# Patient Record
Sex: Male | Born: 1940
Health system: Southern US, Community
[De-identification: ages and names within clinical notes are randomized; demographics above are authoritative.]

## PROBLEM LIST (undated history)

## (undated) ENCOUNTER — Ambulatory Visit: Admission: EM | Payer: Medicare Other

## (undated) DIAGNOSIS — E785 Hyperlipidemia, unspecified: Secondary | ICD-10-CM

## (undated) DIAGNOSIS — E1159 Type 2 diabetes mellitus with other circulatory complications: Secondary | ICD-10-CM

## (undated) DIAGNOSIS — M47819 Spondylosis without myelopathy or radiculopathy, site unspecified: Secondary | ICD-10-CM

## (undated) DIAGNOSIS — I739 Peripheral vascular disease, unspecified: Secondary | ICD-10-CM

## (undated) DIAGNOSIS — N529 Male erectile dysfunction, unspecified: Secondary | ICD-10-CM

## (undated) DIAGNOSIS — I10 Essential (primary) hypertension: Secondary | ICD-10-CM

## (undated) DIAGNOSIS — L570 Actinic keratosis: Secondary | ICD-10-CM

## (undated) DIAGNOSIS — M1990 Unspecified osteoarthritis, unspecified site: Secondary | ICD-10-CM

## (undated) DIAGNOSIS — I2510 Atherosclerotic heart disease of native coronary artery without angina pectoris: Principal | ICD-10-CM

## (undated) HISTORY — DX: Male erectile dysfunction, unspecified: N52.9

## (undated) HISTORY — DX: Hyperlipidemia, unspecified: E78.5

## (undated) HISTORY — DX: Atherosclerotic heart disease of native coronary artery without angina pectoris: I25.10

## (undated) HISTORY — DX: Spondylosis without myelopathy or radiculopathy, site unspecified: M47.819

## (undated) HISTORY — DX: Peripheral vascular disease, unspecified: I73.9

## (undated) HISTORY — DX: Actinic keratosis: L57.0

## (undated) HISTORY — DX: Essential (primary) hypertension: I10

## (undated) HISTORY — DX: Type 2 diabetes mellitus with other circulatory complications: E11.59

---

## 2010-02-16 DIAGNOSIS — E1159 Type 2 diabetes mellitus with other circulatory complications: Secondary | ICD-10-CM

## 2010-02-16 HISTORY — DX: Type 2 diabetes mellitus with other circulatory complications: E11.59

## 2010-08-17 DIAGNOSIS — I2510 Atherosclerotic heart disease of native coronary artery without angina pectoris: Secondary | ICD-10-CM

## 2010-08-17 HISTORY — DX: Atherosclerotic heart disease of native coronary artery without angina pectoris: I25.10

## 2010-08-17 HISTORY — PX: US ECHOCARDIOGRAPHY: HXRAD669

## 2010-08-17 HISTORY — PX: CORONARY ARTERY BYPASS GRAFT: SHX141

## 2010-08-17 HISTORY — PX: ESOPHAGOGASTRODUODENOSCOPY: SHX1529

## 2010-08-22 ENCOUNTER — Ambulatory Visit: Payer: Medicare Other | Admitting: Internal Medicine

## 2010-09-02 LAB — BASIC METABOLIC PANEL
BUN: 7 mg/dL (ref 4–21)
CO2: 26 mmol/L
Calcium: 7.9 mg/dL
Creat: 0.8
Glucose: 121
Sodium: 134 mmol/L — AB (ref 137–147)

## 2010-09-19 ENCOUNTER — Ambulatory Visit (INDEPENDENT_AMBULATORY_CARE_PROVIDER_SITE_OTHER): Payer: Medicare Other | Admitting: Family Medicine

## 2010-09-19 ENCOUNTER — Encounter: Payer: Self-pay | Admitting: Family Medicine

## 2010-09-19 VITALS — BP 110/68 | HR 100 | Temp 98.0°F | Ht 68.0 in | Wt 179.0 lb

## 2010-09-19 DIAGNOSIS — I2510 Atherosclerotic heart disease of native coronary artery without angina pectoris: Secondary | ICD-10-CM | POA: Insufficient documentation

## 2010-09-19 DIAGNOSIS — E119 Type 2 diabetes mellitus without complications: Secondary | ICD-10-CM

## 2010-09-19 MED ORDER — METOPROLOL TARTRATE 12.5 MG HALF TABLET: 12.5000 mg | ORAL_TABLET | Freq: Two times a day (BID) | ORAL | Status: DC

## 2010-09-19 NOTE — Assessment & Plan Note (Addendum)
Recent CABG, healing wonderfully. Denies h/o HTN, actually endorses dizziness with 25mg  metoprolol bid so has been taking qd.  Will change to 12.5mg  metoprolol tart bid, advised to monitor BPs and call us if dizziness continued or staying too low. on lipitor 40mg  qhs.  Anticipate some component of dyslipidemia, will request and review records.  Doesn't sound like has ischemic CM/CHF component. No further changes today.

## 2010-09-19 NOTE — Progress Notes (Addendum)
Subjective:    Patient ID: William Walton, male    DOB: 1940/10/15, 70 y.o.   MRN: [id number]  HPI CC: new pt establish  [name] presents today to establish care.  Hasn't had regular doctor previously since [employer].  July 4th 2012 had MI, seen at [facility] ER and transferred to [facility] for 5v CABG.  3d later went home.  Found to be diabetic as well.  Doing great from pain standpoint, no chest pain, states had checkup again - EKG, blood work, CXR all good.  Has recovered very well.  Echo done while in hospital, told very little damage to heart.  Seen by Dr. Alan Ripper, to see again Aug 28.  On metoprolol 25mg , was told to take tid but caused bp to run too low so switched to bid, still dizzy, taking qd now and tolerating well.  DM - found to have during hospitalization.  Checks sugars twice daily, brings log and reviewed - am fasting 170s, pm prior to dinner 120-170.  followed by endocrinologist at [facility].  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  States had blood in stool at hospital, had EGD and told normal.  Preventative: No recent physical. No colonoscopy.  No prostate check.  Father had either prostate or bladder cancer.  States had prostate checked in [employer] and always fine. Tetanus done in hospital. No pneumonia or shingles shot. States flu shots give him the flu.  Medications and allergies reviewed and updated in chart. There is no problem list on file for this patient.  Past Medical History  Diagnosis Date  . MI (myocardial infarction) 2012  . Diabetes mellitus type II 2012   Past Surgical History  Procedure Date  . Coronary artery bypass graft 2012    5v   History  Substance Use Topics  . Smoking status: Former Smoker    Quit date: 02/16/1966  . Smokeless tobacco: Never Used  . Alcohol Use: No   Family History  Problem Relation Age of Onset  . Cancer Father 6    died of prostate or bladder CA, pt unsure  . Coronary artery disease Brother 59   bypass  . Hyperlipidemia Brother   . Hypertension Brother   . Coronary artery disease Maternal Uncle 59    MI  . Diabetes Neg Hx   . Stroke Neg Hx    No Known Allergies No current outpatient prescriptions on file prior to visit.   Review of Systems  Constitutional: Negative for fever, chills, activity change, appetite change, fatigue and unexpected weight change.  HENT: Negative for hearing loss and neck pain.   Eyes: Negative for visual disturbance.  Respiratory: Negative for cough, chest tightness, shortness of breath and wheezing.   Cardiovascular: Negative for chest pain, palpitations and leg swelling.  Gastrointestinal: Negative for nausea, vomiting, abdominal pain, diarrhea, constipation, blood in stool and abdominal distention.  Genitourinary: Negative for hematuria and difficulty urinating.  Musculoskeletal: Negative for myalgias and arthralgias.  Skin: Negative for rash.  Neurological: Negative for dizziness, seizures, syncope and headaches.  Hematological: Does not bruise/bleed easily.  Psychiatric/Behavioral: Negative for dysphoric mood. The patient is not nervous/anxious.        Objective:   Physical Exam  Nursing note and vitals reviewed. Constitutional: He is oriented to person, place, and time. He appears well-developed and well-nourished. No distress.  HENT:  Head: Normocephalic and atraumatic.  Right Ear: External ear normal.  Left Ear: External ear normal.  Nose: Nose normal.  Mouth/Throat: Oropharynx  is clear and moist.  Eyes: Conjunctivae and EOM are normal. Pupils are equal, round, and reactive to light.  Neck: Normal range of motion. Neck supple. No thyromegaly present.  Cardiovascular: Normal rate, regular rhythm, normal heart sounds and intact distal pulses.   No murmur heard. Pulses:      Radial pulses are 2+ on the right side, and 2+ on the left side.  Pulmonary/Chest: Effort normal and breath sounds normal. No respiratory distress. He has no  wheezes. He has no rales.       Midline healing scar from CABG, no erythema, induration  Abdominal: Soft. Bowel sounds are normal. He exhibits no distension and no mass. There is no tenderness. There is no rebound and no guarding.  Musculoskeletal: Normal range of motion. He exhibits no edema.  Lymphadenopathy:    He has no cervical adenopathy.  Neurological: He is alert and oriented to person, place, and time.       CN grossly intact, station and gait intact  Skin: Skin is warm and dry. No rash noted.  Psychiatric: He has a normal mood and affect. His behavior is normal. Judgment and thought content normal.          Assessment & Plan:

## 2010-09-19 NOTE — Assessment & Plan Note (Addendum)
Blood sugars above goal, recently started novolin 70/30, followed by endo. Will request records. Had tetanus shot at hospital. Due for pna/shingles at CPE. Provided with sheet on diabetes care.

## 2010-09-19 NOTE — Patient Instructions (Addendum)
Cut metoprolol 25mg  in half and take 1/2 pill twice daily.  Keep eye on blood pressure and let me know if running too low or dizziness coming back. You're due for physical so return at your convenience early sept or late august. I will request records from other providers and recent hospital stay and review. Check with insurance company about shingles shot and coverage and where they prefer you have it administered. Good to meet you today, call us with questions.

## 2010-09-29 ENCOUNTER — Encounter: Payer: Self-pay | Admitting: Family Medicine

## 2010-09-30 ENCOUNTER — Ambulatory Visit: Payer: Medicare Other

## 2010-10-05 ENCOUNTER — Encounter: Payer: Self-pay | Admitting: Family Medicine

## 2010-10-06 ENCOUNTER — Encounter: Payer: Self-pay | Admitting: Family Medicine

## 2010-10-08 ENCOUNTER — Other Ambulatory Visit: Payer: Self-pay | Admitting: Family Medicine

## 2010-10-08 DIAGNOSIS — Z125 Encounter for screening for malignant neoplasm of prostate: Secondary | ICD-10-CM

## 2010-10-08 DIAGNOSIS — E119 Type 2 diabetes mellitus without complications: Secondary | ICD-10-CM

## 2010-10-08 DIAGNOSIS — I2510 Atherosclerotic heart disease of native coronary artery without angina pectoris: Secondary | ICD-10-CM

## 2010-10-09 ENCOUNTER — Encounter: Payer: Self-pay | Admitting: Family Medicine

## 2010-10-17 ENCOUNTER — Other Ambulatory Visit (INDEPENDENT_AMBULATORY_CARE_PROVIDER_SITE_OTHER): Payer: Medicare Other

## 2010-10-17 DIAGNOSIS — Z125 Encounter for screening for malignant neoplasm of prostate: Secondary | ICD-10-CM

## 2010-10-17 DIAGNOSIS — I2510 Atherosclerotic heart disease of native coronary artery without angina pectoris: Secondary | ICD-10-CM

## 2010-10-17 DIAGNOSIS — E119 Type 2 diabetes mellitus without complications: Secondary | ICD-10-CM

## 2010-10-17 LAB — COMPREHENSIVE METABOLIC PANEL
ALT: 14 U/L (ref 0–53)
BUN: 13 mg/dL (ref 6–23)
CO2: 26 mEq/L (ref 19–32)
Calcium: 8.9 mg/dL (ref 8.4–10.5)
Chloride: 107 mEq/L (ref 96–112)
Creatinine, Ser: 0.9 mg/dL (ref 0.4–1.5)
GFR: 92.46 mL/min (ref >60.00)
Glucose, Bld: 140 mg/dL — ABNORMAL HIGH (ref 70–99)
Total Bilirubin: 0.7 mg/dL (ref 0.3–1.2)

## 2010-10-17 LAB — TSH: TSH: 1.57 u[IU]/mL (ref 0.35–5.50)

## 2010-10-17 LAB — LIPID PANEL
Cholesterol: 127 mg/dL (ref 0–200)
HDL: 28.2 mg/dL — ABNORMAL LOW (ref >39.00)
Triglycerides: 109 mg/dL (ref 0.0–149.0)

## 2010-10-18 ENCOUNTER — Ambulatory Visit: Payer: Medicare Other

## 2010-10-21 ENCOUNTER — Encounter: Payer: Self-pay | Admitting: Family Medicine

## 2010-10-21 ENCOUNTER — Ambulatory Visit (INDEPENDENT_AMBULATORY_CARE_PROVIDER_SITE_OTHER): Payer: Medicare Other | Admitting: Family Medicine

## 2010-10-21 VITALS — BP 130/80 | HR 96 | Temp 98.1°F | Ht 68.0 in | Wt 184.2 lb

## 2010-10-21 DIAGNOSIS — I10 Essential (primary) hypertension: Secondary | ICD-10-CM

## 2010-10-21 DIAGNOSIS — I2510 Atherosclerotic heart disease of native coronary artery without angina pectoris: Secondary | ICD-10-CM

## 2010-10-21 DIAGNOSIS — Z1211 Encounter for screening for malignant neoplasm of colon: Secondary | ICD-10-CM

## 2010-10-21 DIAGNOSIS — E119 Type 2 diabetes mellitus without complications: Secondary | ICD-10-CM

## 2010-10-21 DIAGNOSIS — Z0000 Encounter for general adult medical examination without abnormal findings: Secondary | ICD-10-CM | POA: Insufficient documentation

## 2010-10-21 DIAGNOSIS — N529 Male erectile dysfunction, unspecified: Secondary | ICD-10-CM

## 2010-10-21 DIAGNOSIS — E1169 Type 2 diabetes mellitus with other specified complication: Secondary | ICD-10-CM | POA: Insufficient documentation

## 2010-10-21 DIAGNOSIS — Z0110 Encounter for examination of ears and hearing without abnormal findings: Secondary | ICD-10-CM

## 2010-10-21 DIAGNOSIS — Z125 Encounter for screening for malignant neoplasm of prostate: Secondary | ICD-10-CM

## 2010-10-21 DIAGNOSIS — E785 Hyperlipidemia, unspecified: Secondary | ICD-10-CM

## 2010-10-21 DIAGNOSIS — Z0100 Encounter for examination of eyes and vision without abnormal findings: Secondary | ICD-10-CM

## 2010-10-21 MED ORDER — METOPROLOL TARTRATE 25 MG PO TABS: 12.5000 mg | ORAL_TABLET | Freq: Two times a day (BID) | ORAL | Status: DC

## 2010-10-21 MED ORDER — SILDENAFIL CITRATE 50 MG PO TABS: 50.0000 mg | ORAL_TABLET | Freq: Every day | ORAL | Status: DC | PRN

## 2010-10-21 MED ORDER — ZOSTER VACCINE LIVE 19400 UNT/0.65ML ~~LOC~~ SOLR: 0.6500 mL | Freq: Once | SUBCUTANEOUS | Status: DC

## 2010-10-21 NOTE — Patient Instructions (Addendum)
Pass by Marion's office to schedule colonoscopy for end of this year or early next year. I've sent shingles shot to your pharmacy - check with insurance and if administered there, let us know to update your chart. I'll send a copy of your blood work to endocrinologist. I would like [place] to hold off on viagra until 02/2011 (6 months from heart surgery). Good to see you today, call us with questions.

## 2010-10-21 NOTE — Assessment & Plan Note (Addendum)
I have personally reviewed the Medicare Annual Wellness questionnaire and have noted 1. The patient's medical and social history 2. Their use of alcohol, tobacco or illicit drugs 3. Their current medications and supplements 4. The patient's functional ability including ADL's, fall risks, home safety risks and hearing or visual             impairment. 5. Diet and physical activities 6. Evidence for depression or mood disorders  The patients weight, height, BMI and visual acuity have been recorded in the chart I have made referrals, counseling and provided education to the patient based review of the above and I have provided the pt with a written personalized care plan for preventive services.   Declines flu.  Shingles sent to pharmacy.  Currently declines pneumovax.  UTD tetanus.  Set up with colonoscopy.  Today PSA/DRE reassuring. Passed hearing, failed vision.  To [facility] doc and [facility] as pt endorses issue.Marland Kitchen No problems with memory or ADL/IADLs.  No recent falls.

## 2010-10-21 NOTE — Assessment & Plan Note (Signed)
Followed by endocrinology 

## 2010-10-21 NOTE — Assessment & Plan Note (Addendum)
BP Readings from Last 3 Encounters:  10/21/10 130/80  09/19/10 110/68  stable on current regimen. Continue. Orthostatics negative today.

## 2010-10-21 NOTE — Assessment & Plan Note (Signed)
Lab Results  Component Value Date   LDLCALC 77 10/17/2010  good control on current regimen.  Will fax results to endo.

## 2010-10-21 NOTE — Assessment & Plan Note (Signed)
Likely medication induced- however hesitant to d/c any of his current meds 2/2 CAD and recent 5v CABG. Exam normal today. Discussed viagra, would like him to be out farther from surgery and recent MI's. Likely will start treatment for ED 02/2011.  Advised check with cards, and if ok by them may start sooner.

## 2010-10-21 NOTE — Progress Notes (Signed)
Subjective:    Patient ID: William Walton, male    DOB: 11/06/1940, 70 y.o.   MRN: [id number]  HPI CC: physical  Has eye appt scheduled.  Having blurry vision.    Some dizziness with rapid standing.  Did cut metoprolol 1/2 tab bid for this. Having trouble with hearing.  [facility].  Having trouble with obtaining erection.  No changes in desire, orgasm.  Problems maintaining erection in last 6 mo, problems obtaining since heart attack and starting.  Brings log of BPs, ranging 110s to 130s systolic.  No improvement in dizziness after decreasing metoprolol.  Preventative:  No colonoscopy.  Did have EGD at [facility].  Would like colonoscopy. No recent prostate check.  Father had either prostate or bladder cancer.  States had prostate checked in [employer] and always fine. Tetanus done in hospital 08/2010. No shingles shot.  Would like. No pneumonia shot.  Declines this as well as flu currently (makes me sick). Has living will at home, is organ donor.  HCPOA is wife.  Medications and allergies reviewed and updated in chart.  Past histories reviewed and updated if relevant as below. Patient Active Problem List  Diagnoses  . CAD (coronary artery disease)  . Diabetes mellitus type II   Past Medical History  Diagnosis Date  . CAD (coronary artery disease) 2012    STEMI 08/2010 s/p 5v CABG at [facility]  . Diabetes mellitus type II 2012  . HTN (hypertension)   . HLD (hyperlipidemia)    Past Surgical History  Procedure Date  . Coronary artery bypass graft 08/2010    5v, [facility] Dr. Cheri Kearns  . US echocardiography 08/2010    mild LV dysfxn (EF 45%) with severe LVH, nl R vent sys fxn  . Esophagogastroduodenoscopy 08/2010    normal esophagus, localized gastric erythema   History  Substance Use Topics  . Smoking status: Former Smoker    Quit date: 02/16/1966  . Smokeless tobacco: Never Used  . Alcohol Use: No   Family History  Problem Relation Age of Onset  . Cancer Father 3      died of prostate or bladder CA, pt unsure  . Coronary artery disease Brother 59    bypass  . Hyperlipidemia Brother   . Hypertension Brother   . Coronary artery disease Maternal Uncle 59    MI  . Diabetes Neg Hx   . Stroke Neg Hx    No Known Allergies Current Outpatient Prescriptions on File Prior to Visit  Medication Sig Dispense Refill  . aspirin 81 MG tablet Take 81 mg by mouth daily.        Marland Kitchen atorvastatin (LIPITOR) 40 MG tablet Take 40 mg by mouth at bedtime.       . insulin NPH-insulin regular (NOVOLIN 70/30) (70-30) 100 UNIT/ML injection Inject into the skin as directed. 18 units in AM and 8 units in PM       Review of Systems  Constitutional: Negative for fever, chills, activity change, appetite change, fatigue and unexpected weight change.  HENT: Negative for hearing loss and neck pain.   Eyes: Positive for visual disturbance.  Respiratory: Negative for choking, chest tightness, shortness of breath and wheezing.   Cardiovascular: Negative for chest pain, palpitations and leg swelling.  Gastrointestinal: Negative for nausea, vomiting, abdominal pain, diarrhea, constipation, blood in stool and abdominal distention.  Genitourinary: Negative for hematuria and difficulty urinating.  Musculoskeletal: Negative for myalgias and arthralgias.  Skin: Negative for rash.  Neurological: Negative for dizziness, seizures, syncope and headaches.  Hematological: Does not bruise/bleed easily.  Psychiatric/Behavioral: Negative for dysphoric mood. The patient is not nervous/anxious.        Objective:   Physical Exam  Nursing note and vitals reviewed. Constitutional: He is oriented to person, place, and time. He appears well-developed and well-nourished. No distress.  HENT:  Head: Normocephalic and atraumatic.  Right Ear: External ear normal.  Left Ear: External ear normal.  Nose: Nose normal.  Mouth/Throat: Oropharynx is clear and moist. No oropharyngeal exudate.  Eyes: Conjunctivae  and EOM are normal. Pupils are equal, round, and reactive to light. No scleral icterus.  Neck: Normal range of motion. Neck supple. Carotid bruit is not present. No thyromegaly present.  Cardiovascular: Normal rate, regular rhythm, normal heart sounds and intact distal pulses.   No murmur heard. Pulses:      Radial pulses are 2+ on the right side, and 2+ on the left side.  Pulmonary/Chest: Effort normal and breath sounds normal. No respiratory distress. He has no wheezes. He has no rales.  Abdominal: Soft. Bowel sounds are normal. He exhibits no distension and no mass. There is no tenderness. There is no rebound and no guarding. Hernia confirmed negative in the right inguinal area and confirmed negative in the left inguinal area.  Genitourinary: Rectum normal, prostate normal, testes normal and penis normal. Rectal exam shows no external hemorrhoid and no fissure. Guaiac negative stool. Prostate is not enlarged and not tender. Right testis shows no mass and no tenderness. Left testis shows no mass and no tenderness. Circumcised. No penile tenderness.  Musculoskeletal: Normal range of motion. He exhibits no edema.  Lymphadenopathy:    He has no cervical adenopathy.       Right: No inguinal adenopathy present.       Left: No inguinal adenopathy present.  Neurological: He is alert and oriented to person, place, and time.       CN grossly intact, station and gait intact  Skin: Skin is warm and dry. No rash noted.  Psychiatric: He has a normal mood and affect. His behavior is normal. Judgment and thought content normal.          Assessment & Plan:

## 2010-10-21 NOTE — Assessment & Plan Note (Signed)
Stable.  1+ mo out from surgery.

## 2010-10-29 ENCOUNTER — Ambulatory Visit: Payer: Medicare Other | Admitting: Internal Medicine

## 2010-10-30 ENCOUNTER — Encounter: Payer: Medicare Other | Admitting: Family Medicine

## 2010-11-17 ENCOUNTER — Ambulatory Visit: Payer: Medicare Other

## 2010-11-17 ENCOUNTER — Encounter: Payer: Medicare Other | Admitting: Family Medicine

## 2010-11-17 HISTORY — PX: CATARACT EXTRACTION: SUR2

## 2010-12-18 ENCOUNTER — Encounter: Payer: Medicare Other | Admitting: Family Medicine

## 2010-12-26 ENCOUNTER — Encounter: Payer: Self-pay | Admitting: Family Medicine

## 2011-01-01 ENCOUNTER — Encounter: Payer: Self-pay | Admitting: Family Medicine

## 2011-01-01 ENCOUNTER — Ambulatory Visit (INDEPENDENT_AMBULATORY_CARE_PROVIDER_SITE_OTHER): Payer: Medicare Other | Admitting: Family Medicine

## 2011-01-01 ENCOUNTER — Ambulatory Visit (INDEPENDENT_AMBULATORY_CARE_PROVIDER_SITE_OTHER)
Admission: RE | Admit: 2011-01-01 | Discharge: 2011-01-01 | Disposition: A | Discharge status: Home / Self Care | Payer: Medicare Other | Source: Ambulatory Visit | Attending: Family Medicine | Admitting: Family Medicine

## 2011-01-01 VITALS — BP 118/70 | HR 92 | Temp 98.4°F | Wt 184.2 lb

## 2011-01-01 DIAGNOSIS — R05 Cough: Secondary | ICD-10-CM

## 2011-01-01 DIAGNOSIS — J209 Acute bronchitis, unspecified: Secondary | ICD-10-CM | POA: Insufficient documentation

## 2011-01-01 MED ORDER — LEVOFLOXACIN 500 MG PO TABS: 500.0000 mg | ORAL_TABLET | Freq: Every day | ORAL | Status: AC

## 2011-01-01 NOTE — Assessment & Plan Note (Addendum)
Concern for PNA given lung findings and story. CXR - overall clear Given exam findings, treat with levaquin daily for 10 days. Update [place] if not improving as expected.

## 2011-01-01 NOTE — Patient Instructions (Signed)
I am worried about pneumonia - treat with levaquin daily for 10 days Small sips throughout the day as well as plenty of rest. Please return or let us know if fever >101.5, worsening cough despite antibiotics, or any trouble breathing.  Pneumonia, Adult Pneumonia is an infection of the lungs.  CAUSES Pneumonia may be caused by bacteria or a virus. Usually, these infections are caused by breathing infectious particles into the lungs (respiratory tract). SYMPTOMS   Cough.   Fever.   Chest pain.   Increased rate of breathing.   Wheezing.   Mucus production.  DIAGNOSIS  If you have the common symptoms of pneumonia, your caregiver will typically confirm the diagnosis with a chest X-ray. The X-ray will show an abnormality in the lung (pulmonary infiltrate) if you have pneumonia. Other tests of your blood, urine, or sputum may be done to find the specific cause of your pneumonia. Your caregiver may also do tests (blood gases or pulse oximetry) to see how well your lungs are working. TREATMENT  Some forms of pneumonia may be spread to other people when you cough or sneeze. You may be asked to wear a mask before and during your exam. Pneumonia that is caused by bacteria is treated with antibiotic medicine. Pneumonia that is caused by the influenza virus may be treated with an antiviral medicine. Most other viral infections must run their course. These infections will not respond to antibiotics.  PREVENTION A pneumococcal shot (vaccine) is available to prevent a common bacterial cause of pneumonia. This is usually suggested for:  People over 73 years old.   Patients on chemotherapy.   People with chronic lung problems, such as bronchitis or emphysema.   People with immune system problems.  If you are over 65 or have a high risk condition, you may receive the pneumococcal vaccine if you have not received it before. In some countries, a routine influenza vaccine is also recommended. This  vaccine can help prevent some cases of pneumonia.You may be offered the influenza vaccine as part of your care. If you smoke, it is time to quit. You may receive instructions on how to stop smoking. Your caregiver can provide medicines and counseling to help you quit. HOME CARE INSTRUCTIONS   Cough suppressants may be used if you are losing too much rest. However, coughing protects you by clearing your lungs. You should avoid using cough suppressants if you can.   Your caregiver may have prescribed medicine if he or she thinks your pneumonia is caused by a bacteria or influenza. Finish your medicine even if you start to feel better.   Your caregiver may also prescribe an expectorant. This loosens the mucus to be coughed up.   Only take over-the-counter or prescription medicines for pain, discomfort, or fever as directed by your caregiver.   Do not smoke. Smoking is a common cause of bronchitis and can contribute to pneumonia. If you are a smoker and continue to smoke, your cough may last several weeks after your pneumonia has cleared.   A cold steam vaporizer or humidifier in your room or home may help loosen mucus.   Coughing is often worse at night. Sleeping in a semi-upright position in a recliner or using a couple pillows under your head will help with this.   Get rest as you feel it is needed. Your body will usually let you know when you need to rest.  SEEK IMMEDIATE MEDICAL CARE IF:   Your illness becomes worse. This is  especially true if you are elderly or weakened from any other disease.   You cannot control your cough with suppressants and are losing sleep.   You begin coughing up blood.   You develop pain which is getting worse or is uncontrolled with medicines.   You have a fever.   Any of the symptoms which initially brought you in for treatment are getting worse rather than better.   You develop shortness of breath or chest pain.  MAKE SURE YOU:   Understand these  instructions.   Will watch your condition.   Will get help right away if you are not doing well or get worse.  Document Released: 02/02/2005 Document Revised: 10/15/2010 Document Reviewed: 04/24/2010 [facility] Patient Information 2012 [place], [place].

## 2011-01-01 NOTE — Progress Notes (Signed)
  Subjective:    Patient ID: William Walton, male    DOB: 1941-02-07, 70 y.o.   MRN: [id number]  HPI CC: cough, fever  7d h/o fever, headaches, chills, started taking tussin DM.  Initially improved.  Then 3d ago worsening feverish esp at night, continued dry cough.  Cough worse at night - coughing fits.  + ST and chills at night.  Decreased appeitet and joint pains (knees).  Currently mildly lightheaded.  No abd pain, n/v, myalgias, ear pain or tooth pain.  No SOB, chest pain.  No smokers at home.  + daughter sick recently. No h/o asthma/COPD.  Review of Systems Per HPI    Objective:   Physical Exam  Constitutional: He appears well-developed and well-nourished. No distress.  HENT:  Head: Normocephalic and atraumatic.  Right Ear: Hearing, tympanic membrane, external ear and ear canal normal.  Left Ear: Hearing, tympanic membrane, external ear and ear canal normal.  Nose: Nose normal. No mucosal edema or rhinorrhea. Right sinus exhibits no maxillary sinus tenderness and no frontal sinus tenderness. Left sinus exhibits no maxillary sinus tenderness and no frontal sinus tenderness.  Mouth/Throat: Uvula is midline, oropharynx is clear and moist and mucous membranes are normal. No oropharyngeal exudate, posterior oropharyngeal edema, posterior oropharyngeal erythema or tonsillar abscesses.  Eyes: Conjunctivae and EOM are normal. Pupils are equal, round, and reactive to light. No scleral icterus.  Neck: Normal range of motion. Neck supple. No thyromegaly present.  Cardiovascular: Normal rate, regular rhythm, normal heart sounds and intact distal pulses.   No murmur heard. Pulmonary/Chest: Effort normal. No respiratory distress. He has no wheezes. He has rales in the right middle field and the right lower field.  Musculoskeletal: He exhibits no edema.  Lymphadenopathy:    He has no cervical adenopathy.  Skin: Skin is warm and dry. No rash noted.  Psychiatric: He has a normal mood and affect.        Assessment & Plan:

## 2011-02-20 DIAGNOSIS — E119 Type 2 diabetes mellitus without complications: Secondary | ICD-10-CM | POA: Diagnosis not present

## 2011-02-27 ENCOUNTER — Encounter: Payer: Self-pay | Admitting: Family Medicine

## 2011-02-27 DIAGNOSIS — E119 Type 2 diabetes mellitus without complications: Secondary | ICD-10-CM | POA: Diagnosis not present

## 2011-04-16 DIAGNOSIS — R0789 Other chest pain: Secondary | ICD-10-CM | POA: Diagnosis not present

## 2011-04-16 DIAGNOSIS — I2510 Atherosclerotic heart disease of native coronary artery without angina pectoris: Secondary | ICD-10-CM | POA: Diagnosis not present

## 2011-05-22 DIAGNOSIS — E119 Type 2 diabetes mellitus without complications: Secondary | ICD-10-CM | POA: Diagnosis not present

## 2011-05-29 DIAGNOSIS — E119 Type 2 diabetes mellitus without complications: Secondary | ICD-10-CM | POA: Diagnosis not present

## 2011-05-31 ENCOUNTER — Encounter: Payer: Self-pay | Admitting: Family Medicine

## 2011-11-03 DIAGNOSIS — I080 Rheumatic disorders of both mitral and aortic valves: Secondary | ICD-10-CM | POA: Diagnosis not present

## 2011-11-19 DIAGNOSIS — E119 Type 2 diabetes mellitus without complications: Secondary | ICD-10-CM | POA: Diagnosis not present

## 2011-11-19 DIAGNOSIS — I10 Essential (primary) hypertension: Secondary | ICD-10-CM | POA: Diagnosis not present

## 2011-11-26 DIAGNOSIS — E1149 Type 2 diabetes mellitus with other diabetic neurological complication: Secondary | ICD-10-CM | POA: Diagnosis not present

## 2011-11-26 DIAGNOSIS — R5381 Other malaise: Secondary | ICD-10-CM | POA: Diagnosis not present

## 2011-11-26 DIAGNOSIS — R5383 Other fatigue: Secondary | ICD-10-CM | POA: Diagnosis not present

## 2011-11-26 DIAGNOSIS — G909 Disorder of the autonomic nervous system, unspecified: Secondary | ICD-10-CM | POA: Diagnosis not present

## 2012-02-24 DIAGNOSIS — G909 Disorder of the autonomic nervous system, unspecified: Secondary | ICD-10-CM | POA: Diagnosis not present

## 2012-02-24 DIAGNOSIS — E1149 Type 2 diabetes mellitus with other diabetic neurological complication: Secondary | ICD-10-CM | POA: Diagnosis not present

## 2012-03-03 DIAGNOSIS — E7800 Pure hypercholesterolemia, unspecified: Secondary | ICD-10-CM | POA: Diagnosis not present

## 2012-03-03 DIAGNOSIS — I10 Essential (primary) hypertension: Secondary | ICD-10-CM | POA: Diagnosis not present

## 2012-04-25 ENCOUNTER — Encounter: Payer: Self-pay | Admitting: Family Medicine

## 2012-04-25 ENCOUNTER — Ambulatory Visit (INDEPENDENT_AMBULATORY_CARE_PROVIDER_SITE_OTHER): Payer: Medicare Other | Admitting: Family Medicine

## 2012-04-25 VITALS — BP 128/82 | HR 76 | Temp 98.3°F | Wt 195.5 lb

## 2012-04-25 DIAGNOSIS — J209 Acute bronchitis, unspecified: Secondary | ICD-10-CM | POA: Diagnosis not present

## 2012-04-25 MED ORDER — AMOXICILLIN-POT CLAVULANATE 875-125 MG PO TABS: 1.0000 | ORAL_TABLET | Freq: Two times a day (BID) | ORAL | Status: AC

## 2012-04-25 MED ORDER — GUAIFENESIN-CODEINE 100-10 MG/5ML PO SYRP: 5.0000 mL | ORAL_SOLUTION | Freq: Two times a day (BID) | ORAL | Status: DC | PRN

## 2012-04-25 NOTE — Progress Notes (Signed)
  Subjective:    Patient ID: William Walton, male    DOB: 06/02/1940, 72 y.o.   MRN: [id number]  HPI CC: cough  1 wk h/o cough worse with drainage.  Initially with fever but that has resolved.  + PNdrainage.  + rhinorrhea, head congestion.  Dry cough. Night time coughing fits.  White mucous.  No abd pain, nausea/vomiting, ear or tooth pain, HA.  So far has tried decongestant and OTC diabetic cough syrup.    No smokers at home. No sick contacts at home. No h/o asthma/COPD. Did receive flu and PNA shot this past year (at [place]).  H/o HTN, HLD, DM - followd by Dr. Tedd Sias.  Cardiologist Dr. Elijah Birk.  Past Medical History  Diagnosis Date  . CAD (coronary artery disease) 08/2010    STEMI 08/2010 s/p 5v CABG at [facility], cardiac rehab - only did 7/26 sessions  . Diabetes mellitus type II 08/2010    followed by Dr. Tedd Sias [facility]), no retinopathy Clydene Pugh)  . HTN (hypertension)   . HLD (hyperlipidemia)   . ED (erectile dysfunction)     likely medication induced     Review of Systems Per HPI    Objective:   Physical Exam  Nursing note and vitals reviewed. Constitutional: He appears well-developed and well-nourished. No distress.  HENT:  Head: Normocephalic and atraumatic.  Right Ear: Hearing, tympanic membrane, external ear and ear canal normal.  Left Ear: Hearing, tympanic membrane, external ear and ear canal normal.  Nose: Nose normal. No mucosal edema or rhinorrhea. Right sinus exhibits no maxillary sinus tenderness and no frontal sinus tenderness. Left sinus exhibits no maxillary sinus tenderness and no frontal sinus tenderness.  Mouth/Throat: Uvula is midline and oropharynx is clear and moist. No oropharyngeal exudate, posterior oropharyngeal edema, posterior oropharyngeal erythema or tonsillar abscesses.  Nasal congestion  Eyes: Conjunctivae and EOM are normal. Pupils are equal, round, and reactive to light. No scleral icterus.  Neck: Normal range of motion. Neck supple.  Cardiovascular:  Normal rate, regular rhythm, normal heart sounds and intact distal pulses.   No murmur heard. Pulmonary/Chest: Effort normal and breath sounds normal. No respiratory distress. He has no wheezes. He has no rales.  Faint rhonchi RLL  Lymphadenopathy:    He has no cervical adenopathy.  Skin: Skin is warm and dry. No rash noted.       Assessment & Plan:

## 2012-04-25 NOTE — Assessment & Plan Note (Signed)
with sinusitis. Treat aggressively with augmentin course. Update if sxs persist or fail to improve cheratussin for cough at night. See pt instructions.

## 2012-04-25 NOTE — Patient Instructions (Signed)
Take medicine as prescribed: augmentin twice daily.  May use cheratussin (codeine cough syrup) for cough at night. You likely have bronchitis as well as bit of sinusitis Push fluids and plenty of rest. Nasal saline irrigation or neti pot to help drain sinuses. Let us know if fever >101.5, trouble opening/closing mouth, difficulty swallowing, or worsening - you may need to be seen again.

## 2012-06-08 DIAGNOSIS — E7800 Pure hypercholesterolemia, unspecified: Secondary | ICD-10-CM | POA: Diagnosis not present

## 2012-06-08 DIAGNOSIS — G909 Disorder of the autonomic nervous system, unspecified: Secondary | ICD-10-CM | POA: Diagnosis not present

## 2012-06-08 DIAGNOSIS — I10 Essential (primary) hypertension: Secondary | ICD-10-CM | POA: Diagnosis not present

## 2012-06-08 DIAGNOSIS — E1149 Type 2 diabetes mellitus with other diabetic neurological complication: Secondary | ICD-10-CM | POA: Diagnosis not present

## 2012-07-06 DIAGNOSIS — M1710 Unilateral primary osteoarthritis, unspecified knee: Secondary | ICD-10-CM | POA: Diagnosis not present

## 2012-07-12 ENCOUNTER — Ambulatory Visit: Payer: Self-pay | Admitting: Orthopedic Surgery

## 2012-07-12 DIAGNOSIS — IMO0002 Reserved for concepts with insufficient information to code with codable children: Secondary | ICD-10-CM | POA: Diagnosis not present

## 2012-07-12 DIAGNOSIS — M1710 Unilateral primary osteoarthritis, unspecified knee: Secondary | ICD-10-CM | POA: Diagnosis not present

## 2012-07-14 DIAGNOSIS — H40019 Open angle with borderline findings, low risk, unspecified eye: Secondary | ICD-10-CM | POA: Diagnosis not present

## 2012-07-18 DIAGNOSIS — I10 Essential (primary) hypertension: Secondary | ICD-10-CM | POA: Diagnosis not present

## 2012-07-18 DIAGNOSIS — E119 Type 2 diabetes mellitus without complications: Secondary | ICD-10-CM | POA: Diagnosis not present

## 2012-07-18 DIAGNOSIS — I209 Angina pectoris, unspecified: Secondary | ICD-10-CM | POA: Diagnosis not present

## 2012-07-18 DIAGNOSIS — I2510 Atherosclerotic heart disease of native coronary artery without angina pectoris: Secondary | ICD-10-CM | POA: Diagnosis not present

## 2012-08-01 ENCOUNTER — Ambulatory Visit: Payer: Self-pay | Admitting: Orthopedic Surgery

## 2012-08-01 DIAGNOSIS — I252 Old myocardial infarction: Secondary | ICD-10-CM | POA: Diagnosis not present

## 2012-08-01 DIAGNOSIS — E7800 Pure hypercholesterolemia, unspecified: Secondary | ICD-10-CM | POA: Diagnosis not present

## 2012-08-01 DIAGNOSIS — IMO0002 Reserved for concepts with insufficient information to code with codable children: Secondary | ICD-10-CM | POA: Diagnosis not present

## 2012-08-01 DIAGNOSIS — I2510 Atherosclerotic heart disease of native coronary artery without angina pectoris: Secondary | ICD-10-CM | POA: Diagnosis not present

## 2012-08-01 DIAGNOSIS — D62 Acute posthemorrhagic anemia: Secondary | ICD-10-CM | POA: Diagnosis not present

## 2012-08-01 DIAGNOSIS — E119 Type 2 diabetes mellitus without complications: Secondary | ICD-10-CM | POA: Diagnosis not present

## 2012-08-01 DIAGNOSIS — I10 Essential (primary) hypertension: Secondary | ICD-10-CM | POA: Diagnosis not present

## 2012-08-01 DIAGNOSIS — Z01812 Encounter for preprocedural laboratory examination: Secondary | ICD-10-CM | POA: Diagnosis not present

## 2012-08-01 LAB — PROTIME-INR: Prothrombin Time: 12.8 secs (ref 11.5–14.7)

## 2012-08-01 LAB — URINALYSIS, COMPLETE
Bacteria: NONE SEEN
Bilirubin,UR: NEGATIVE
Blood: NEGATIVE
Glucose,UR: NEGATIVE mg/dL (ref 0–75)
Ketone: NEGATIVE
Ph: 5 (ref 4.5–8.0)
Protein: NEGATIVE
Specific Gravity: 1.018 (ref 1.003–1.030)
WBC UR: 1 /HPF (ref 0–5)

## 2012-08-01 LAB — BASIC METABOLIC PANEL
Anion Gap: 6 — ABNORMAL LOW (ref 7–16)
Calcium, Total: 9 mg/dL (ref 8.5–10.1)
Chloride: 105 mmol/L (ref 98–107)
Co2: 25 mmol/L (ref 21–32)
EGFR (Non-African Amer.): >60
Osmolality: 277 (ref 275–301)
Potassium: 4.6 mmol/L (ref 3.5–5.1)
Sodium: 136 mmol/L (ref 136–145)

## 2012-08-01 LAB — CBC
HCT: 41.9 % (ref 40.0–52.0)
HGB: 14.4 g/dL (ref 13.0–18.0)
Platelet: 239 10*3/uL (ref 150–440)
RBC: 4.98 10*6/uL (ref 4.40–5.90)
RDW: 13.6 % (ref 11.5–14.5)
WBC: 8 10*3/uL (ref 3.8–10.6)

## 2012-08-01 LAB — APTT: Activated PTT: 33.7 secs (ref 23.6–35.9)

## 2012-08-01 LAB — SEDIMENTATION RATE: Erythrocyte Sed Rate: 12 mm/hr (ref 0–20)

## 2012-08-12 DIAGNOSIS — I2510 Atherosclerotic heart disease of native coronary artery without angina pectoris: Secondary | ICD-10-CM | POA: Diagnosis not present

## 2012-08-12 DIAGNOSIS — R5383 Other fatigue: Secondary | ICD-10-CM | POA: Diagnosis not present

## 2012-08-12 DIAGNOSIS — R5381 Other malaise: Secondary | ICD-10-CM | POA: Diagnosis not present

## 2012-08-12 DIAGNOSIS — R0602 Shortness of breath: Secondary | ICD-10-CM | POA: Diagnosis not present

## 2012-08-16 ENCOUNTER — Inpatient Hospital Stay: Payer: Self-pay | Admitting: Orthopedic Surgery

## 2012-08-16 DIAGNOSIS — E7800 Pure hypercholesterolemia, unspecified: Secondary | ICD-10-CM | POA: Diagnosis present

## 2012-08-16 DIAGNOSIS — Z96659 Presence of unspecified artificial knee joint: Secondary | ICD-10-CM | POA: Diagnosis not present

## 2012-08-16 DIAGNOSIS — G8918 Other acute postprocedural pain: Secondary | ICD-10-CM | POA: Diagnosis not present

## 2012-08-16 DIAGNOSIS — I2510 Atherosclerotic heart disease of native coronary artery without angina pectoris: Secondary | ICD-10-CM | POA: Diagnosis present

## 2012-08-16 DIAGNOSIS — M1710 Unilateral primary osteoarthritis, unspecified knee: Secondary | ICD-10-CM | POA: Diagnosis present

## 2012-08-16 DIAGNOSIS — M25569 Pain in unspecified knee: Secondary | ICD-10-CM | POA: Diagnosis not present

## 2012-08-16 DIAGNOSIS — D62 Acute posthemorrhagic anemia: Secondary | ICD-10-CM | POA: Diagnosis present

## 2012-08-16 DIAGNOSIS — E119 Type 2 diabetes mellitus without complications: Secondary | ICD-10-CM | POA: Diagnosis present

## 2012-08-16 DIAGNOSIS — Z4789 Encounter for other orthopedic aftercare: Secondary | ICD-10-CM | POA: Diagnosis not present

## 2012-08-16 DIAGNOSIS — I252 Old myocardial infarction: Secondary | ICD-10-CM | POA: Diagnosis not present

## 2012-08-16 DIAGNOSIS — I10 Essential (primary) hypertension: Secondary | ICD-10-CM | POA: Diagnosis present

## 2012-08-16 HISTORY — PX: TOTAL KNEE ARTHROPLASTY: SHX125

## 2012-08-17 LAB — BASIC METABOLIC PANEL
Anion Gap: 5 — ABNORMAL LOW (ref 7–16)
Co2: 27 mmol/L (ref 21–32)
Creatinine: 1.09 mg/dL (ref 0.60–1.30)
EGFR (Non-African Amer.): >60
Glucose: 210 mg/dL — ABNORMAL HIGH (ref 65–99)
Osmolality: 284 (ref 275–301)
Sodium: 139 mmol/L (ref 136–145)

## 2012-08-20 DIAGNOSIS — Z471 Aftercare following joint replacement surgery: Secondary | ICD-10-CM | POA: Diagnosis not present

## 2012-08-20 DIAGNOSIS — Z7901 Long term (current) use of anticoagulants: Secondary | ICD-10-CM | POA: Diagnosis not present

## 2012-08-20 DIAGNOSIS — E119 Type 2 diabetes mellitus without complications: Secondary | ICD-10-CM | POA: Diagnosis not present

## 2012-08-20 DIAGNOSIS — Z96659 Presence of unspecified artificial knee joint: Secondary | ICD-10-CM | POA: Diagnosis not present

## 2012-08-20 DIAGNOSIS — IMO0001 Reserved for inherently not codable concepts without codable children: Secondary | ICD-10-CM | POA: Diagnosis not present

## 2012-08-22 DIAGNOSIS — Z96659 Presence of unspecified artificial knee joint: Secondary | ICD-10-CM | POA: Diagnosis not present

## 2012-08-22 DIAGNOSIS — Z471 Aftercare following joint replacement surgery: Secondary | ICD-10-CM | POA: Diagnosis not present

## 2012-08-22 DIAGNOSIS — E119 Type 2 diabetes mellitus without complications: Secondary | ICD-10-CM | POA: Diagnosis not present

## 2012-08-22 DIAGNOSIS — Z7901 Long term (current) use of anticoagulants: Secondary | ICD-10-CM | POA: Diagnosis not present

## 2012-08-22 DIAGNOSIS — IMO0001 Reserved for inherently not codable concepts without codable children: Secondary | ICD-10-CM | POA: Diagnosis not present

## 2012-08-23 DIAGNOSIS — E119 Type 2 diabetes mellitus without complications: Secondary | ICD-10-CM | POA: Diagnosis not present

## 2012-08-23 DIAGNOSIS — IMO0001 Reserved for inherently not codable concepts without codable children: Secondary | ICD-10-CM | POA: Diagnosis not present

## 2012-08-23 DIAGNOSIS — Z96659 Presence of unspecified artificial knee joint: Secondary | ICD-10-CM | POA: Diagnosis not present

## 2012-08-23 DIAGNOSIS — Z7901 Long term (current) use of anticoagulants: Secondary | ICD-10-CM | POA: Diagnosis not present

## 2012-08-23 DIAGNOSIS — Z471 Aftercare following joint replacement surgery: Secondary | ICD-10-CM | POA: Diagnosis not present

## 2012-08-24 DIAGNOSIS — Z96659 Presence of unspecified artificial knee joint: Secondary | ICD-10-CM | POA: Diagnosis not present

## 2012-08-24 DIAGNOSIS — Z471 Aftercare following joint replacement surgery: Secondary | ICD-10-CM | POA: Diagnosis not present

## 2012-08-24 DIAGNOSIS — E119 Type 2 diabetes mellitus without complications: Secondary | ICD-10-CM | POA: Diagnosis not present

## 2012-08-24 DIAGNOSIS — IMO0001 Reserved for inherently not codable concepts without codable children: Secondary | ICD-10-CM | POA: Diagnosis not present

## 2012-08-24 DIAGNOSIS — Z7901 Long term (current) use of anticoagulants: Secondary | ICD-10-CM | POA: Diagnosis not present

## 2012-08-25 ENCOUNTER — Other Ambulatory Visit: Payer: Self-pay

## 2012-08-25 DIAGNOSIS — Z7901 Long term (current) use of anticoagulants: Secondary | ICD-10-CM | POA: Diagnosis not present

## 2012-08-25 DIAGNOSIS — Z471 Aftercare following joint replacement surgery: Secondary | ICD-10-CM | POA: Diagnosis not present

## 2012-08-25 DIAGNOSIS — E119 Type 2 diabetes mellitus without complications: Secondary | ICD-10-CM | POA: Diagnosis not present

## 2012-08-25 DIAGNOSIS — IMO0001 Reserved for inherently not codable concepts without codable children: Secondary | ICD-10-CM | POA: Diagnosis not present

## 2012-08-25 DIAGNOSIS — Z96659 Presence of unspecified artificial knee joint: Secondary | ICD-10-CM | POA: Diagnosis not present

## 2012-08-26 DIAGNOSIS — E119 Type 2 diabetes mellitus without complications: Secondary | ICD-10-CM | POA: Diagnosis not present

## 2012-08-26 DIAGNOSIS — Z471 Aftercare following joint replacement surgery: Secondary | ICD-10-CM | POA: Diagnosis not present

## 2012-08-26 DIAGNOSIS — Z7901 Long term (current) use of anticoagulants: Secondary | ICD-10-CM | POA: Diagnosis not present

## 2012-08-26 DIAGNOSIS — Z96659 Presence of unspecified artificial knee joint: Secondary | ICD-10-CM | POA: Diagnosis not present

## 2012-08-26 DIAGNOSIS — IMO0001 Reserved for inherently not codable concepts without codable children: Secondary | ICD-10-CM | POA: Diagnosis not present

## 2012-08-29 DIAGNOSIS — IMO0001 Reserved for inherently not codable concepts without codable children: Secondary | ICD-10-CM | POA: Diagnosis not present

## 2012-08-29 DIAGNOSIS — E119 Type 2 diabetes mellitus without complications: Secondary | ICD-10-CM | POA: Diagnosis not present

## 2012-08-29 DIAGNOSIS — Z96659 Presence of unspecified artificial knee joint: Secondary | ICD-10-CM | POA: Diagnosis not present

## 2012-08-29 DIAGNOSIS — Z7901 Long term (current) use of anticoagulants: Secondary | ICD-10-CM | POA: Diagnosis not present

## 2012-08-29 DIAGNOSIS — Z471 Aftercare following joint replacement surgery: Secondary | ICD-10-CM | POA: Diagnosis not present

## 2012-08-30 DIAGNOSIS — Z7901 Long term (current) use of anticoagulants: Secondary | ICD-10-CM | POA: Diagnosis not present

## 2012-08-30 DIAGNOSIS — Z96659 Presence of unspecified artificial knee joint: Secondary | ICD-10-CM | POA: Diagnosis not present

## 2012-08-30 DIAGNOSIS — IMO0001 Reserved for inherently not codable concepts without codable children: Secondary | ICD-10-CM | POA: Diagnosis not present

## 2012-08-30 DIAGNOSIS — Z471 Aftercare following joint replacement surgery: Secondary | ICD-10-CM | POA: Diagnosis not present

## 2012-08-30 DIAGNOSIS — E119 Type 2 diabetes mellitus without complications: Secondary | ICD-10-CM | POA: Diagnosis not present

## 2012-08-31 DIAGNOSIS — M6281 Muscle weakness (generalized): Secondary | ICD-10-CM | POA: Diagnosis not present

## 2012-08-31 DIAGNOSIS — M25669 Stiffness of unspecified knee, not elsewhere classified: Secondary | ICD-10-CM | POA: Diagnosis not present

## 2012-08-31 DIAGNOSIS — Z96659 Presence of unspecified artificial knee joint: Secondary | ICD-10-CM | POA: Diagnosis not present

## 2012-08-31 DIAGNOSIS — M25569 Pain in unspecified knee: Secondary | ICD-10-CM | POA: Diagnosis not present

## 2012-09-02 DIAGNOSIS — M6281 Muscle weakness (generalized): Secondary | ICD-10-CM | POA: Diagnosis not present

## 2012-09-02 DIAGNOSIS — Z96659 Presence of unspecified artificial knee joint: Secondary | ICD-10-CM | POA: Diagnosis not present

## 2012-09-02 DIAGNOSIS — M25569 Pain in unspecified knee: Secondary | ICD-10-CM | POA: Diagnosis not present

## 2012-09-02 DIAGNOSIS — M25669 Stiffness of unspecified knee, not elsewhere classified: Secondary | ICD-10-CM | POA: Diagnosis not present

## 2012-09-05 DIAGNOSIS — M6281 Muscle weakness (generalized): Secondary | ICD-10-CM | POA: Diagnosis not present

## 2012-09-05 DIAGNOSIS — Z96659 Presence of unspecified artificial knee joint: Secondary | ICD-10-CM | POA: Diagnosis not present

## 2012-09-05 DIAGNOSIS — M25569 Pain in unspecified knee: Secondary | ICD-10-CM | POA: Diagnosis not present

## 2012-09-05 DIAGNOSIS — M25669 Stiffness of unspecified knee, not elsewhere classified: Secondary | ICD-10-CM | POA: Diagnosis not present

## 2012-09-07 DIAGNOSIS — M25669 Stiffness of unspecified knee, not elsewhere classified: Secondary | ICD-10-CM | POA: Diagnosis not present

## 2012-09-07 DIAGNOSIS — M25569 Pain in unspecified knee: Secondary | ICD-10-CM | POA: Diagnosis not present

## 2012-09-07 DIAGNOSIS — M6281 Muscle weakness (generalized): Secondary | ICD-10-CM | POA: Diagnosis not present

## 2012-09-07 DIAGNOSIS — Z96659 Presence of unspecified artificial knee joint: Secondary | ICD-10-CM | POA: Diagnosis not present

## 2012-09-09 DIAGNOSIS — Z96659 Presence of unspecified artificial knee joint: Secondary | ICD-10-CM | POA: Diagnosis not present

## 2012-09-09 DIAGNOSIS — M25569 Pain in unspecified knee: Secondary | ICD-10-CM | POA: Diagnosis not present

## 2012-09-09 DIAGNOSIS — M6281 Muscle weakness (generalized): Secondary | ICD-10-CM | POA: Diagnosis not present

## 2012-09-09 DIAGNOSIS — M25669 Stiffness of unspecified knee, not elsewhere classified: Secondary | ICD-10-CM | POA: Diagnosis not present

## 2012-09-12 DIAGNOSIS — Z96659 Presence of unspecified artificial knee joint: Secondary | ICD-10-CM | POA: Diagnosis not present

## 2012-09-12 DIAGNOSIS — M25669 Stiffness of unspecified knee, not elsewhere classified: Secondary | ICD-10-CM | POA: Diagnosis not present

## 2012-09-12 DIAGNOSIS — M6281 Muscle weakness (generalized): Secondary | ICD-10-CM | POA: Diagnosis not present

## 2012-09-12 DIAGNOSIS — M25569 Pain in unspecified knee: Secondary | ICD-10-CM | POA: Diagnosis not present

## 2012-09-14 DIAGNOSIS — M25669 Stiffness of unspecified knee, not elsewhere classified: Secondary | ICD-10-CM | POA: Diagnosis not present

## 2012-09-14 DIAGNOSIS — M6281 Muscle weakness (generalized): Secondary | ICD-10-CM | POA: Diagnosis not present

## 2012-09-14 DIAGNOSIS — M25569 Pain in unspecified knee: Secondary | ICD-10-CM | POA: Diagnosis not present

## 2012-09-14 DIAGNOSIS — Z96659 Presence of unspecified artificial knee joint: Secondary | ICD-10-CM | POA: Diagnosis not present

## 2012-09-16 DIAGNOSIS — M25569 Pain in unspecified knee: Secondary | ICD-10-CM | POA: Diagnosis not present

## 2012-09-16 DIAGNOSIS — Z96659 Presence of unspecified artificial knee joint: Secondary | ICD-10-CM | POA: Diagnosis not present

## 2012-09-16 DIAGNOSIS — M25669 Stiffness of unspecified knee, not elsewhere classified: Secondary | ICD-10-CM | POA: Diagnosis not present

## 2012-09-16 DIAGNOSIS — M6281 Muscle weakness (generalized): Secondary | ICD-10-CM | POA: Diagnosis not present

## 2012-09-19 DIAGNOSIS — M25569 Pain in unspecified knee: Secondary | ICD-10-CM | POA: Diagnosis not present

## 2012-09-19 DIAGNOSIS — M6281 Muscle weakness (generalized): Secondary | ICD-10-CM | POA: Diagnosis not present

## 2012-09-19 DIAGNOSIS — M25669 Stiffness of unspecified knee, not elsewhere classified: Secondary | ICD-10-CM | POA: Diagnosis not present

## 2012-09-19 DIAGNOSIS — Z96659 Presence of unspecified artificial knee joint: Secondary | ICD-10-CM | POA: Diagnosis not present

## 2012-09-21 DIAGNOSIS — M6281 Muscle weakness (generalized): Secondary | ICD-10-CM | POA: Diagnosis not present

## 2012-09-21 DIAGNOSIS — M25569 Pain in unspecified knee: Secondary | ICD-10-CM | POA: Diagnosis not present

## 2012-09-21 DIAGNOSIS — M25669 Stiffness of unspecified knee, not elsewhere classified: Secondary | ICD-10-CM | POA: Diagnosis not present

## 2012-09-21 DIAGNOSIS — Z96659 Presence of unspecified artificial knee joint: Secondary | ICD-10-CM | POA: Diagnosis not present

## 2012-09-23 DIAGNOSIS — M25569 Pain in unspecified knee: Secondary | ICD-10-CM | POA: Diagnosis not present

## 2012-09-23 DIAGNOSIS — Z96659 Presence of unspecified artificial knee joint: Secondary | ICD-10-CM | POA: Diagnosis not present

## 2012-09-23 DIAGNOSIS — M6281 Muscle weakness (generalized): Secondary | ICD-10-CM | POA: Diagnosis not present

## 2012-09-23 DIAGNOSIS — M25669 Stiffness of unspecified knee, not elsewhere classified: Secondary | ICD-10-CM | POA: Diagnosis not present

## 2012-09-26 DIAGNOSIS — Z96659 Presence of unspecified artificial knee joint: Secondary | ICD-10-CM | POA: Diagnosis not present

## 2012-09-26 DIAGNOSIS — M6281 Muscle weakness (generalized): Secondary | ICD-10-CM | POA: Diagnosis not present

## 2012-09-26 DIAGNOSIS — M25669 Stiffness of unspecified knee, not elsewhere classified: Secondary | ICD-10-CM | POA: Diagnosis not present

## 2012-09-26 DIAGNOSIS — M25569 Pain in unspecified knee: Secondary | ICD-10-CM | POA: Diagnosis not present

## 2012-09-28 DIAGNOSIS — M25569 Pain in unspecified knee: Secondary | ICD-10-CM | POA: Diagnosis not present

## 2012-09-28 DIAGNOSIS — M25669 Stiffness of unspecified knee, not elsewhere classified: Secondary | ICD-10-CM | POA: Diagnosis not present

## 2012-09-28 DIAGNOSIS — M6281 Muscle weakness (generalized): Secondary | ICD-10-CM | POA: Diagnosis not present

## 2012-09-28 DIAGNOSIS — Z96659 Presence of unspecified artificial knee joint: Secondary | ICD-10-CM | POA: Diagnosis not present

## 2012-09-30 DIAGNOSIS — M25569 Pain in unspecified knee: Secondary | ICD-10-CM | POA: Diagnosis not present

## 2012-09-30 DIAGNOSIS — M6281 Muscle weakness (generalized): Secondary | ICD-10-CM | POA: Diagnosis not present

## 2012-09-30 DIAGNOSIS — M25669 Stiffness of unspecified knee, not elsewhere classified: Secondary | ICD-10-CM | POA: Diagnosis not present

## 2012-09-30 DIAGNOSIS — Z96659 Presence of unspecified artificial knee joint: Secondary | ICD-10-CM | POA: Diagnosis not present

## 2012-10-03 DIAGNOSIS — M25669 Stiffness of unspecified knee, not elsewhere classified: Secondary | ICD-10-CM | POA: Diagnosis not present

## 2012-10-03 DIAGNOSIS — M25569 Pain in unspecified knee: Secondary | ICD-10-CM | POA: Diagnosis not present

## 2012-10-03 DIAGNOSIS — M6281 Muscle weakness (generalized): Secondary | ICD-10-CM | POA: Diagnosis not present

## 2012-10-03 DIAGNOSIS — Z96659 Presence of unspecified artificial knee joint: Secondary | ICD-10-CM | POA: Diagnosis not present

## 2012-10-05 DIAGNOSIS — IMO0001 Reserved for inherently not codable concepts without codable children: Secondary | ICD-10-CM | POA: Diagnosis not present

## 2012-10-05 DIAGNOSIS — Z96659 Presence of unspecified artificial knee joint: Secondary | ICD-10-CM | POA: Diagnosis not present

## 2012-10-05 DIAGNOSIS — Z471 Aftercare following joint replacement surgery: Secondary | ICD-10-CM | POA: Diagnosis not present

## 2012-10-05 DIAGNOSIS — Z7901 Long term (current) use of anticoagulants: Secondary | ICD-10-CM | POA: Diagnosis not present

## 2012-10-05 DIAGNOSIS — M25669 Stiffness of unspecified knee, not elsewhere classified: Secondary | ICD-10-CM | POA: Diagnosis not present

## 2012-10-05 DIAGNOSIS — M6281 Muscle weakness (generalized): Secondary | ICD-10-CM | POA: Diagnosis not present

## 2012-10-05 DIAGNOSIS — M25569 Pain in unspecified knee: Secondary | ICD-10-CM | POA: Diagnosis not present

## 2012-10-07 DIAGNOSIS — M6281 Muscle weakness (generalized): Secondary | ICD-10-CM | POA: Diagnosis not present

## 2012-10-07 DIAGNOSIS — M25669 Stiffness of unspecified knee, not elsewhere classified: Secondary | ICD-10-CM | POA: Diagnosis not present

## 2012-10-07 DIAGNOSIS — Z96659 Presence of unspecified artificial knee joint: Secondary | ICD-10-CM | POA: Diagnosis not present

## 2012-10-07 DIAGNOSIS — M25569 Pain in unspecified knee: Secondary | ICD-10-CM | POA: Diagnosis not present

## 2012-10-10 DIAGNOSIS — M6281 Muscle weakness (generalized): Secondary | ICD-10-CM | POA: Diagnosis not present

## 2012-10-10 DIAGNOSIS — M25669 Stiffness of unspecified knee, not elsewhere classified: Secondary | ICD-10-CM | POA: Diagnosis not present

## 2012-10-10 DIAGNOSIS — Z96659 Presence of unspecified artificial knee joint: Secondary | ICD-10-CM | POA: Diagnosis not present

## 2012-10-10 DIAGNOSIS — M25569 Pain in unspecified knee: Secondary | ICD-10-CM | POA: Diagnosis not present

## 2012-10-12 DIAGNOSIS — M25669 Stiffness of unspecified knee, not elsewhere classified: Secondary | ICD-10-CM | POA: Diagnosis not present

## 2012-10-12 DIAGNOSIS — M6281 Muscle weakness (generalized): Secondary | ICD-10-CM | POA: Diagnosis not present

## 2012-10-12 DIAGNOSIS — Z96659 Presence of unspecified artificial knee joint: Secondary | ICD-10-CM | POA: Diagnosis not present

## 2012-10-12 DIAGNOSIS — M25569 Pain in unspecified knee: Secondary | ICD-10-CM | POA: Diagnosis not present

## 2012-10-14 DIAGNOSIS — M6281 Muscle weakness (generalized): Secondary | ICD-10-CM | POA: Diagnosis not present

## 2012-10-14 DIAGNOSIS — M25569 Pain in unspecified knee: Secondary | ICD-10-CM | POA: Diagnosis not present

## 2012-10-14 DIAGNOSIS — M25669 Stiffness of unspecified knee, not elsewhere classified: Secondary | ICD-10-CM | POA: Diagnosis not present

## 2012-10-14 DIAGNOSIS — Z96659 Presence of unspecified artificial knee joint: Secondary | ICD-10-CM | POA: Diagnosis not present

## 2012-10-18 ENCOUNTER — Encounter: Payer: Self-pay | Admitting: Internal Medicine

## 2012-10-18 ENCOUNTER — Ambulatory Visit (INDEPENDENT_AMBULATORY_CARE_PROVIDER_SITE_OTHER): Payer: Medicare Other | Admitting: Internal Medicine

## 2012-10-18 VITALS — BP 108/62 | HR 86 | Temp 98.3°F | Resp 10 | Ht 68.0 in | Wt 193.0 lb

## 2012-10-18 DIAGNOSIS — E119 Type 2 diabetes mellitus without complications: Secondary | ICD-10-CM | POA: Diagnosis not present

## 2012-10-18 MED ORDER — GLIPIZIDE ER 5 MG PO TB24: 5.0000 mg | ORAL_TABLET | Freq: Every day | ORAL | Status: DC

## 2012-10-18 MED ORDER — L-METHYLFOLATE-B6-B12 3-35-2 MG PO TABS: 1.0000 | ORAL_TABLET | Freq: Two times a day (BID) | ORAL | Status: DC

## 2012-10-18 MED ORDER — CANAGLIFLOZIN 100 MG PO TABS: 1.0000 | ORAL_TABLET | Freq: Every day | ORAL | Status: DC

## 2012-10-18 NOTE — Progress Notes (Signed)
Patient ID: William Walton, male   DOB: 19-May-1940, 72 y.o.   MRN: [id number]  HPI: William Walton is a 72 y.o.-year-old male, referred by his PCP, William Walton, for management of DM2, non-insulin-dependent, uncontrolled, with complications (CAD - s/p CABG, peripheral neuropathy, ED), likely 2/2 Agent Orange. Also sees William Walton at the [facility] - [place]). Previously followed by William Walton.  Patient has been diagnosed with diabetes in 2010 (Hb 11.6%); he had an AMI in 2012 >> CABG at [facility] >> only then he started to be treated for DM (had PTSD and depression before and did not see a William). He was then started on insulin and diet. [facility] William. William Walton.   I reviewed her notes:  02/20/2011: A1c 7.1% 05/22/2011: A1c 6.8%, after which he was taken off the insulin >> A1C started to increase.  11/19/2011: A1c 8.5%, TSH 2.18, BUN/creatinine 16/1.0, ACR 3.7 02/24/2012: A1c 8.4%, vitamin B12 321 Last hemoglobin A1c was: 06/01/2012: 7.6%   Pt is on a regimen of: - Janumet XR 1000-50 mg po bid - Januvia part started 02/2012 - Glipizide ER 5 mg  Pt checks his sugars 2x a day and they are: - am: 140-180 - usually 1h after dinner 120-220 No lows. Lowest sugar was 70s seldom - 2x a month; he has hypoglycemia awareness at 70. Highest sugar was 300 - after b'day cake.  Pt's meals are: - Breakfast: fruit - Lunch: grilled chicken + salad/vegetables - Dinner: grilled/baked meat + vegetables - Snacks: PB + crackers He drinks diet sodas.  He exercises by going to the gym 3x a week.  - no CKD, last BUN/creatinine: 16/1.0 in 05/2012 per William Walton notes. He is on Lisinopril 2.5.  - last set of lipids: 200/214/26.8/130.4. He is on Crestor 20 mg daily, started after the previously mentioned results. He was previously on Lipitor 40.  He is on ASA 81.  - last eye exam was 03/2012, has them yearly - William Walton. No William.  - + numbness and tingling in his feet. He is on  Neurontin 300 mg daily, w/o resolution of sxs.  I reviewed his chart and he also has a history of PTSD ([employer] veteran), HTN, HL, h/o L TKR.  No FH of DM.  ROS: Constitutional: no weight gain/loss, + fatigue, no subjective hyperthermia/hypothermia; + poor sleep Eyes: no blurry vision, no xerophthalmia ENT: no sore throat, no nodules palpated in throat, no dysphagia/odynophagia, no hoarseness; + decreased hearing, + tinnitus Cardiovascular: no CP/+SOB/no palpitations/no leg swelling Respiratory: + cough/+ SOB Gastrointestinal: no N/V/D/C, + Heartburn Musculoskeletal: + muscle/+ joint aches Skin: no rashes; + hair loss Neurological: no tremors/numbness/tingling/dizziness Psychiatric: +depression/no anxiety Difficulty with erections  Past Medical History  Diagnosis Date  . CAD (coronary artery disease) 08/2010    STEMI 08/2010 s/p 5v CABG at [facility], cardiac rehab - only did 7/26 sessions  . Diabetes mellitus type II 08/2010    followed by William. William Walton [facility]), no retinopathy William Walton)  . HTN (hypertension)   . HLD (hyperlipidemia)   . ED (erectile dysfunction)     likely medication induced   Past Surgical History  Procedure Laterality Date  . Coronary artery bypass graft  08/2010    5v, [facility]. William Walton  . US echocardiography  08/2010    mild LV dysfxn (EF 45%) with severe LVH, nl R vent sys fxn  . Esophagogastroduodenoscopy  08/2010    normal esophagus, localized gastric erythema  .  Cataract extraction  11/2010  . Total knee arthroplasty      Left knee   History   Social History  . Marital Status: Married    Spouse Name: N/A    Number of Children: 2  . Years of Education: 4 yr colle   Occupational History  . retired Hotel manager    Social History Main Topics  . Smoking status: Former Smoker    Quit date: 02/16/1966  . Smokeless tobacco: Never Used  . Alcohol Use: No  . Drug Use: No  . Sexual Activity: Not on file   Other Topics Concern  . 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 aspirin 81 MG tablet Take 81 mg by mouth daily.        Marland Kitchen lisinopril (PRINIVIL,ZESTRIL) 2.5 MG tablet Take 5 mg by mouth daily.       . metoprolol tartrate (LOPRESSOR) 25 MG tablet Take 0.5 tablets (12.5 mg total) by mouth 2 (two) times daily.      Marland Kitchen acetaminophen (TYLENOL) 325 MG tablet Take 650 mg by mouth every 6 (six) hours as needed.        Marland Kitchen guaiFENesin-codeine (ROBITUSSIN AC) 100-10 MG/5ML syrup Take 5 mLs by mouth 2 (two) times daily as needed for cough (sedation precautions).  180 mL  0  . metformin (FORTAMET) 500 MG (OSM) 24 hr tablet Take 1,000 mg by mouth daily with supper.         No current facility-administered medications on file prior to visit.   No Known Allergies  Family History  Problem Relation Age of Onset  . Cancer Father 75    died of prostate or bladder CA, pt unsure  . Coronary artery disease Brother 59    bypass  . Hyperlipidemia Brother   . Hypertension Brother   . Coronary artery disease Maternal Uncle 59    MI  . Diabetes Neg Hx   . Stroke Neg Hx    PE: BP 108/62  Pulse 86  Temp(Src) 98.3 F (36.8 C) (Oral)  Resp 10  Ht 5\' 8"  (1.727 m)  Wt 193 lb (87.544 kg)  BMI 29.35 kg/m2  SpO2 96% Wt Readings from Last 3 Encounters:  10/18/12 193 lb (87.544 kg)  04/25/12 195 lb 8 oz (88.678 kg)  01/01/11 184 lb 4 oz (83.575 kg)   Constitutional: overweight, in NAD Eyes: PERRLA, EOMI, no exophthalmos ENT: moist mucous membranes, no thyromegaly, no cervical lymphadenopathy Cardiovascular: RRR, No MRG Respiratory: CTA B Gastrointestinal: abdomen soft,  NT, ND, BS+ Musculoskeletal: no deformities, strength intact in all 4 Skin: moist, warm, no rashes Neurological: no tremor with outstretched hands, DTR normal in all 4 Foot exam performed today.  ASSESSMENT: 1. DM2, non-insulin-dependent, uncontrolled, with complications - CAD, s/p AMI and 5v CABG at [facility] 08/29/2010 - Peripheral neuropathy - ED  PLAN:  1. Patient with long-standing, recently more uncontrolled diabetes, on oral antidiabetic regimen, which became insufficient - We discussed about options for treatment, and I suggested to:   Continue JanuMet 1000-50 mg bid  Continue Glipizide XL 5 mg  daily  Start Invokana 100 mg daily in am - we discussed about SEs of Invokana, which are: dizziness (advised to be careful when stands from sitting position), decreased BP - usually not < normal (BP today is normal), and fungal UTIs (advised to let me know if develops one). Advised that this might cause him to lose some weight, which he would very much like, he mentions - Strongly advised him to check sugars at different times of the day - check 2 times a day, rotating checks - we also discussed about diet, advised to avoid high glycemic index foods (e.g. Concentrated sweets) and increase use of fruit and vegetables. We will spend more time on dietary advice at next visits - given sugar log and advised how to fill it and to bring it at next appt  - given foot care handout and explained the principles  - given instructions for hypoglycemia management "15-15 rule"  - advised for yearly eye exams - he is up to date - will check a Hba1C today - Return to clinic in one month with sugar log   2. Peripheral neuropathy - foot exam performed today: decreased sensation bilaterally Can try the following combination for neuropathy: - alpha-lipoic acid 600 mg twice a day - Metanx (can get it cheaper from [employer] online) 1 tablet twice a day If Metanx is too expensive, can get B complex 1 tablet  once a day - for now, can increase Neurontin to 900 mg at night or even further if needed  He wouls want me to send my note to William. Renard Walton (he will let me know the fax nr).   Office Visit on 10/18/2012  Component Date Value Range Status  . Hemoglobin A1C 10/18/2012 7.7* 4.6 - 6.5 % Final   Glycemic Control Guidelines for People with Diabetes:Non Diabetic:  <6%Goal of Therapy: <7%Additional Action Suggested:  >8%    CC: William. Dellia Nims, FAX number [phone]

## 2012-10-18 NOTE — Patient Instructions (Addendum)
Please return in 1 month with your sugar log.  Continue Janumet and Glipizide and start Invokana 100 mg daily in am. Can try the following combination for neuropathy: - alpha-lipoic acid 600 mg twice a day (over the counter) - Metanx (can get it cheaper from [employer] online) 1 tablet twice a day If Metanx is too expensive, you can get B complex 1 tablet once a day   PATIENT INSTRUCTIONS FOR TYPE 2 DIABETES:  **Please join MyChart!** - see attached instructions about how to join   DIET AND EXERCISE Diet and exercise is an important part of diabetic treatment.  We recommended aerobic exercise in the form of brisk walking (working between 40-60% of maximal aerobic capacity, similar to brisk walking) for 150 minutes per week (such as 30 minutes five days per week) along with 3 times per week performing 'resistance' training (using various gauge rubber tubes with handles) 5-10 exercises involving the major muscle groups (upper body, lower body and core) performing 10-15 repetitions (or near fatigue) each exercise. Start at half the above goal but build slowly to reach the above goals. If limited by weight, joint pain, or disability, we recommend daily walking in a swimming pool with water up to waist to reduce pressure from joints while allow for adequate exercise.    BLOOD GLUCOSES Monitoring your blood glucoses is important for continued management of your diabetes. Please check your blood glucoses 2-4 times a day: fasting, before meals and at bedtime (you can rotate these measurements - e.g. one day check before the 3 meals, the next day check before 2 of the meals and before bedtime, etc.   HYPOGLYCEMIA (low blood sugar) Hypoglycemia is usually a reaction to not eating, exercising, or taking too much insulin/ other diabetes drugs.  Symptoms include tremors, sweating, hunger, confusion, headache, etc. Treat IMMEDIATELY with 15 grams of Carbs:   4 glucose tablets    cup regular juice/soda   2 tablespoons raisins   4 teaspoons sugar   1 tablespoon honey Recheck blood glucose in 15 mins and repeat above if still symptomatic/blood glucose <100. Please contact our office at [phone] if you have questions about how to next handle your insulin.  RECOMMENDATIONS TO REDUCE YOUR RISK OF DIABETIC COMPLICATIONS: * Take your prescribed MEDICATION(S). * Follow a DIABETIC diet: Complex carbs, fiber rich foods, heart healthy fish twice weekly, (monounsaturated and polyunsaturated) fats * AVOID saturated/trans fats, high fat foods, >2,300 mg salt per day. * EXERCISE at least 5 times a week for 30 minutes or preferably daily.  * DO NOT SMOKE OR DRINK more than 1 drink a day. * Check your FEET every day. Do not wear tightfitting shoes. Contact us if you develop an ulcer * See your EYE doctor once a year or more if needed * Get a FLU shot once a year * Get a PNEUMONIA vaccine once before and once after age 68 years  GOALS:  * Your Hemoglobin A1c of <7%  * fasting sugars need to be <130 * after meals sugars need to be <180 (2h after you start eating) * Your Systolic BP should be 140 or lower  * Your Diastolic BP should be 80 or lower  * Your HDL (Good Cholesterol) should be 40 or higher  * Your LDL (Bad Cholesterol) should be 100 or lower  * Your Triglycerides should be 150 or lower  * Your Urine microalbumin (kidney function) should be <30 * Your Body Mass Index should be 25 or lower  We will be glad to help you achieve these goals. Our telephone number is: [phone].

## 2012-10-19 ENCOUNTER — Encounter: Payer: Self-pay | Admitting: Family Medicine

## 2012-10-19 DIAGNOSIS — M25569 Pain in unspecified knee: Secondary | ICD-10-CM | POA: Diagnosis not present

## 2012-10-19 DIAGNOSIS — M6281 Muscle weakness (generalized): Secondary | ICD-10-CM | POA: Diagnosis not present

## 2012-10-19 DIAGNOSIS — Z96659 Presence of unspecified artificial knee joint: Secondary | ICD-10-CM | POA: Diagnosis not present

## 2012-10-19 DIAGNOSIS — M25669 Stiffness of unspecified knee, not elsewhere classified: Secondary | ICD-10-CM | POA: Diagnosis not present

## 2012-10-19 LAB — HEMOGLOBIN A1C: Hgb A1c MFr Bld: 7.7 % — ABNORMAL HIGH (ref 4.6–6.5)

## 2012-10-19 NOTE — Telephone Encounter (Signed)
See Mychart note 

## 2012-10-24 DIAGNOSIS — M25569 Pain in unspecified knee: Secondary | ICD-10-CM | POA: Diagnosis not present

## 2012-10-24 DIAGNOSIS — Z96659 Presence of unspecified artificial knee joint: Secondary | ICD-10-CM | POA: Diagnosis not present

## 2012-10-24 DIAGNOSIS — M25669 Stiffness of unspecified knee, not elsewhere classified: Secondary | ICD-10-CM | POA: Diagnosis not present

## 2012-10-24 DIAGNOSIS — M6281 Muscle weakness (generalized): Secondary | ICD-10-CM | POA: Diagnosis not present

## 2012-10-26 DIAGNOSIS — M6281 Muscle weakness (generalized): Secondary | ICD-10-CM | POA: Diagnosis not present

## 2012-10-26 DIAGNOSIS — M25569 Pain in unspecified knee: Secondary | ICD-10-CM | POA: Diagnosis not present

## 2012-10-26 DIAGNOSIS — Z96659 Presence of unspecified artificial knee joint: Secondary | ICD-10-CM | POA: Diagnosis not present

## 2012-10-26 DIAGNOSIS — M25669 Stiffness of unspecified knee, not elsewhere classified: Secondary | ICD-10-CM | POA: Diagnosis not present

## 2012-10-28 DIAGNOSIS — M25569 Pain in unspecified knee: Secondary | ICD-10-CM | POA: Diagnosis not present

## 2012-10-28 DIAGNOSIS — Z96659 Presence of unspecified artificial knee joint: Secondary | ICD-10-CM | POA: Diagnosis not present

## 2012-10-28 DIAGNOSIS — M6281 Muscle weakness (generalized): Secondary | ICD-10-CM | POA: Diagnosis not present

## 2012-10-28 DIAGNOSIS — M25669 Stiffness of unspecified knee, not elsewhere classified: Secondary | ICD-10-CM | POA: Diagnosis not present

## 2012-10-31 DIAGNOSIS — M25569 Pain in unspecified knee: Secondary | ICD-10-CM | POA: Diagnosis not present

## 2012-10-31 DIAGNOSIS — Z96659 Presence of unspecified artificial knee joint: Secondary | ICD-10-CM | POA: Diagnosis not present

## 2012-10-31 DIAGNOSIS — M25669 Stiffness of unspecified knee, not elsewhere classified: Secondary | ICD-10-CM | POA: Diagnosis not present

## 2012-10-31 DIAGNOSIS — M6281 Muscle weakness (generalized): Secondary | ICD-10-CM | POA: Diagnosis not present

## 2012-11-02 DIAGNOSIS — M6281 Muscle weakness (generalized): Secondary | ICD-10-CM | POA: Diagnosis not present

## 2012-11-02 DIAGNOSIS — M25569 Pain in unspecified knee: Secondary | ICD-10-CM | POA: Diagnosis not present

## 2012-11-02 DIAGNOSIS — Z96659 Presence of unspecified artificial knee joint: Secondary | ICD-10-CM | POA: Diagnosis not present

## 2012-11-02 DIAGNOSIS — M25669 Stiffness of unspecified knee, not elsewhere classified: Secondary | ICD-10-CM | POA: Diagnosis not present

## 2012-11-07 DIAGNOSIS — M25569 Pain in unspecified knee: Secondary | ICD-10-CM | POA: Diagnosis not present

## 2012-11-07 DIAGNOSIS — Z96659 Presence of unspecified artificial knee joint: Secondary | ICD-10-CM | POA: Diagnosis not present

## 2012-11-07 DIAGNOSIS — M6281 Muscle weakness (generalized): Secondary | ICD-10-CM | POA: Diagnosis not present

## 2012-11-07 DIAGNOSIS — M25669 Stiffness of unspecified knee, not elsewhere classified: Secondary | ICD-10-CM | POA: Diagnosis not present

## 2012-11-14 DIAGNOSIS — Z96659 Presence of unspecified artificial knee joint: Secondary | ICD-10-CM | POA: Diagnosis not present

## 2012-11-17 ENCOUNTER — Ambulatory Visit (INDEPENDENT_AMBULATORY_CARE_PROVIDER_SITE_OTHER): Payer: Medicare Other | Admitting: Internal Medicine

## 2012-11-17 ENCOUNTER — Encounter: Payer: Self-pay | Admitting: Internal Medicine

## 2012-11-17 VITALS — BP 118/68 | HR 85 | Temp 98.3°F | Resp 12 | Wt 190.5 lb

## 2012-11-17 DIAGNOSIS — E119 Type 2 diabetes mellitus without complications: Secondary | ICD-10-CM

## 2012-11-17 DIAGNOSIS — Z23 Encounter for immunization: Secondary | ICD-10-CM | POA: Diagnosis not present

## 2012-11-17 MED ORDER — GABAPENTIN 300 MG PO CAPS: 300.0000 mg | ORAL_CAPSULE | Freq: Two times a day (BID) | ORAL | Status: DC

## 2012-11-17 MED ORDER — CANAGLIFLOZIN 300 MG PO TABS: 1.0000 | ORAL_TABLET | Freq: Every day | ORAL | Status: DC

## 2012-11-17 NOTE — Progress Notes (Signed)
Patient ID: William Walton, male   DOB: 29-Apr-1940, 72 y.o.   MRN: [id number]  HPI: William Walton is a 72 y.o.-year-old male, referred by his PCP, Dr.Gutierrez, for management of DM2, dx 2010, non-insulin-dependent, uncontrolled, with complications (CAD - s/p CABG, peripheral neuropathy, ED), likely 2/2 Agent Orange. Also sees Dr Hardie Lora at the [facility] clinic - William Walton). Previously followed by Dr Tedd Sias. Last visit 1 mo ago.  HbA1C levels: Lab Results  Component Value Date   HGBA1C 7.7* 10/18/2012  02/20/2011: A1c 7.1% 05/22/2011: A1c 6.8%, after which he was taken off the insulin >> A1C started to increase.  11/19/2011: A1c 8.5%, TSH 2.18, BUN/creatinine 16/1.0, ACR 3.7 02/24/2012: A1c 8.4%, vitamin B12 321 06/01/2012: 7.6%   Pt is on a regimen of: - Janumet XR 1000-50 mg po bid - Januvia part started 02/2012 - Glipizide ER 5 mg - Invokana 100 mg  Pt checks his sugars 2x a day and they are: - am: 140-180 >> 98-153 -before dinner: 70, 110, 160 - usually 1h after dinner 120-220 >> not checking - 2h after dinner: 106-189 (one at 276 - cake, one at 226 - pizza) - bedtime: 125-192 - at 11 pm: 90 No lows. Lowest sugar was 70; he has hypoglycemia awareness at 70. Highest sugar was 276 - after cake.  He exercises by going to the gym 3x a week.  - no CKD, last BUN/creatinine: 16/1.0 in 05/2012 per Dr Pricilla Handler notes. He is on Lisinopril 2.5.  - last set of lipids: 200/214/26.8/130.4. He is on Crestor 20 mg daily, started after the previously mentioned results. He was previously on Lipitor 40.  He is on ASA 81.  - last eye exam was 03/2012, has them yearly - Dr Clydene Pugh. No DR.  - + numbness and tingling in his feet. He is on Neurontin 300 mg increased to 2x a day.  I reviewed his chart and he also has a history of PTSD ([employer] veteran), HTN, HL, h/o L TKR.  I reviewed pt's medications, allergies, PMH, social hx, family hx and no changes required, except as mentioned above:  Neurontin 300 mg increased to 2x a day.  ROS: Constitutional: no weight gain/loss, no fatigue, no subjective hyperthermia/hypothermia Eyes: no blurry vision, no xerophthalmia ENT: no sore throat, no nodules palpated in throat, no dysphagia/odynophagia, no hoarseness Cardiovascular: no CP/SOB/palpitations/leg swelling Respiratory: no cough/SOB Gastrointestinal: no N/V/D/C Musculoskeletal: no muscle/joint aches Skin: no rashes Neurological: no tremors/numbness/tingling/dizziness Psychiatric: no depression/anxiety  PE: BP 118/68  Pulse 85  Temp(Src) 98.3 F (36.8 C) (Oral)  Resp 12  Wt 190 lb 8 oz (86.41 kg)  BMI 28.97 kg/m2  SpO2 95% Wt Readings from Last 3 Encounters:  11/17/12 190 lb 8 oz (86.41 kg)  10/18/12 193 lb (87.544 kg)  04/25/12 195 lb 8 oz (88.678 kg)   Constitutional: overweight, in NAD Eyes: PERRLA, EOMI, no exophthalmos ENT: moist mucous membranes, no thyromegaly, no cervical lymphadenopathy Cardiovascular: RRR, No MRG Respiratory: CTA B Gastrointestinal: abdomen soft, NT, ND, BS+ Musculoskeletal: no deformities, strength intact in all 4 Skin: moist, warm, no rashes Neurological: no tremor with outstretched hands, DTR normal in all 4  ASSESSMENT: 1. DM2, non-insulin-dependent, uncontrolled, with complications - CAD, s/p AMI and 5v CABG at [facility] 08/29/2010 - Peripheral neuropathy - ED  PLAN:  1. Patient with long-standing, recently more uncontrolled diabetes, but improved in last month, on oral antidiabetic regimen - We discussed about options for treatment, and I suggested to:   Continue JanuMet 1000-50  mg bid  Continue Glipizide XL 5 mg daily  Increase Invokana to 300 mg daily in am  - advised him to check sugars at lunchtime, too - continue check 2 times a day, rotating checks - will give flu vaccine - Return to clinic in one month with sugar log   2. Peripheral neuropathy - he increased the Neurontin to 300 mg bid - sent a new Rx to his  pharmacy to reflect this dose He would want me to send my note to [place].   CC: Dr. Dellia Nims, FAX number [phone]

## 2012-12-08 ENCOUNTER — Other Ambulatory Visit: Payer: Self-pay | Admitting: *Deleted

## 2012-12-08 MED ORDER — CANAGLIFLOZIN 300 MG PO TABS: 1.0000 | ORAL_TABLET | Freq: Every morning | ORAL | Status: DC

## 2012-12-12 DIAGNOSIS — Z96659 Presence of unspecified artificial knee joint: Secondary | ICD-10-CM | POA: Diagnosis not present

## 2012-12-12 DIAGNOSIS — M25569 Pain in unspecified knee: Secondary | ICD-10-CM | POA: Diagnosis not present

## 2012-12-12 DIAGNOSIS — T8489XA Other specified complication of internal orthopedic prosthetic devices, implants and grafts, initial encounter: Secondary | ICD-10-CM | POA: Diagnosis not present

## 2012-12-26 DIAGNOSIS — Z96659 Presence of unspecified artificial knee joint: Secondary | ICD-10-CM | POA: Diagnosis not present

## 2013-01-16 DIAGNOSIS — I2510 Atherosclerotic heart disease of native coronary artery without angina pectoris: Secondary | ICD-10-CM | POA: Diagnosis not present

## 2013-01-16 DIAGNOSIS — R0602 Shortness of breath: Secondary | ICD-10-CM | POA: Diagnosis not present

## 2013-01-16 DIAGNOSIS — I10 Essential (primary) hypertension: Secondary | ICD-10-CM | POA: Diagnosis not present

## 2013-01-16 DIAGNOSIS — I209 Angina pectoris, unspecified: Secondary | ICD-10-CM | POA: Diagnosis not present

## 2013-01-30 ENCOUNTER — Other Ambulatory Visit: Payer: Self-pay | Admitting: *Deleted

## 2013-01-30 MED ORDER — GLIPIZIDE ER 5 MG PO TB24: 5.0000 mg | ORAL_TABLET | Freq: Every day | ORAL | Status: DC

## 2013-02-03 ENCOUNTER — Other Ambulatory Visit: Payer: Self-pay | Admitting: *Deleted

## 2013-02-03 MED ORDER — GLIPIZIDE ER 5 MG PO TB24: 5.0000 mg | ORAL_TABLET | Freq: Every day | ORAL | Status: DC

## 2013-02-03 NOTE — Telephone Encounter (Signed)
Escript was marked no print. Rx refill did not go through. Sent new refill to pharmacy.

## 2013-02-07 ENCOUNTER — Other Ambulatory Visit: Payer: Self-pay | Admitting: *Deleted

## 2013-02-07 MED ORDER — GLIPIZIDE ER 5 MG PO TB24: ORAL_TABLET | ORAL | Status: DC

## 2013-02-07 NOTE — Telephone Encounter (Signed)
New rx to replace the last one, dosage was wrong.

## 2013-02-10 ENCOUNTER — Other Ambulatory Visit: Payer: Self-pay | Admitting: *Deleted

## 2013-02-17 ENCOUNTER — Ambulatory Visit: Payer: Medicare Other | Admitting: Internal Medicine

## 2013-02-20 ENCOUNTER — Ambulatory Visit: Payer: Medicare Other | Admitting: Internal Medicine

## 2013-02-27 ENCOUNTER — Encounter: Payer: Self-pay | Admitting: Internal Medicine

## 2013-02-27 ENCOUNTER — Ambulatory Visit (INDEPENDENT_AMBULATORY_CARE_PROVIDER_SITE_OTHER): Payer: Medicare Other | Admitting: Internal Medicine

## 2013-02-27 VITALS — BP 116/66 | HR 85 | Temp 97.7°F | Resp 12 | Wt 193.9 lb

## 2013-02-27 DIAGNOSIS — E119 Type 2 diabetes mellitus without complications: Secondary | ICD-10-CM | POA: Diagnosis not present

## 2013-02-27 MED ORDER — GABAPENTIN 300 MG PO CAPS: 900.0000 mg | ORAL_CAPSULE | Freq: Two times a day (BID) | ORAL | Status: DC

## 2013-02-27 MED ORDER — SITAGLIP PHOS-METFORMIN HCL ER 50-1000 MG PO TB24: 1.0000 | ORAL_TABLET | Freq: Two times a day (BID) | ORAL | Status: DC

## 2013-02-27 NOTE — Progress Notes (Addendum)
Patient ID: William Walton, male   DOB: Dec 20, 1940, 73 y.o.   MRN: [id number]  HPI: KAIN MILOSEVIC is a 73 y.o.-year-old male, returning for f/u for DM2, dx 2010, non-insulin-dependent, uncontrolled, with complications (CAD - s/p CABG, peripheral neuropathy, ED), likely 2/2 Agent Orange. Also sees [name] at the [facility] ([place]). Last visit 3 mo ago.  HbA1C levels: Lab Results  Component Value Date   HGBA1C 7.7* 10/18/2012  02/20/2011: A1c 7.1% 05/22/2011: A1c 6.8%, after which he was taken off the insulin >> A1C started to increase.  11/19/2011: A1c 8.5%, TSH 2.18, BUN/creatinine 16/1.0, ACR 3.7 02/24/2012: A1c 8.4%, vitamin B12 321 06/01/2012: 7.6% 01/26/2013: 7.3% - received records from [place]   Pt is on a regimen of: - Janumet XR 1000-50 mg po bid - Januvia part started 02/2012 - Glipizide ER 5 mg - Invokana 300 mg  Pt checks his sugars 2x a day and they are: - am: 140-180 >> 98-153 >> 93-134 - 2h after b'fast: 113 - before lunch: 95-134 - 2h after lunch: 80 - before dinner: 70, 110, 160 >> 86-147 - usually 1h after dinner 120-220 >> not checking  - 2h after dinner: 106-189 (one at 276 - cake, one at 226 - pizza) >> 111-163 - bedtime: 125-192 >> 126-165 - at 11 pm: 90 No lows. Lowest sugar was 86; he has hypoglycemia awareness at 70. Highest sugar was 170s. He exercises by going to the gym 3x a week.  - has mild CKD. Last BUN/creatinine: 19/1.2 (GFR 63.4) in 01/26/2013 << 16/1.0 in 05/2012. Last ACR 12 in 01/26/2013. He is on Lisinopril 2.5.   - has HL. Last set of lipids: 212/249/29/106 (01/26/2013) << prev. 200/214/26.8/130.4. He switched from Lipitor 40 to Crestor 20 mg daily after the previous results. AST and ALT were 11 and 21, resp. at last check in 01/2013.  He is on ASA 81.  - last eye exam was 03/2012, has them yearly - [name]. No DR.  - + numbness and tingling in his feet. He is on Neurontin 300 mg 2x a day.  He also has a history of PTSD  ([employer] veteran), HTN, HL, h/o L TKR.  I reviewed pt's medications, allergies, PMH, social hx, family hx and no changes required, except as mentioned above.  ROS: Constitutional: no weight gain/loss, no fatigue, no subjective hyperthermia/hypothermia, + nocturia Eyes: no blurry vision, no xerophthalmia ENT: no sore throat, no nodules palpated in throat, no dysphagia/odynophagia, no hoarseness Cardiovascular: no CP/SOB/palpitations/leg swelling Respiratory: + cough (has a cold)/SOB Gastrointestinal: no N/V/D/C Musculoskeletal: no muscle/+ joint aches Skin: + rash - left knee Neurological: no tremors/numbness/tingling/dizziness  PE: BP 116/66  Pulse 85  Temp(Src) 97.7 F (36.5 C) (Oral)  Resp 12  Wt 193 lb 14.4 oz (87.952 kg)  SpO2 95% Body mass index is 29.49 kg/(m^2).  Wt Readings from Last 3 Encounters:  02/27/13 193 lb 14.4 oz (87.952 kg)  11/17/12 190 lb 8 oz (86.41 kg)  10/18/12 193 lb (87.544 kg)   Constitutional: overweight, in NAD Eyes: PERRLA, EOMI, no exophthalmos ENT: moist mucous membranes, no thyromegaly, no cervical lymphadenopathy Cardiovascular: RRR, No MRG Respiratory: CTA B Gastrointestinal: abdomen soft, NT, ND, BS+ Musculoskeletal: no deformities, strength intact in all 4 Skin: moist, warm, + rash - left knee, looks like a psoriatic rash; also scalp itching   ASSESSMENT: 1. DM2, non-insulin-dependent, uncontrolled, with complications - CAD, s/p AMI and 5v CABG at [facility] 08/29/2010 - Peripheral neuropathy - ED  PLAN:  1. Patient with long-standing, recently more controlled diabetes. - we reviewed his sugar log together (he did a gereat job with this) and I suggested to:   Continue JanuMet 1000-50 mg but take it all at once in am  Continue Glipizide XL 5 mg daily  Continue Invokana to 300 mg daily in am  - continue check 2 times a day, rotating checks - had flu vaccine this season - refilled his meds  - Return to clinic in 3 months with sugar  log   2. Peripheral neuropathy - he increased the Neurontin to 300 mg bid - sent a new Rx to [facility]  CC: Dr. Ronnie Doss, Kevin number [phone]

## 2013-02-27 NOTE — Patient Instructions (Signed)
Please continue current regimen. Please return in 3 months with your sugar log.

## 2013-02-28 ENCOUNTER — Telehealth: Payer: Self-pay | Admitting: Family Medicine

## 2013-02-28 NOTE — Telephone Encounter (Signed)
If in [place] I think all the docs at [facility] skin [facility] are good. If in [place] suggest Dr. Juel Burrow office or Dr. Allyson Sabal.

## 2013-02-28 NOTE — Telephone Encounter (Signed)
Pt is wanting a recommendation to a dermatologist.  His wife says he doesn't need a referral, just wants to know who you would recommend. Thank you.

## 2013-02-28 NOTE — Telephone Encounter (Signed)
Patient notified

## 2013-03-30 DIAGNOSIS — L570 Actinic keratosis: Secondary | ICD-10-CM | POA: Diagnosis not present

## 2013-03-30 DIAGNOSIS — L578 Other skin changes due to chronic exposure to nonionizing radiation: Secondary | ICD-10-CM | POA: Diagnosis not present

## 2013-03-30 DIAGNOSIS — L408 Other psoriasis: Secondary | ICD-10-CM | POA: Diagnosis not present

## 2013-03-30 DIAGNOSIS — D239 Other benign neoplasm of skin, unspecified: Secondary | ICD-10-CM | POA: Diagnosis not present

## 2013-03-30 DIAGNOSIS — L820 Inflamed seborrheic keratosis: Secondary | ICD-10-CM | POA: Diagnosis not present

## 2013-03-30 DIAGNOSIS — L821 Other seborrheic keratosis: Secondary | ICD-10-CM | POA: Diagnosis not present

## 2013-04-10 ENCOUNTER — Other Ambulatory Visit: Payer: Self-pay | Admitting: *Deleted

## 2013-04-10 MED ORDER — CANAGLIFLOZIN 300 MG PO TABS: 1.0000 | ORAL_TABLET | Freq: Every morning | ORAL | Status: DC

## 2013-04-10 NOTE — Telephone Encounter (Signed)
Pt's wife called and requested a refill of Invokana. Done.

## 2013-05-05 ENCOUNTER — Encounter: Payer: Self-pay | Admitting: Internal Medicine

## 2013-05-05 ENCOUNTER — Other Ambulatory Visit: Payer: Self-pay | Admitting: *Deleted

## 2013-05-05 MED ORDER — L-METHYLFOLATE-B6-B12 3-35-2 MG PO TABS
1.0000 | ORAL_TABLET | Freq: Two times a day (BID) | ORAL | Status: DC
Start: 2013-05-05 — End: 2014-03-27

## 2013-05-05 MED ORDER — SITAGLIP PHOS-METFORMIN HCL ER 50-1000 MG PO TB24: 1.0000 | ORAL_TABLET | Freq: Two times a day (BID) | ORAL | Status: DC

## 2013-05-05 MED ORDER — CANAGLIFLOZIN 300 MG PO TABS: 1.0000 | ORAL_TABLET | Freq: Every morning | ORAL | Status: DC

## 2013-05-05 MED ORDER — GLIPIZIDE ER 5 MG PO TB24: ORAL_TABLET | ORAL | Status: DC

## 2013-05-05 NOTE — Telephone Encounter (Signed)
Pt sent a MyChart message requesting a refill and to have medications sent to [facility]. Called pt to confirm.

## 2013-05-18 ENCOUNTER — Telehealth: Payer: Self-pay | Admitting: Internal Medicine

## 2013-05-18 NOTE — Telephone Encounter (Signed)
Called pt and advised him per Dr Cruzita Lederer that this is the B12-folic acid combination for neuropathy. Pt asked if it was the methylfolate. I advised him yes. Pt understood.

## 2013-05-18 NOTE — Telephone Encounter (Signed)
William Walton, please see below.

## 2013-05-18 NOTE — Telephone Encounter (Signed)
Pt has question about medication  The Metanx is not familiar with him   Call back:[phone]  Thank You :)

## 2013-05-18 NOTE — Telephone Encounter (Signed)
William Walton, can you please call pt: Metanx is a Y24 -folic acid combination for neuropathy, that we received a pharmacy request for.

## 2013-05-25 ENCOUNTER — Other Ambulatory Visit: Payer: Self-pay

## 2013-05-30 ENCOUNTER — Encounter: Payer: Self-pay | Admitting: Internal Medicine

## 2013-05-30 ENCOUNTER — Ambulatory Visit (INDEPENDENT_AMBULATORY_CARE_PROVIDER_SITE_OTHER): Payer: Medicare Other | Admitting: Internal Medicine

## 2013-05-30 VITALS — BP 112/62 | HR 85 | Temp 98.1°F | Resp 12 | Wt 194.0 lb

## 2013-05-30 DIAGNOSIS — E119 Type 2 diabetes mellitus without complications: Secondary | ICD-10-CM

## 2013-05-30 LAB — BASIC METABOLIC PANEL
BUN: 19 mg/dL (ref 6–23)
CALCIUM: 9.2 mg/dL (ref 8.4–10.5)
CO2: 23 mEq/L (ref 19–32)
CREATININE: 1.1 mg/dL (ref 0.4–1.5)
Chloride: 104 mEq/L (ref 96–112)
GFR: 73.06 mL/min (ref >60.00)
GLUCOSE: 118 mg/dL — AB (ref 70–99)
Potassium: 4.2 mEq/L (ref 3.5–5.1)
Sodium: 137 mEq/L (ref 135–145)

## 2013-05-30 LAB — HEMOGLOBIN A1C: Hgb A1c MFr Bld: 7.3 % — ABNORMAL HIGH (ref 4.6–6.5)

## 2013-05-30 NOTE — Patient Instructions (Signed)
   Continue JanuMet 1000-50 mg - all at once in am  Continue Glipizide XL 5 mg daily  Continue Invokana to 300 mg daily in am   Please come back for a follow-up appointment in 3 months with your sugar log.  Please stop at the lab.

## 2013-05-30 NOTE — Progress Notes (Addendum)
Patient ID: William Walton, male   DOB: 06-Aug-1940, 73 y.o.   MRN: [id number]  HPI: JOSHUAH Walton is a 73 y.o.-year-old male, returning for f/u for DM2, dx 2010, non-insulin-dependent, uncontrolled, with complications (CAD - s/p CABG, peripheral neuropathy, ED), likely 2/2 Agent Orange. Also sees [name] at the [facility] ([place]). Last visit 3 mo ago.  HbA1C levels: Lab Results  Component Value Date   HGBA1C 7.7* 10/18/2012  02/20/2011: A1c 7.1% 05/22/2011: A1c 6.8%, after which he was taken off the insulin >> A1C started to increase.  11/19/2011: A1c 8.5%, TSH 2.18, BUN/creatinine 16/1.0, ACR 3.7 02/24/2012: A1c 8.4%, vitamin B12 321 06/01/2012: 7.6% 01/26/2013: 7.3% - received records from [place]   Pt is on a regimen of: - Janumet XR 1000-50 mg po >> taken all in am now - Januvia part started 02/2012 - Glipizide ER 5 mg - Invokana 300 mg >> tolerates it well  Pt checks his sugars 2x a day and they are: - am: 140-180 >> 98-153 >> 93-134 >> (73) 120-150 - 2h after b'fast: 113 >> n/c - before lunch: 95-134 >> 140 - 2h after lunch: 80 >> n/c - before dinner: 70, 110, 160 >> 86-147 >> n/c - usually 1h after dinner 120-220 >> n/c - 2h after dinner: 106-189 (one at 276 - cake, one at 226 - pizza) >> 111-163 >> 97-180 - bedtime: 125-192 >> 126-165 >> n/c - at 11 pm: 90 >> 90, 118, 181 No lows. Lowest sugar was 72; he has hypoglycemia awareness at 70. Highest sugar was 249 x1. He exercises by going to the gym 3x a week.  - has mild CKD. Last BUN/creatinine:  19/1.2 (GFR 63.4) in 01/26/2013  16/1.0 in 05/2012.  Last ACR 12 in 01/26/2013. He is on Lisinopril 2.5.   - has HL. Last set of lipids:  212/249/29/106 (01/26/2013)  200/214/26.8/130.4 previously He switched from Lipitor 40 to Crestor 20 mg daily after the previous results.   He is on ASA 81.  - last eye exam was 03/2012, has them yearly - [name]. No DR. >> will have a new one on 07/13/2013. - + numbness  and tingling in his feet. He is on Neurontin 300 mg 2x a day.  He also has a history of PTSD ([employer] veteran), HTN, HL, h/o L TKR.  I reviewed pt's medications, allergies, PMH, social hx, family hx and no changes required, except as mentioned above.  ROS: Constitutional: no weight gain/loss, no fatigue, no subjective hyperthermia/hypothermia, + nocturia Eyes: no blurry vision, no xerophthalmia ENT: no sore throat, no nodules palpated in throat, no dysphagia/odynophagia, no hoarseness Cardiovascular: no CP/SOB/palpitations/leg swelling Respiratory: no cough/SOB Gastrointestinal: no N/V/D/C Musculoskeletal: no muscle/+ joint aches (shoulders and neck) >> improved Skin: no rash Neurological: no tremors/numbness/tingling/dizziness  PE: BP 112/62  Pulse 85  Temp(Src) 98.1 F (36.7 C) (Oral)  Resp 12  Wt 194 lb (87.998 kg)  SpO2 94% Body mass index is 29.5 kg/(m^2).  Wt Readings from Last 3 Encounters:  05/30/13 194 lb (87.998 kg)  02/27/13 193 lb 14.4 oz (87.952 kg)  11/17/12 190 lb 8 oz (86.41 kg)   Constitutional: overweight, in NAD Eyes: PERRLA, EOMI, no exophthalmos ENT: moist mucous membranes, no thyromegaly, no cervical lymphadenopathy Cardiovascular: RRR, No MRG Respiratory: CTA B Gastrointestinal: abdomen soft, NT, ND, BS+ Musculoskeletal: no deformities, strength intact in all 4 Skin: moist, warm, no rashes  ASSESSMENT: 1. DM2, non-insulin-dependent, uncontrolled, with complications - CAD, s/p AMI and  5v CABG at [facility] 08/29/2010 - Peripheral neuropathy - ED  PLAN:  1. Patient with long-standing, recently more controlled diabetes. - we reviewed his sugar log together and I suggested to:   Continue JanuMet 1000-50 mg - all at once in am  Continue Glipizide XL 5 mg daily  Continue Invokana to 300 mg daily in am  - continue check 2 times a day, rotating checks - refilled his meds at last visit - will check HbA1c and BMP since on Invokana - Return to clinic in  3 months with sugar log   2. Peripheral neuropathy - on Neurontin to 300 mg bid - On Metanx generic now  CC: Dr. Ronnie Doss, [place] number [phone]  Office Visit on 05/30/2013  Component Date Value Ref Range Status  . Hemoglobin A1C 05/30/2013 7.3* 4.6 - 6.5 % Final   Glycemic Control Guidelines for People with Diabetes:Non Diabetic:  <6%Goal of Therapy: <7%Additional Action Suggested:  >8%   . Sodium 05/30/2013 137  135 - 145 mEq/L Final  . Potassium 05/30/2013 4.2  3.5 - 5.1 mEq/L Final  . Chloride 05/30/2013 104  96 - 112 mEq/L Final  . CO2 05/30/2013 23  19 - 32 mEq/L Final  . Glucose, Bld 05/30/2013 118* 70 - 99 mg/dL Final  . BUN 05/30/2013 19  6 - 23 mg/dL Final  . Creatinine, Ser 05/30/2013 1.1  0.4 - 1.5 mg/dL Final  . Calcium 05/30/2013 9.2  8.4 - 10.5 mg/dL Final  . GFR 05/30/2013 73.06  >60.00 mL/min Final   HbA1c improved further. Cr a little lower than before.

## 2013-06-01 DIAGNOSIS — L570 Actinic keratosis: Secondary | ICD-10-CM | POA: Diagnosis not present

## 2013-06-01 DIAGNOSIS — L821 Other seborrheic keratosis: Secondary | ICD-10-CM | POA: Diagnosis not present

## 2013-06-01 DIAGNOSIS — L578 Other skin changes due to chronic exposure to nonionizing radiation: Secondary | ICD-10-CM | POA: Diagnosis not present

## 2013-06-01 DIAGNOSIS — D239 Other benign neoplasm of skin, unspecified: Secondary | ICD-10-CM | POA: Diagnosis not present

## 2013-06-01 DIAGNOSIS — L820 Inflamed seborrheic keratosis: Secondary | ICD-10-CM | POA: Diagnosis not present

## 2013-07-13 DIAGNOSIS — Z961 Presence of intraocular lens: Secondary | ICD-10-CM | POA: Diagnosis not present

## 2013-07-13 DIAGNOSIS — H40019 Open angle with borderline findings, low risk, unspecified eye: Secondary | ICD-10-CM | POA: Diagnosis not present

## 2013-07-13 DIAGNOSIS — D3130 Benign neoplasm of unspecified choroid: Secondary | ICD-10-CM | POA: Diagnosis not present

## 2013-07-13 LAB — HM DIABETES EYE EXAM

## 2013-07-20 DIAGNOSIS — L570 Actinic keratosis: Secondary | ICD-10-CM | POA: Diagnosis not present

## 2013-07-24 DIAGNOSIS — G589 Mononeuropathy, unspecified: Secondary | ICD-10-CM | POA: Diagnosis not present

## 2013-07-24 DIAGNOSIS — E785 Hyperlipidemia, unspecified: Secondary | ICD-10-CM | POA: Diagnosis not present

## 2013-07-24 DIAGNOSIS — E119 Type 2 diabetes mellitus without complications: Secondary | ICD-10-CM | POA: Diagnosis not present

## 2013-07-24 DIAGNOSIS — I10 Essential (primary) hypertension: Secondary | ICD-10-CM | POA: Diagnosis not present

## 2013-08-21 DIAGNOSIS — H524 Presbyopia: Secondary | ICD-10-CM | POA: Diagnosis not present

## 2013-08-21 DIAGNOSIS — E119 Type 2 diabetes mellitus without complications: Secondary | ICD-10-CM | POA: Diagnosis not present

## 2013-08-21 DIAGNOSIS — Z961 Presence of intraocular lens: Secondary | ICD-10-CM | POA: Diagnosis not present

## 2013-08-21 DIAGNOSIS — H35379 Puckering of macula, unspecified eye: Secondary | ICD-10-CM | POA: Diagnosis not present

## 2013-08-21 DIAGNOSIS — H26499 Other secondary cataract, unspecified eye: Secondary | ICD-10-CM | POA: Diagnosis not present

## 2013-08-31 ENCOUNTER — Other Ambulatory Visit: Payer: Self-pay | Admitting: *Deleted

## 2013-08-31 ENCOUNTER — Ambulatory Visit (INDEPENDENT_AMBULATORY_CARE_PROVIDER_SITE_OTHER): Payer: Medicare Other | Admitting: Internal Medicine

## 2013-08-31 ENCOUNTER — Encounter: Payer: Self-pay | Admitting: Internal Medicine

## 2013-08-31 VITALS — BP 136/70 | HR 71 | Temp 97.9°F | Resp 12 | Wt 197.0 lb

## 2013-08-31 DIAGNOSIS — E119 Type 2 diabetes mellitus without complications: Secondary | ICD-10-CM | POA: Diagnosis not present

## 2013-08-31 LAB — HEMOGLOBIN A1C: HEMOGLOBIN A1C: 7.1 % — AB (ref 4.6–6.5)

## 2013-08-31 MED ORDER — GLUCOSE BLOOD VI STRP: ORAL_STRIP | Status: DC

## 2013-08-31 NOTE — Progress Notes (Signed)
Patient ID: William Walton, male   DOB: 1940/08/22, 73 y.o.   MRN: [id number]  HPI: William Walton is a 73 y.o.-year-old male, returning for f/u for DM2, dx 2010, non-insulin-dependent, uncontrolled, with complications (CAD - s/p CABG, peripheral neuropathy, ED), likely 2/2 Agent Orange. Also sees [name] at the [facility] ([place]). Last visit with me was 3 mo ago.  HbA1C levels: Lab Results  Component Value Date   HGBA1C 7.3* 05/30/2013   HGBA1C 7.7* 10/18/2012   02/20/2011: A1c 7.1% 05/22/2011: A1c 6.8%, after which he was taken off the insulin >> A1C started to increase.  11/19/2011: A1c 8.5%, TSH 2.18, BUN/creatinine 16/1.0, ACR 3.7 02/24/2012: A1c 8.4%, vitamin B12 321 06/01/2012: 7.6% 01/26/2013: 7.3% - received records from [place]   Pt is on a regimen of: - Janumet XR 1000-50 mg po >> taken all in am now - Januvia part started 02/2012 - Glipizide ER 5 mg in am and 10 mg in pm - Invokana 300 mg >> tolerates it well  Pt checks his sugars 2x a day and they are: - am: 140-180 >> 98-153 >> 93-134 >> (73) 120-150 >> 104-140s (before last mo, he also had 180s-190) - 2h after b'fast: 113 >> n/c - before lunch: 95-134 >> 140 >> 80, 139, 149 - 2h after lunch: 80 >> n/c >> 78 - before dinner: 70, 110, 160 >> 86-147 >> n/c >> 69, 91, 222 - usually 1h after dinner 120-220 >> n/c  - 2h after dinner: 106-189 (one at 276 - cake, one at 226 - pizza) >> 111-163 >> 97-180 >> 75-200 (250x1) - bedtime: 125-192 >> 126-165 >> n/c >> 94-198 (263) - at 11 pm: 90 >> 90, 118, 181 No lows. Lowest sugar was 69; he has hypoglycemia awareness at 70. Highest sugar was 263 x 1. He exercises by going to the gym 3x a week.  - has mild CKD. Last BUN/creatinine:  Lab Results  Component Value Date   BUN 19 05/30/2013   BUN 13 10/17/2010   CREATININE 1.1 05/30/2013   CREATININE 0.9 10/17/2010   19/1.2 (GFR 63.4) in 01/26/2013  16/1.0 in 05/2012.  Last ACR 12 in 01/26/2013. He is on Lisinopril  2.5.   - has HL. Last set of lipids:  212/249/29/106 (01/26/2013)  200/214/26.8/130.4 previously He switched from Lipitor 40 to Crestor 20 mg daily after the previous results.   He is on ASA 81.  - last eye exam was 07/13/2013 (has them yearly) - [name]. No DR. - + numbness and tingling in his feet. He is on Neurontin 300 mg 2x a day.  He also has a history of PTSD ([employer] veteran), HTN, HL, h/o L TKR.  I reviewed pt's medications, allergies, PMH, social hx, family hx and no changes required, except as mentioned above.  ROS: Constitutional: no weight gain/loss, no fatigue, no subjective hyperthermia/hypothermia, + nocturia Eyes: no blurry vision, no xerophthalmia ENT: no sore throat, no nodules palpated in throat, no dysphagia/odynophagia, no hoarseness Cardiovascular: no CP/SOB/palpitations/leg swelling Respiratory: no cough/SOB Gastrointestinal: no N/V/D/C Musculoskeletal: no muscle/+ joint aches (shoulders and neck) Skin: no rash Neurological: no tremors/numbness/tingling/dizziness  PE: BP 136/70  Pulse 71  Temp(Src) 97.9 F (36.6 C) (Oral)  Resp 12  Wt 197 lb (89.359 kg)  SpO2 95% Body mass index is 29.96 kg/(m^2).  Wt Readings from Last 3 Encounters:  08/31/13 197 lb (89.359 kg)  05/30/13 194 lb (87.998 kg)  02/27/13 193 lb 14.4 oz (87.952 kg)  Constitutional: overweight, in NAD Eyes: PERRLA, EOMI, no exophthalmos ENT: moist mucous membranes, no thyromegaly, no cervical lymphadenopathy Cardiovascular: RRR, No MRG Respiratory: CTA B Gastrointestinal: abdomen soft, NT, ND, BS+ Musculoskeletal: no deformities, strength intact in all 4 Skin: moist, warm, no rashes  ASSESSMENT: 1. DM2, non-insulin-dependent, uncontrolled, with complications - CAD, s/p AMI and 5v CABG at [facility] 08/29/2010 - Peripheral neuropathy - ED  PLAN:  1. Patient with long-standing, recently more controlled diabetes. - we reviewed his sugar log together >> he has several spikes which  he can usually explain. He also has occasional lower sugars but this happen when he skips a meal. - I suggested to:  Patient Instructions  - Please continue Janumet XR 1000-50 mg - Continue Glipizide ER 5 mg in am and 10 mg in pm, but let me know if you have more low sugars <80 - in this case, we need to reduce the dose - Continue Invokana 300 mg Please return in 3 months with your sugar log.  Please stop at the lab. - continue check 2 times a day, rotating checks - will check HbA1c today - Return to clinic in 3 months with sugar log   2. Peripheral neuropathy - on Neurontin to 300 mg bid - On Metanx generic  CC: Dr. Ronnie Doss, [place] number [phone]  . Hemoglobin A1C 08/31/2013 7.1* 4.6 - 6.5 % Final   Glycemic Control Guidelines for People with Diabetes:Non Diabetic:  <6%Goal of Therapy: <7%Additional Action Suggested:  >8%    Excellent A1c!

## 2013-08-31 NOTE — Patient Instructions (Addendum)
-   Please continue Janumet XR 1000-50 mg - Continue Glipizide ER 5 mg in am and 10 mg in pm, but let me know if you have more low sugars <80 - in this case, we need to reduce the dose - Continue Invokana 300 mg  Please return in 3 months with your sugar log.   Please stop at the lab.

## 2013-09-04 DIAGNOSIS — H26499 Other secondary cataract, unspecified eye: Secondary | ICD-10-CM | POA: Diagnosis not present

## 2013-09-25 ENCOUNTER — Other Ambulatory Visit: Payer: Self-pay | Admitting: Internal Medicine

## 2013-10-09 ENCOUNTER — Other Ambulatory Visit: Payer: Self-pay | Admitting: Internal Medicine

## 2013-10-09 DIAGNOSIS — E1059 Type 1 diabetes mellitus with other circulatory complications: Secondary | ICD-10-CM

## 2013-11-01 ENCOUNTER — Other Ambulatory Visit: Payer: Self-pay | Admitting: Internal Medicine

## 2013-11-20 ENCOUNTER — Encounter: Payer: Self-pay | Admitting: Family Medicine

## 2013-11-21 MED ORDER — GABAPENTIN 300 MG PO CAPS
900.0000 mg | ORAL_CAPSULE | Freq: Two times a day (BID) | ORAL | Status: DC
Start: 2013-11-21 — End: 2014-10-10

## 2013-11-23 DIAGNOSIS — L570 Actinic keratosis: Secondary | ICD-10-CM | POA: Diagnosis not present

## 2013-11-23 DIAGNOSIS — L578 Other skin changes due to chronic exposure to nonionizing radiation: Secondary | ICD-10-CM | POA: Diagnosis not present

## 2013-12-01 ENCOUNTER — Ambulatory Visit (INDEPENDENT_AMBULATORY_CARE_PROVIDER_SITE_OTHER): Payer: Medicare Other | Admitting: Internal Medicine

## 2013-12-01 ENCOUNTER — Encounter: Payer: Self-pay | Admitting: Internal Medicine

## 2013-12-01 VITALS — BP 114/62 | HR 70 | Temp 97.6°F | Resp 12 | Wt 197.0 lb

## 2013-12-01 DIAGNOSIS — E1169 Type 2 diabetes mellitus with other specified complication: Secondary | ICD-10-CM | POA: Insufficient documentation

## 2013-12-01 DIAGNOSIS — E1159 Type 2 diabetes mellitus with other circulatory complications: Secondary | ICD-10-CM | POA: Diagnosis not present

## 2013-12-01 LAB — HEMOGLOBIN A1C: HEMOGLOBIN A1C: 7.6 % — AB (ref 4.6–6.5)

## 2013-12-01 NOTE — Progress Notes (Signed)
Patient ID: William Walton, male   DOB: 1940/04/24, 73 y.o.   MRN: [id number]  HPI: William Walton is a 73 y.o.-year-old male, returning for f/u for DM2, dx 2010, non-insulin-dependent, uncontrolled, with complications (CAD - s/p CABG, peripheral neuropathy, ED), likely 2/2 Agent Orange. Also sees [name] at the [facility] ([place]). Last visit with me was 3 mo ago.  HbA1C levels: Lab Results  Component Value Date   HGBA1C 7.1* 08/31/2013   HGBA1C 7.3* 05/30/2013   HGBA1C 7.7* 10/18/2012   02/20/2011: A1c 7.1% 05/22/2011: A1c 6.8%, after which he was taken off the insulin >> A1C started to increase.  11/19/2011: A1c 8.5%, TSH 2.18, BUN/creatinine 16/1.0, ACR 3.7 02/24/2012: A1c 8.4%, vitamin B12 321 06/01/2012: 7.6% 01/26/2013: 7.3% - received records from [place]   Pt is on a regimen of: - Janumet XR 1000-50 mg x 2 po - Glipizide ER 5 mg in am and 10 mg in pm - Invokana 300 mg >> tolerates it well  Pt checks his sugars 2x a day and they are: - am: 140-180 >> 98-153 >> 93-134 >> (73) 120-150 >> 104-140s (before last mo, he also had 180s-190) >> 108-140, 160 - 2h after b'fast: 113 >> n/c - before lunch: 95-134 >> 140 >> 80, 139, 149 >> n/c - 2h after lunch: 80 >> n/c >> 78 >> n/c - before dinner: 70, 110, 160 >> 86-147 >> n/c >> 69, 91, 222 >> 120 - usually 1h after dinner 120-220 >> n/c  - 2h after dinner: 106-189 (one at 276 - cake, one at 226 - pizza) >> 111-163 >> 97-180 >> 75-200 (250x1) >> 113-152, 174 - bedtime: 125-192 >> 126-165 >> n/c >> 94-198 (263) >> 118-174 - at 11 pm: 90 >> 90, 118, 181 >> 167 No lows. Lowest sugar was 108; he has hypoglycemia awareness at 70. Highest sugar was 263 >> 174. He exercises by going to the gym 3x a week.  - has mild CKD. Last BUN/creatinine:  Lab Results  Component Value Date   BUN 19 05/30/2013   BUN 13 10/17/2010   CREATININE 1.1 05/30/2013   CREATININE 0.9 10/17/2010  19/1.2 (GFR 63.4) in 01/26/2013  16/1.0 in 05/2012.   Last ACR 12 in 01/26/2013. He is on Lisinopril 2.5.   - has HL. Last set of lipids:  212/249/29/106 (01/26/2013)  200/214/26.8/130.4 previously He switched from Lipitor 40 to Crestor 20 mg daily after the previous results. He continues Crestor.  He is on ASA 81.  - last eye exam was 07/13/2013 (has them yearly) - [name]. No DR. - + numbness and tingling in his feet. He is on Neurontin 300 mg 2x a day.  He also has a history of PTSD ([employer] veteran), HTN, HL, h/o L TKR.  I reviewed pt's medications, allergies, PMH, social hx, family hx and no changes required, except as mentioned above.  ROS: Constitutional: no weight gain/loss, no fatigue, no subjective hyperthermia/hypothermia, + poor sleep Eyes: no blurry vision, no xerophthalmia ENT: no sore throat, no nodules palpated in throat, no dysphagia/odynophagia, no hoarseness Cardiovascular: no CP/SOB/palpitations/leg swelling Respiratory: no cough/SOB Gastrointestinal: no N/V/D/C Musculoskeletal: no muscle/+ joint aches (shoulders and neck) Skin: no rash Neurological: no tremors/numbness/tingling/dizziness  PE: BP 114/62  Pulse 70  Temp(Src) 97.6 F (36.4 C) (Oral)  Resp 12  Wt 197 lb (89.359 kg)  SpO2 97% Body mass index is 29.96 kg/(m^2).  Wt Readings from Last 3 Encounters:  12/01/13 197 lb (89.359 kg)  08/31/13 197 lb (89.359 kg)  05/30/13 194 lb (87.998 kg)   Constitutional: overweight, in NAD Eyes: PERRLA, EOMI, no exophthalmos ENT: moist mucous membranes, no thyromegaly, no cervical lymphadenopathy Cardiovascular: RRR, No MRG Respiratory: CTA B Gastrointestinal: abdomen soft, NT, ND, BS+ Musculoskeletal: no deformities, strength intact in all 4 Skin: moist, warm, no rashes  ASSESSMENT: 1. DM2, non-insulin-dependent, uncontrolled, with complications - CAD, s/p AMI and 5v CABG at [facility] 08/29/2010 - Peripheral neuropathy - ED  PLAN:  1. Patient with long-standing, well controlled diabetes. - we  reviewed his sugar log together >> no hypo or hyper-glycemia - I suggested to continue current regimen:  Patient Instructions  - Please continue Janumet XR 1000-50 mg - Continue Glipizide ER 5 mg in am and 10 mg in pm - Continue Invokana 300 mg Please return in 3 months with your sugar log.  Please stop at the lab. - continue check 2 times a day, rotating checks - will check HbA1c today - he had the PNA and flu vaccine this season - he will have a visit with [name] in 01/2014 >> will bring me his BMP and lipid results at next visit - he brings a form (diabetes eval for the [place] system) that was filled for him during the visit - up to date with eye exams - Return to clinic in 3 months with sugar log   2. Peripheral neuropathy - on Neurontin to 300 mg bid - On Metanx generic - continue above, no recent exacerbations  CC: Dr. Ronnie Doss, [place] number [phone]   Office Visit on 12/01/2013  Component Date Value Ref Range Status  . Hemoglobin A1C 12/01/2013 7.6* 4.6 - 6.5 % Final   Glycemic Control Guidelines for People with Diabetes:Non Diabetic:  <6%Goal of Therapy: <7%Additional Action Suggested:  >8%    HbA1c higher, but he had fluctuating HbA1c in the past. Will encourage diet and exercise and we may need med regimen intensification at next visit.

## 2013-12-01 NOTE — Patient Instructions (Signed)
-   Please continue Janumet XR 1000-50 mg 2 tablets in am - Continue Glipizide ER 5 mg in am and 10 mg in pm - Continue Invokana 300 mg in am Please return in 3 months with your sugar log.  Please stop at the lab.

## 2013-12-06 ENCOUNTER — Other Ambulatory Visit: Payer: Self-pay | Admitting: Internal Medicine

## 2014-01-12 ENCOUNTER — Other Ambulatory Visit: Payer: Self-pay | Admitting: Internal Medicine

## 2014-01-29 DIAGNOSIS — G4733 Obstructive sleep apnea (adult) (pediatric): Secondary | ICD-10-CM | POA: Diagnosis not present

## 2014-01-29 DIAGNOSIS — E669 Obesity, unspecified: Secondary | ICD-10-CM | POA: Diagnosis not present

## 2014-01-29 DIAGNOSIS — I2510 Atherosclerotic heart disease of native coronary artery without angina pectoris: Secondary | ICD-10-CM | POA: Diagnosis not present

## 2014-01-29 DIAGNOSIS — E785 Hyperlipidemia, unspecified: Secondary | ICD-10-CM | POA: Diagnosis not present

## 2014-03-05 ENCOUNTER — Ambulatory Visit: Payer: Medicare Other | Admitting: Internal Medicine

## 2014-03-13 ENCOUNTER — Ambulatory Visit: Payer: Medicare Other | Admitting: Internal Medicine

## 2014-03-23 ENCOUNTER — Encounter: Payer: Self-pay | Admitting: Internal Medicine

## 2014-03-23 ENCOUNTER — Ambulatory Visit (INDEPENDENT_AMBULATORY_CARE_PROVIDER_SITE_OTHER): Payer: Medicare Other | Admitting: Internal Medicine

## 2014-03-23 VITALS — BP 118/64 | HR 91 | Temp 97.7°F | Resp 12 | Wt 196.0 lb

## 2014-03-23 DIAGNOSIS — E1159 Type 2 diabetes mellitus with other circulatory complications: Secondary | ICD-10-CM

## 2014-03-23 DIAGNOSIS — E785 Hyperlipidemia, unspecified: Secondary | ICD-10-CM

## 2014-03-23 DIAGNOSIS — IMO0002 Reserved for concepts with insufficient information to code with codable children: Secondary | ICD-10-CM

## 2014-03-23 DIAGNOSIS — G629 Polyneuropathy, unspecified: Secondary | ICD-10-CM

## 2014-03-23 DIAGNOSIS — E1151 Type 2 diabetes mellitus with diabetic peripheral angiopathy without gangrene: Secondary | ICD-10-CM

## 2014-03-23 DIAGNOSIS — E1165 Type 2 diabetes mellitus with hyperglycemia: Secondary | ICD-10-CM

## 2014-03-23 LAB — LIPID PANEL
CHOLESTEROL: 153 mg/dL (ref 0–200)
HDL: 31.4 mg/dL — AB (ref >39.00)
NONHDL: 121.6
TRIGLYCERIDES: 325 mg/dL — AB (ref 0.0–149.0)
Total CHOL/HDL Ratio: 5
VLDL: 65 mg/dL — AB (ref 0.0–40.0)

## 2014-03-23 LAB — HEMOGLOBIN A1C: HEMOGLOBIN A1C: 7.5 % — AB (ref 4.6–6.5)

## 2014-03-23 LAB — LDL CHOLESTEROL, DIRECT: Direct LDL: 86 mg/dL

## 2014-03-23 NOTE — Progress Notes (Signed)
Patient ID: William Walton, male   DOB: Dec 18, 1940, 74 y.o.   MRN: [id number]  HPI: William Walton is a 74 y.o.-year-old male, returning for f/u for DM2, dx 2010, non-insulin-dependent, uncontrolled, with complications (CAD - s/p CABG, peripheral neuropathy, ED), likely 2/2 Agent Orange. Also sees [name] at the [facility] ([place]). Last visit with me was 4 mo ago.  HbA1C levels: Lab Results  Component Value Date   HGBA1C 7.6* 12/01/2013   HGBA1C 7.1* 08/31/2013   HGBA1C 7.3* 05/30/2013   02/20/2011: A1c 7.1% 05/22/2011: A1c 6.8%, after which he was taken off the insulin >> A1C started to increase.  11/19/2011: A1c 8.5%, TSH 2.18, BUN/creatinine 16/1.0, ACR 3.7 02/24/2012: A1c 8.4%, vitamin B12 321 06/01/2012: 7.6% 01/26/2013: 7.3% - received records from [place]   Pt is on a regimen of: - Janumet XR 1000-50 mg x 2 po - Glipizide ER 5 mg in am and 10 mg in pm - Invokana 300 mg >> tolerates it well  Pt checks his sugars 2x a day and they are: - am: 104-140s (before last mo, he also had 180s-190) >> 108-140, 160 >> 102-150s, some 180s - 2h after b'fast: 113 >> n/c - before lunch: 95-134 >> 140 >> 80, 139, 149 >> n/c >> 102 - 2h after lunch: 80 >> n/c >> 78 >> n/c - before dinner: 70, 110, 160 >> 86-147 >> n/c >> 69, 91, 222 >> 120 >> n/c - usually 1h after dinner 120-220 >> n/c  - 2h after dinner:  111-163 >> 97-180 >> 75-200 (250x1) >> 113-152, 174 >> 92-134-185, 213 after large dinner - bedtime: 125-192 >> 126-165 >> n/c >> 94-198 (263) >> 118-174 - at 11 pm: 90 >> 90, 118, 181 >> 167 >> n/c No lows. Lowest sugar was 108; he has hypoglycemia awareness at 70. Highest sugar was 213.  He exercises by going to the gym 3x a week.  - has mild CKD. Last BUN/creatinine:  Lab Results  Component Value Date   BUN 19 05/30/2013   BUN 13 10/17/2010   CREATININE 1.1 05/30/2013   CREATININE 0.9 10/17/2010  19/1.2 (GFR 63.4) in 01/26/2013  16/1.0 in 05/2012.  Last ACR 12 in  01/26/2013. He is on Lisinopril 2.5.   - has HL. Last set of lipids:   212/249/29/106 (01/26/2013)  200/214/26.8/130.4 previously He switched from Lipitor 40 to Crestor 20 mg daily after the previous results. He continues Crestor.  He is on ASA 81.  - last eye exam was 07/13/2013 (has them yearly) - [name]. No DR. - + numbness and tingling in his feet. He is on Neurontin 300 mg, now 3x a day, and Metanx.  He also has a history of PTSD ([employer] veteran), HTN, HL, h/o L TKR.  I reviewed pt's medications, allergies, PMH, social hx, family hx and no changes required, except as mentioned above.  ROS: Constitutional: no weight gain/loss, + fatigue, no subjective hyperthermia/hypothermia, + poor sleep Eyes: no blurry vision, no xerophthalmia ENT: no sore throat, no nodules palpated in throat, no dysphagia/odynophagia, no hoarseness Cardiovascular: no CP/SOB/palpitations/leg swelling Respiratory: no cough/SOB Gastrointestinal: no N/V/D/C Musculoskeletal: no muscle/+ joint aches (shoulders and neck) Skin: no rash Neurological: no tremors/numbness/tingling/dizziness  PE: BP 118/64 mmHg  Pulse 91  Temp(Src) 97.7 F (36.5 C) (Oral)  Resp 12  Wt 196 lb (88.905 kg)  SpO2 95% Body mass index is 29.81 kg/(m^2).  Wt Readings from Last 3 Encounters:  03/23/14 196 lb (88.905 kg)  12/01/13 197 lb (89.359 kg)  08/31/13 197 lb (89.359 kg)   Constitutional: overweight, in NAD Eyes: PERRLA, EOMI, no exophthalmos ENT: moist mucous membranes, no thyromegaly, no cervical lymphadenopathy Cardiovascular: RRR, No MRG Respiratory: CTA B Gastrointestinal: abdomen soft, NT, ND, BS+ Musculoskeletal: no deformities, strength intact in all 4 Skin: moist, warm, no rashes  ASSESSMENT: 1. DM2, non-insulin-dependent, uncontrolled, with complications - CAD, s/p AMI and 5v CABG at [facility] 08/29/2010 - Peripheral neuropathy - ED  2. PN  PLAN:  1. Patient with long-standing, fairly well controlled  diabetes. - we reviewed his sugar log together >> no major fluctuations. Discussed about the importance of diet in controlling sugars.We also discussed about a good HbA1c target for him of ~7.5% due to age. - I suggested to continue current regimen:  Patient Instructions  - Please continue Janumet XR 1000-50 mg 2 tablets in am - Continue Glipizide ER 5 mg in am and 10 mg in pm - Continue Invokana 300 mg in am  Please return in 3 months with your sugar log.   Please stop at the lab.  - continue check 2 times a day, rotating checks - will check HbA1c today - he had the PNA and flu vaccine this season - he will have a visit with [name] in 01/2014 >> advised him to ask him to send me his latest BMP and lipid results - up to date with eye exams - Return to clinic in 3 months with sugar log   2. Peripheral neuropathy - on Neurontin to 300 mg bid >> now tid - On Metanx generic - continue above, no recent exacerbations, but has "good days and bad days"  CC: Dr. Ronnie Doss, [place] number [phone]  Office Visit on 03/23/2014  Component Date Value Ref Range Status  . Cholesterol 03/23/2014 153  0 - 200 mg/dL Final   ATP III Classification       Desirable:  < 200 mg/dL               Borderline High:  200 - 239 mg/dL          High:  > = 240 mg/dL  . Triglycerides 03/23/2014 325.0* 0.0 - 149.0 mg/dL Final   Normal:  <150 mg/dLBorderline High:  150 - 199 mg/dL  . HDL 03/23/2014 31.40* >39.00 mg/dL Final  . VLDL 03/23/2014 65.0* 0.0 - 40.0 mg/dL Final  . Total CHOL/HDL Ratio 03/23/2014 5   Final                  Men          Women1/2 Average Risk     3.4          3.3Average Risk          5.0          4.42X Average Risk          9.6          7.13X Average Risk          15.0          11.0                      . NonHDL 03/23/2014 121.60   Final   NOTE:  Non-HDL goal should be 30 mg/dL higher than patient's LDL goal (i.e. LDL goal of < 70 mg/dL, would have non-HDL goal of < 100 mg/dL)   . Hgb A1c MFr Bld 03/23/2014 7.5* 4.6 -  6.5 % Final   Glycemic Control Guidelines for People with Diabetes:Non Diabetic:  <6%Goal of Therapy: <7%Additional Action Suggested:  >8%   . Direct LDL 03/23/2014 86.0   Final   Optimal:  <100 mg/dLNear or Above Optimal:  100-129 mg/dLBorderline High:  130-159 mg/dLHigh:  160-189 mg/dLVery High:  >190 mg/dL   TG high. LDL at goal. HbA1c stable.

## 2014-03-23 NOTE — Patient Instructions (Signed)
-   Please continue Janumet XR 1000-50 mg x 2 tablets in am - Continue Glipizide ER 5 mg in am and 10 mg in pm - Continue Invokana 300 mg in am  Please return in 3 months with your sugar log.   Please stop at the lab.

## 2014-03-27 ENCOUNTER — Other Ambulatory Visit: Payer: Self-pay | Admitting: Internal Medicine

## 2014-03-28 ENCOUNTER — Other Ambulatory Visit: Payer: Self-pay | Admitting: Internal Medicine

## 2014-04-27 ENCOUNTER — Other Ambulatory Visit: Payer: Self-pay | Admitting: Internal Medicine

## 2014-05-10 ENCOUNTER — Encounter: Payer: Self-pay | Admitting: Family Medicine

## 2014-05-10 ENCOUNTER — Ambulatory Visit (INDEPENDENT_AMBULATORY_CARE_PROVIDER_SITE_OTHER): Payer: Medicare Other | Admitting: Family Medicine

## 2014-05-10 VITALS — BP 110/60 | HR 93 | Temp 97.6°F | Resp 18 | Wt 196.0 lb

## 2014-05-10 DIAGNOSIS — M545 Low back pain, unspecified: Secondary | ICD-10-CM | POA: Insufficient documentation

## 2014-05-10 MED ORDER — TRAMADOL HCL 50 MG PO TABS: 50.0000 mg | ORAL_TABLET | Freq: Two times a day (BID) | ORAL | Status: DC | PRN

## 2014-05-10 NOTE — Assessment & Plan Note (Addendum)
Anticipate lumbar strain, but also has evidence of significant hip joint arthritis and lumbar degenerative arthritis. Treat pain with continued tylenol with tramadol for breakthrough pain. Discussed heating pad/ ice, and gentle stretching. Provided with stretching exercises from SM pt advisor on lower back pain. Update if not improved for baseline lumbar spine xrays (pt declines xrays today) Avoid NSAIDs given DM and CAD hx.

## 2014-05-10 NOTE — Progress Notes (Signed)
BP 110/60 mmHg  Pulse 93  Temp(Src) 97.6 F (36.4 C) (Oral)  Resp 18  Wt 196 lb (88.905 kg)  SpO2 93%   CC: LBP x 4 days  Subjective:    Patient ID: William Walton, male    DOB: 1940-07-09, 74 y.o.   MRN: [id number]  HPI: William Walton is a 74 y.o. male presenting on 05/10/2014 for Back Pain   Seen by [facility] every 6 months. H/o agent orange exposure.   Ongoing trouble with lower back pain at an intensity of 3-4/10. Monday 05/06/2013 while working in yard, repetitive bending, back pain has flared to 6-7/10. Occasional sharp pain to lower back with certain movements. More pain when stands fully upright. Points to L lower back.   Denies inciting trauma/injury. No fevers/chills, shooting pain or numbness/weakness down legs. No bowel/bladder incontinence.   Known diabetic neuropathy - no recent change.  So far has tried tylenol which helped.   H/o arthritis in neck and shoulders, sees [place] for this.   Relevant past medical, surgical, family and social history reviewed and updated as indicated. Interim medical history since our last visit reviewed. Allergies and medications reviewed and updated. Current Outpatient Prescriptions on File Prior to Visit  Medication Sig  . acetaminophen (TYLENOL) 325 MG tablet Take 650 mg by mouth every 6 (six) hours as needed.    Marland Kitchen amLODipine (NORVASC) 5 MG tablet Take 5 mg by mouth daily.  Marland Kitchen aspirin 81 MG tablet Take 81 mg by mouth daily.    Marland Kitchen gabapentin (NEURONTIN) 300 MG capsule Take 3 capsules (900 mg total) by mouth 2 (two) times daily. (Patient taking differently: Take 900 mg by mouth 3 (three) times daily. )  . GLIPIZIDE XL 5 MG 24 hr tablet TAKE 1 TABLET IN THE MORNING AND 2 TABLETS IN THE EVENING  . glucose blood (FREESTYLE LITE) test strip Use to test blood sugar 2 times daily as instructed. Dx code: 250.00  . guaiFENesin-codeine (ROBITUSSIN AC) 100-10 MG/5ML syrup Take 5 mLs by mouth 2 (two) times daily as needed for cough (sedation precautions).    . INVOKANA 300 MG TABS tablet TAKE 1 TABLET EVERY MORNING  . JANUMET XR 50-1000 MG TB24 TAKE 1 TABLET TWICE A DAY  . L-Methylfolate-B6-B12 (FOLTANX) 3-35-2 MG TABS TAKE 1 TABLET TWICE A DAY  . lisinopril (PRINIVIL,ZESTRIL) 2.5 MG tablet Take 5 mg by mouth daily.   . metoprolol tartrate (LOPRESSOR) 25 MG tablet Take 0.5 tablets (12.5 mg total) by mouth 2 (two) times daily.  . prazosin (MINIPRESS) 1 MG capsule Take 1 mg by mouth at bedtime.  . rosuvastatin (CRESTOR) 20 MG tablet Take 20 mg by mouth daily.  . sertraline (ZOLOFT) 25 MG tablet Take 25 mg by mouth daily.  Marland Kitchen amoxicillin (AMOXIL) 500 MG capsule Take 2,000 mg by mouth daily. Take one hour prior to dental treatment   No current facility-administered medications on file prior to visit.    Review of Systems Per HPI unless specifically indicated above     Objective:    BP 110/60 mmHg  Pulse 93  Temp(Src) 97.6 F (36.4 C) (Oral)  Resp 18  Wt 196 lb (88.905 kg)  SpO2 93%  Wt Readings from Last 3 Encounters:  05/10/14 196 lb (88.905 kg)  03/23/14 196 lb (88.905 kg)  12/01/13 197 lb (89.359 kg)    Physical Exam  Constitutional: He appears well-developed and well-nourished. No distress.  Musculoskeletal: He exhibits no edema.  + pain midline lumbar spine +  lumbar paraspinous mm tenderness Neg SLR bilaterally. + pain with int/ext rotation at hip. Neg FABER. No pain at SIJ, GTB or sciatic notch bilaterally.   Skin: Skin is warm and dry. No rash noted.  Psychiatric: He has a normal mood and affect.  Nursing note and vitals reviewed.     Assessment & Plan:   Problem List Items Addressed This Visit    Lower back pain - Primary    Anticipate lumbar strain, but also has evidence of significant hip joint arthritis and lumbar degenerative arthritis. Treat pain with continued tylenol with tramadol for breakthrough pain. Discussed heating pad/ ice, and gentle stretching. Provided with stretching exercises from SM pt advisor  on lower back pain. Update if not improved for baseline lumbar spine xrays (pt declines xrays today) Avoid NSAIDs given DM and CAD hx.      Relevant Medications   traMADol (ULTRAM) tablet 50 mg       Follow up plan: Return if symptoms worsen or fail to improve.

## 2014-05-10 NOTE — Patient Instructions (Signed)
I think you have lumbar strain, but also significant arthritis of the lower back and hips. Continue tylenol. May use tramadol for breakthrough pain Ice or heating pad to back - whichever soothes better Stretching exercise provided for lower back. Let us know if not improving for lumbar spine xray.

## 2014-05-15 ENCOUNTER — Telehealth: Payer: Self-pay

## 2014-05-15 NOTE — Telephone Encounter (Signed)
Left message for pt to call back if he still wants flu vaccine 

## 2014-05-18 ENCOUNTER — Encounter: Payer: Self-pay | Admitting: Family Medicine

## 2014-05-18 ENCOUNTER — Ambulatory Visit (INDEPENDENT_AMBULATORY_CARE_PROVIDER_SITE_OTHER)
Admission: RE | Admit: 2014-05-18 | Discharge: 2014-05-18 | Disposition: A | Discharge status: Home / Self Care | Payer: Medicare Other | Source: Ambulatory Visit | Attending: Family Medicine | Admitting: Family Medicine

## 2014-05-18 ENCOUNTER — Ambulatory Visit (INDEPENDENT_AMBULATORY_CARE_PROVIDER_SITE_OTHER): Payer: Medicare Other | Admitting: Family Medicine

## 2014-05-18 VITALS — BP 118/70 | HR 64 | Temp 98.0°F | Wt 197.5 lb

## 2014-05-18 DIAGNOSIS — M25552 Pain in left hip: Secondary | ICD-10-CM

## 2014-05-18 DIAGNOSIS — M25551 Pain in right hip: Secondary | ICD-10-CM

## 2014-05-18 DIAGNOSIS — M170 Bilateral primary osteoarthritis of knee: Secondary | ICD-10-CM | POA: Diagnosis not present

## 2014-05-18 DIAGNOSIS — E1159 Type 2 diabetes mellitus with other circulatory complications: Secondary | ICD-10-CM | POA: Diagnosis not present

## 2014-05-18 DIAGNOSIS — M160 Bilateral primary osteoarthritis of hip: Secondary | ICD-10-CM | POA: Diagnosis not present

## 2014-05-18 DIAGNOSIS — M545 Low back pain, unspecified: Secondary | ICD-10-CM

## 2014-05-18 DIAGNOSIS — M5136 Other intervertebral disc degeneration, lumbar region: Secondary | ICD-10-CM | POA: Diagnosis not present

## 2014-05-18 DIAGNOSIS — M47819 Spondylosis without myelopathy or radiculopathy, site unspecified: Secondary | ICD-10-CM | POA: Insufficient documentation

## 2014-05-18 DIAGNOSIS — M2578 Osteophyte, vertebrae: Secondary | ICD-10-CM | POA: Diagnosis not present

## 2014-05-18 DIAGNOSIS — M5442 Lumbago with sciatica, left side: Secondary | ICD-10-CM

## 2014-05-18 DIAGNOSIS — M5441 Lumbago with sciatica, right side: Secondary | ICD-10-CM | POA: Diagnosis not present

## 2014-05-18 HISTORY — DX: Spondylosis without myelopathy or radiculopathy, site unspecified: M47.819

## 2014-05-18 MED ORDER — PREDNISONE 20 MG PO TABS: ORAL_TABLET | ORAL | Status: DC

## 2014-05-18 MED ORDER — HYDROCODONE-ACETAMINOPHEN 5-325 MG PO TABS: 1.0000 | ORAL_TABLET | Freq: Three times a day (TID) | ORAL | Status: DC | PRN

## 2014-05-18 NOTE — Assessment & Plan Note (Signed)
Discussed caution with prescribed steroid course - encouraged effort to avoid carbs and added sugars while on prednisone. If cbg's uncontrolled while on steroid, pt will call us for titration of antihyperglycemics.

## 2014-05-18 NOTE — Assessment & Plan Note (Signed)
S/p L knee replacement, but known severe R>L osteoarthritis per prior ortho Rudene Christians). If persistent pain, pt requests referral to new ortho in [place] for re evaluation. Would like to wait for now. Had difficulty with L knee replacement.

## 2014-05-18 NOTE — Progress Notes (Signed)
BP 118/70 mmHg  Pulse 64  Temp(Src) 98 F (36.7 C) (Oral)  Wt 197 lb 8 oz (89.585 kg)   CC: persistent back pain, knee pain  Subjective:    Patient ID: William Walton, male    DOB: 01/13/1941, 74 y.o.   MRN: [id number]  HPI: [name] is a 74 y.o. male presenting on 05/18/2014 for Follow-up   Seen 05/10/2014 with 4d h/o bilateral LBP without sciatica. Treated with tylenol + tramadol (ineffective) and gentle stretching with exercises and heating pad/ice without improvement. Lifting either leg causes sharp pain to shoot down legs.   Denies inciting trauma/injury. No fevers/chills, shooting pain or numbness/weakness down legs. No bowel/bladder incontinence.   Worsening knee pain R>L. S/p L knee replacement without improvement ([facility] 2014). Last saw ortho 1 yr ago.  ?spacer too thick, pt declined rpt intervention last year. If needs ortho requests new orthopedist. He did see [facility] for 2nd opinion early 2015.   Known osteoarthritis in neck shoulders and knees.  Avoids NSAIDs 2/2 DM and CAD hx.  Lab Results  Component Value Date   CREATININE 1.1 05/30/2013   Lab Results  Component Value Date   HGBA1C 7.5* 03/23/2014   Using cane.  Has taken and tolerated oxycodone well in past.  Relevant past medical, surgical, family and social history reviewed and updated as indicated. Interim medical history since our last visit reviewed. Allergies and medications reviewed and updated. Current Outpatient Prescriptions on File Prior to Visit  Medication Sig  . acetaminophen (TYLENOL) 325 MG tablet Take 650 mg by mouth every 6 (six) hours as needed.    Marland Kitchen amLODipine (NORVASC) 5 MG tablet Take 5 mg by mouth daily.  Marland Kitchen amoxicillin (AMOXIL) 500 MG capsule Take 2,000 mg by mouth daily. Take one hour prior to dental treatment  . aspirin 81 MG tablet Take 81 mg by mouth daily.    Marland Kitchen gabapentin (NEURONTIN) 300 MG capsule Take 3 capsules (900 mg total) by mouth 2 (two) times daily. (Patient taking  differently: Take 900 mg by mouth 3 (three) times daily. )  . GLIPIZIDE XL 5 MG 24 hr tablet TAKE 1 TABLET IN THE MORNING AND 2 TABLETS IN THE EVENING  . glucose blood (FREESTYLE LITE) test strip Use to test blood sugar 2 times daily as instructed. Dx code: 250.00  . guaiFENesin-codeine (ROBITUSSIN AC) 100-10 MG/5ML syrup Take 5 mLs by mouth 2 (two) times daily as needed for cough (sedation precautions).  . INVOKANA 300 MG TABS tablet TAKE 1 TABLET EVERY MORNING  . JANUMET XR 50-1000 MG TB24 TAKE 1 TABLET TWICE A DAY  . L-Methylfolate-B6-B12 (FOLTANX) 3-35-2 MG TABS TAKE 1 TABLET TWICE A DAY  . lisinopril (PRINIVIL,ZESTRIL) 2.5 MG tablet Take 5 mg by mouth daily.   . metoprolol tartrate (LOPRESSOR) 25 MG tablet Take 0.5 tablets (12.5 mg total) by mouth 2 (two) times daily.  . prazosin (MINIPRESS) 1 MG capsule Take 1 mg by mouth at bedtime.  . rosuvastatin (CRESTOR) 20 MG tablet Take 20 mg by mouth daily.  . sertraline (ZOLOFT) 25 MG tablet Take 25 mg by mouth daily.  . traMADol (ULTRAM) 50 MG tablet Take 1 tablet (50 mg total) by mouth 2 (two) times daily as needed.   No current facility-administered medications on file prior to visit.   Past Surgical History  Procedure Laterality Date  . Coronary artery bypass graft  08/2010    5v, [facility] Dr. Julaine Hua  . US echocardiography  08/2010  mild LV dysfxn (EF 45%) with severe LVH, nl R vent sys fxn  . Esophagogastroduodenoscopy  08/2010    normal esophagus, localized gastric erythema  . Cataract extraction  11/2010  . Total knee arthroplasty Left 08/2012    [facility] at [place] [facility] Systems Per HPI unless specifically indicated above     Objective:    BP 118/70 mmHg  Pulse 64  Temp(Src) 98 F (36.7 C) (Oral)  Wt 197 lb 8 oz (89.585 kg)  Wt Readings from Last 3 Encounters:  05/18/14 197 lb 8 oz (89.585 kg)  05/10/14 196 lb (88.905 kg)  03/23/14 196 lb (88.905 kg)    Physical Exam  Constitutional: He appears well-developed and  well-nourished. No distress.  Musculoskeletal: He exhibits no edema.  Mild tenderness midline lumbar spine Mild lower lumbar paraspinous mm tenderness + SLR bilaterally. Tender/stiff with int/ext rotation at hip. Neg FABER. L knee replacement R knee without tenderness to palpation, +crepitus with mild tenderness in flexion/extension. Neg mcmurray's, no popliteal fullness No pain with valgus/varus stress. No PFgrind. No abnormal patellar mobility.  Skin: Skin is warm and dry. No rash noted.  Psychiatric: He has a normal mood and affect.  Nursing note and vitals reviewed.  Results for orders placed or performed in visit on 03/23/14  Lipid panel  Result Value Ref Range   Cholesterol 153 0 - 200 mg/dL   Triglycerides 325.0 (H) 0.0 - 149.0 mg/dL   HDL 31.40 (L) >39.00 mg/dL   VLDL 65.0 (H) 0.0 - 40.0 mg/dL   Total CHOL/HDL Ratio 5    NonHDL 121.60   HgB A1c  Result Value Ref Range   Hgb A1c MFr Bld 7.5 (H) 4.6 - 6.5 %  LDL cholesterol, direct  Result Value Ref Range   Direct LDL 86.0 mg/dL      Assessment & Plan:   Problem List Items Addressed This Visit    Type 2 diabetes mellitus with other circulatory complications    Discussed caution with prescribed steroid course - encouraged effort to avoid carbs and added sugars while on prednisone. If cbg's uncontrolled while on steroid, pt will call us for titration of antihyperglycemics.      Primary osteoarthritis of both knees    S/p L knee replacement, but known severe R>L osteoarthritis per prior ortho Rudene Christians). If persistent pain, pt requests referral to new ortho in [place] for re evaluation. Would like to wait for now. Had difficulty with L knee replacement.      Low back pain - Primary    No improvement with tramadol, stretching. Now with some progressive leg pain but no numbness/weakness. ?HNP vs spondylosis causing radicular pain.  Will provide prednisone course x 1 wk and increase pain regimen to hydrocodone. Will avoid  NSAIDs for now given CAD/CABG hx. Check L spine films today. Will also check bilat hip films to eval arthritic burden today.  Pt agrees with plan.      Relevant Medications   predniSONE (DELTASONE) tablet   HYDROcodone-acetaminophen (NORCO/VICODIN) 5-325 MG per tablet   Other Relevant Orders   DG Lumbar Spine Complete    Other Visit Diagnoses    Bilateral hip pain        Relevant Orders    DG HIPS BILAT WITH PELVIS MIN 5 VIEWS        Follow up plan: Return if symptoms worsen or fail to improve.

## 2014-05-18 NOTE — Patient Instructions (Addendum)
xrays of hips and back today. Treat pain with prednisone 1 wk taper and continue tylenol.  May use hydrocodone for breakthrough pain. Update [place] with effect of prednisone.  If knee pain persistent we will refer you to the orthopedist.

## 2014-05-18 NOTE — Assessment & Plan Note (Addendum)
No improvement with tramadol, stretching. Now with some progressive leg pain but no numbness/weakness. ?HNP vs spondylosis causing radicular pain.  Will provide prednisone course x 1 wk and increase pain regimen to hydrocodone. Will avoid NSAIDs for now given CAD/CABG hx. Check L spine films today. Will also check bilat hip films to eval arthritic burden today.  Pt agrees with plan.

## 2014-05-18 NOTE — Progress Notes (Signed)
Pre visit review using our clinic review tool, if applicable. No additional management support is needed unless otherwise documented below in the visit note. 

## 2014-05-19 ENCOUNTER — Encounter: Payer: Self-pay | Admitting: Family Medicine

## 2014-05-23 ENCOUNTER — Telehealth: Payer: Self-pay | Admitting: *Deleted

## 2014-05-23 MED ORDER — EMPAGLIFLOZIN 25 MG PO TABS: 25.0000 mg | ORAL_TABLET | Freq: Every day | ORAL | Status: DC

## 2014-05-23 NOTE — Telephone Encounter (Signed)
Pt's ins requires pt to [place] a preferred medication with their step therapy program. Pt will need to switch to Jardiance 25 mg. Called pt and discussed with pt's wife (pt was not at home at the time). She said she would advise him and advise [place] to send rx for the Jardiance to [facility]. Advised her we would. Be advised.

## 2014-05-24 DIAGNOSIS — L578 Other skin changes due to chronic exposure to nonionizing radiation: Secondary | ICD-10-CM | POA: Diagnosis not present

## 2014-05-24 DIAGNOSIS — L821 Other seborrheic keratosis: Secondary | ICD-10-CM | POA: Diagnosis not present

## 2014-05-24 DIAGNOSIS — L570 Actinic keratosis: Secondary | ICD-10-CM | POA: Diagnosis not present

## 2014-06-08 NOTE — Discharge Summary (Signed)
PATIENT NAME:  William Walton, CHANCY MR#:  [id number] DATE OF BIRTH:  1940-05-16  DATE OF ADMISSION:  08/16/2012 DATE OF DISCHARGE:  08/19/2012  ADMITTING DIAGNOSIS: Left knee osteoarthritis.   DISCHARGE DIAGNOSIS: Left knee osteoarthritis.   PROCEDURE: Left total knee replacement.   SURGEON: Laurene Footman, M.D.   ASSISTANT: Rachelle Hora, PA-C.   ANESTHESIA: Spinal.   ESTIMATED BLOOD LOSS: 100 mL.  TOURNIQUET TIME: 87 minutes at 300 mmHg.   SPECIMEN: Cut ends of bone.   HISTORY OF PRESENT ILLNESS:  The patient is a 74 year old with severe left knee osteoarthritis. Dr. Rudene Christians reviewed his knee CT with him and came up with a surgical plan. He has severe medial and patellofemoral compartment osteoarthritis and extensive spurs. He has been having unrelenting pain with failure of nonoperative treatment including injections, bracing and exercises.  PHYSICAL EXAMINATION: LUNGS: Clear to auscultation.  HEART: Regular rate and rhythm.  HEENT: Normal.  MUSCULOSKELETAL: He has medial joint line tenderness with varus deformity that is not entirely passively correctable. Range of motion is 10 to 110 degrees. He has tenderness in posterior knee as well. He does have palpable pulses that are trace. He does have some diminished sensation secondary to neuropathy and the skin around the knee is intact.   HOSPITAL COURSE: The patient was admitted to the hospital on 08/16/2012. He had surgery that same day and was brought to the orthopedic floor from the PAC-U in stable condition. The patient had excellent pain control on postop day 1 and had good progress with physical therapy. On postop day 2, the patient's pain increased pain and Ultram was added in conjunction with oxycodone. He continued to do well and progressed with physical therapy and by postop day 3, on 08/19/2012, the patient was stable and ready for discharge home with home health physical therapy.  DISCHARGE INSTRUCTIONS: The patient may gradually  increase weight-bearing on the effected extremity. He needs to elevate the effected foot or leg on 1 or 2 pillows with the foot higher than the knee. He needs to wear thigh-high TED hose on both legs and remove at bedtime and replace when arising the next morning. Elevate the heels off the bed and use incentive spirometry every 1 hour while awake and encourage cough and deep breathing. He may resume a regular diet as tolerated and continue using Polar Care unit maintaining a temp between 40 and 50 degrees.  Do not get the dressing or bandage wet or dirty. Call [facility] orthopedics if the dressing gets water under it. Leave the dressing on. Call [facility] orthopedics if any of the following occur:  Bright red bleeding from the incision wound, fever above 101.5 degrees, redness, swelling or drainage at the incision. Call [facility] orthopedics if you experience any increased leg pain, numbness or weakness in your legs or bowel or bladder symptoms. He is referred to home physical therapy. He needs to contact us at [place] if he has not been contacted within 48 hours of his return home. He has a follow-up appointment in 2 weeks with [facility] orthopedics.   DISCHARGE MEDICATIONS: 1.  Gabapentin 300 mg oral capsule 1 cap orally once a day in the evening.  2.  Glipizide XL 5 mg oral tablet extended release 1 tablet orally once a day in the morning and 2 tablets in the evening.  3.  Crestor 20 mg oral tablet 1 tablet orally once a day at bedtime. 4.  Zosyn 1 mg oral capsule  2 caps orally once a day at bedtime.  5.  Janumet XR 1000 mg/50 mg oral tablet extended release 1 tablet orally once a day in the morning and at bedtime. 6.  Metoprolol 25 mg tablet 1 tablet orally 2 times a day.  7.  Sertraline 25 mg tablet 2 tabs orally once a day in the morning.  8.  Lisinopril 10 mg oral tablet 1/2 tab orally once a day in the morning.  9.  Tylenol 500 mg tablet 1 tablet orally  every 4 hours needed for pain or temp greater than 100.4.  10.  Oxycodone 5 mg tablet 1 to 2 tabs every 4 hours as needed for pain.  11.  Magnesium hydroxide 8% oral suspension 30 mL orally 2 times a day as needed for constipation. 12.  Mylanta 30 mL orally every 6 hours as needed for indigestion or heartburn. 13.  Xarelto 10 mg oral tablet 1 tablet orally once a day in the morning x 9 days. 14.  Bisacodyl 10 mg rectal suppository 1 suppository rectally once a day as needed.  15.  Docusate senna 50/8.6 oral tablet 1 tablet orally 2 times a day.   ____________________________ T. Rachelle Hora, PA-C tcg:sb D: 08/18/2012 08:51:00 ET T: 08/18/2012 09:18:14 ET JOB#: [id number]  cc: T. Rachelle Hora, PA-C, <Dictator> Duanne Guess [place] ELECTRONICALLY SIGNED 08/24/2012 7:41

## 2014-06-08 NOTE — Op Note (Signed)
PATIENT NAME:  William Walton, William Walton MR#:  [id number] DATE OF BIRTH:  [birth year]  DATE OF PROCEDURE:  08/16/2012  PREOPERATIVE DIAGNOSIS:  Severe left knee osteoarthritis.   POSTOPERATIVE DIAGNOSIS:  Severe left knee osteoarthritis.   PROCEDURE:  Left total knee replacement.   SURGEON:  Laurene Footman, M.D.   ASSISTATN:  Rachelle Hora, PA-C.   ANESTHESIA:  Spinal.   DESCRIPTION OF PROCEDURE:  The patient was brought to the Operating Room and after adequate anesthesia was obtained, the left leg was prepped and draped in the usual sterile fashion with a tourniquet applied to the upper thigh.  After appropriate patient identification, timeout procedures were completed.  The leg was exsanguinated with an Esmarch and the tourniquet raised to 300 mmHg.  A midline skin incision was made, followed by a medial parapatellar arthrotomy.  Inspection revealed severe tricompartmental degenerative changes, extensive osteophytes, loss of articular cartilage and bony spurs.  The ACL and fat pad were excised and the proximal tibia exposed to allow for application of the Medacta My Knee cutting block.  After application of the cutting guide and checking cuts, a proximal tibia cut was carried out with the bone removed.  Next, the femur was approached in a similar fashion, applying the femoral cutting guide and distal femoral cuts made.  Through the pinholes from the cutting guide a size 4 cutting block was applied for anterior, posterior and chamfer cuts.  Following this, the residual horns of the menisci were excised.  The size 4 tibial baseplate was placed in the appropriate rotation, pinned in place, proximal drill hole made, followed by notch cut.  A trial insert was placed and then the size 4 femoral trial placed.  The knee had instability in flexion and extension at this point and the 12 mm component gave much better stability.  The PCL was released prior to this because of severe tightness in flexion.  With the 12 mm  implant showing excellent stability in flexion, mid flexion and extension, pin was placed proximally and the notch cut for the trochlea and distal femoral drill holes were made.  Patella was cut using the guide and 3 pin drill holes made.  The patella sized to a size 3.  At this point, all trial components were removed and the tourniquet was let down.  The knee was infiltrated with Exparel as well as a combination of Marcaine, Toradol and morphine and the tourniquet was raised again after obtaining hemostasis.  After thorough irrigation of the knee and drying the bony surfaces, the tibial component was cemented into place first with the excess cement removed.  Tibial insert was then placed, followed by the femoral component and the knee held in extension as the cement set.  The patellar button was clamped into place as well.  All components cemented.  After the cement had set, excess cement was removed.  We used a small osteotome and the knee thoroughly irrigated.  Arthrotomy was repaired using a heavy quill suture, 2-0 quill subcutaneously and skin staples.  Xeroform, 4 x 4's, ABD, Webril, Polar Care and Ace wrap were applied along with a knee immobilizer.  The patient was sent to the recovery room in stable condition.   ESTIMATED BLOOD LOSS:  100 mL.   TOTAL TOURNIQUET TIME:  87 minutes at 300 mmHg.   SPECIMENS:  Cut ends of bone.    ____________________________ Laurene Footman, MD mjm:ea D: 08/16/2012 22:20:19 ET T: 08/17/2012 03:47:47 ET JOB#: [id number]  cc: Christian Mate.  Rudene Christians, MD, <Dictator> Christian Mate [facility] MD ELECTRONICALLY SIGNED 08/17/2012 7:38

## 2014-06-21 ENCOUNTER — Encounter: Payer: Self-pay | Admitting: Internal Medicine

## 2014-06-21 ENCOUNTER — Ambulatory Visit (INDEPENDENT_AMBULATORY_CARE_PROVIDER_SITE_OTHER): Payer: Medicare Other | Admitting: Internal Medicine

## 2014-06-21 VITALS — BP 114/68 | HR 85 | Temp 98.3°F | Resp 12 | Wt 189.0 lb

## 2014-06-21 DIAGNOSIS — E1159 Type 2 diabetes mellitus with other circulatory complications: Secondary | ICD-10-CM

## 2014-06-21 LAB — HEMOGLOBIN A1C: Hgb A1c MFr Bld: 7.1 % — ABNORMAL HIGH (ref 4.6–6.5)

## 2014-06-21 NOTE — Patient Instructions (Signed)
-   Please continue Janumet XR 1000-50 mg 2 tablets in am - Continue Glipizide ER 5 mg in am and 10 mg in pm - Continue Jardiance 25 mg in am  Please return in 3 months with your sugar log.   Please stop at the lab.

## 2014-06-21 NOTE — Progress Notes (Signed)
Patient ID: William Walton, male   DOB: [birth year], 74 y.o.   MRN: [id number]  HPI: William Walton is a 74 y.o.-year-old male, returning for f/u for DM2, dx 2010, non-insulin-dependent, uncontrolled, with complications (CAD - s/p CABG, peripheral neuropathy, ED), likely 2/2 Agent Orange. Also sees [name] at the [facility] ([place]). Last visit with me was 3 mo ago.  HbA1C levels: Lab Results  Component Value Date   HGBA1C 7.5* 03/23/2014   HGBA1C 7.6* 12/01/2013   HGBA1C 7.1* 08/31/2013   02/20/2011: A1c 7.1% 05/22/2011: A1c 6.8%, after which he was taken off the insulin >> A1C started to increase.  11/19/2011: A1c 8.5%, TSH 2.18, BUN/creatinine 16/1.0, ACR 3.7 02/24/2012: A1c 8.4%, vitamin B12 321 06/01/2012: 7.6% 01/26/2013: 7.3% - received records from [place]   Pt is on a regimen of: - Janumet XR 1000-50 mg x 2 po - Glipizide ER 5 mg in am and 10 mg in pm - Invokana 300 mg >> tolerates it well >> had to switch to [employer] 25 b/c insurance coverage 2 weeks ago  Pt checks his sugars 2x a day and they are same as before: - am: 104-140s (before last mo, he also had 180s-190) >> 108-140, 160 >> 102-150s, some 180s >> 77, 88, 98-155 - 2h after b'fast: 113 >> n/c - before lunch: 95-134 >> 140 >> 80, 139, 149 >> n/c >> 102 >> n/c - 2h after lunch: 80 >> n/c >> 78 >> n/c - before dinner: 70, 110, 160 >> 86-147 >> n/c >> 69, 91, 222 >> 120 >> n/c - usually 1h after dinner 120-220 >> n/c  - 2h after dinner:  111-163 >> 97-180 >> 75-200 (250x1) >> 113-152, 174 >> 92-134-185, 213 >> 100-148, 180, 198 - bedtime: 125-192 >> 126-165 >> n/c >> 94-198 (263) >> 118-174 >> 115-159 - at 11 pm: 90 >> 90, 118, 181 >> 167 >> n/c No lows. Lowest sugar was 77; he has hypoglycemia awareness at 70. Highest sugar was 213 >> 198x1.  He exercises by going to the gym 3x a week.  - has mild CKD. Last BUN/creatinine:  Lab Results  Component Value Date   BUN 19 05/30/2013   BUN 13 08/17/2012    CREATININE 1.1 05/30/2013   CREATININE 1.09 08/17/2012  19/1.2 (GFR 63.4) in 01/26/2013  16/1.0 in 05/2012.  Last ACR 12 in 01/26/2013. He is on Lisinopril 2.5.   - has HL. Last set of lipids:   212/249/29/106 (01/26/2013)  200/214/26.8/130.4 previously He switched from Lipitor 40 to Crestor 20 mg daily after the previous results. He continues Crestor.  He is on ASA 81.  - last eye exam was 07/13/2013 (has them yearly) - [name]. No DR. - + numbness and tingling in his feet. He is on Neurontin 300 mg, now 3x a day, and Metanx.  He also has a history of PTSD ([employer] veteran), HTN, HL, h/o L TKR.  I reviewed pt's medications, allergies, PMH, social hx, family hx, and changes were documented in the history of present illness. Otherwise, unchanged from my initial visit note.  ROS: Constitutional: no weight gain/loss, + fatigue, no subjective hyperthermia/hypothermia, + poor sleep Eyes: no blurry vision, no xerophthalmia ENT: no sore throat, no nodules palpated in throat, no dysphagia/odynophagia, no hoarseness Cardiovascular: no CP/SOB/palpitations/leg swelling Respiratory: no cough/SOB Gastrointestinal: no N/V/D/C Musculoskeletal: no muscle/+ joint aches (shoulders and neck) Skin: no rash Neurological: no tremors/numbness/tingling/dizziness  PE: BP 114/68 mmHg  Pulse 85  Temp(Src)  98.3 F (36.8 C) (Oral)  Resp 12  Wt 189 lb (85.73 kg)  SpO2 96% Body mass index is 28.74 kg/(m^2).  Wt Readings from Last 3 Encounters:  06/21/14 189 lb (85.73 kg)  05/18/14 197 lb 8 oz (89.585 kg)  05/10/14 196 lb (88.905 kg)   Constitutional: overweight, in NAD Eyes: PERRLA, EOMI, no exophthalmos ENT: moist mucous membranes, no thyromegaly, no cervical lymphadenopathy Cardiovascular: RRR, No MRG Respiratory: CTA B Gastrointestinal: abdomen soft, NT, ND, BS+ Musculoskeletal: no deformities, strength intact in all 4 Skin: moist, warm, no rashes  ASSESSMENT: 1. DM2,  non-insulin-dependent, uncontrolled, with complications - CAD, s/p AMI and 5v CABG at [facility] 08/29/2010 - Peripheral neuropathy - ED  2. PN  PLAN:  1. Patient with long-standing, fairly well controlled diabetes. Sugars ~same as before, despite having to switch from [place] to [place]. - we reviewed his sugar log together >> no major fluctuations. We again discussed about a good HbA1c target for him of ~7.5% due to age. - I suggested to continue current regimen:  Patient Instructions  - Please continue Janumet XR 1000-50 mg 2 tablets in am - Continue Glipizide ER 5 mg in am and 10 mg in pm - Continue Jardiance 25 mg in am  Please return in 3 months with your sugar log.   Please stop at the lab.  - continue check 2 times a day, rotating checks - will check HbA1c today - up to date with eye exams - Return to clinic in 3 months with sugar log   2. Peripheral neuropathy - on Neurontin to 300 mg tid - On Metanx generic - continue above, no recent exacerbations  CC: Dr. Ronnie Doss, [place] number [phone]  Office Visit on 06/21/2014  Component Date Value Ref Range Status  . Hgb A1c MFr Bld 06/21/2014 7.1* 4.6 - 6.5 % Final   Glycemic Control Guidelines for People with Diabetes:Non Diabetic:  <6%Goal of Therapy: <7%Additional Action Suggested:  >8%    hemoglobin A1c is better!

## 2014-07-06 DIAGNOSIS — T07 Unspecified multiple injuries: Secondary | ICD-10-CM | POA: Diagnosis not present

## 2014-07-18 DIAGNOSIS — H26491 Other secondary cataract, right eye: Secondary | ICD-10-CM | POA: Diagnosis not present

## 2014-07-18 DIAGNOSIS — H40019 Open angle with borderline findings, low risk, unspecified eye: Secondary | ICD-10-CM | POA: Diagnosis not present

## 2014-07-18 DIAGNOSIS — E119 Type 2 diabetes mellitus without complications: Secondary | ICD-10-CM | POA: Diagnosis not present

## 2014-07-18 DIAGNOSIS — D3131 Benign neoplasm of right choroid: Secondary | ICD-10-CM | POA: Diagnosis not present

## 2014-07-27 ENCOUNTER — Other Ambulatory Visit: Payer: Self-pay | Admitting: Internal Medicine

## 2014-08-01 DIAGNOSIS — I2510 Atherosclerotic heart disease of native coronary artery without angina pectoris: Secondary | ICD-10-CM | POA: Diagnosis not present

## 2014-08-01 DIAGNOSIS — E119 Type 2 diabetes mellitus without complications: Secondary | ICD-10-CM | POA: Diagnosis not present

## 2014-08-01 DIAGNOSIS — E784 Other hyperlipidemia: Secondary | ICD-10-CM | POA: Diagnosis not present

## 2014-08-01 DIAGNOSIS — I209 Angina pectoris, unspecified: Secondary | ICD-10-CM | POA: Diagnosis not present

## 2014-08-02 ENCOUNTER — Other Ambulatory Visit: Payer: Self-pay | Admitting: Internal Medicine

## 2014-08-03 ENCOUNTER — Other Ambulatory Visit: Payer: Self-pay | Admitting: Internal Medicine

## 2014-08-14 ENCOUNTER — Other Ambulatory Visit: Payer: Self-pay | Admitting: *Deleted

## 2014-08-14 ENCOUNTER — Encounter: Payer: Self-pay | Admitting: Internal Medicine

## 2014-08-14 MED ORDER — CANAGLIFLOZIN 300 MG PO TABS: 300.0000 mg | ORAL_TABLET | Freq: Every morning | ORAL | Status: DC

## 2014-08-14 NOTE — Telephone Encounter (Signed)
Per pt's MyChart message:    Hello Dr. Cruzita Lederer. [facility] required me to stop Invokana (300MG ) and start Jardiance 25mg  and it is not working my sugar level has gone up, over the last month my sugar level on many days has been over 200. Please put me back on Invokana (330mg ) and I will pay for it. Send the prescription to [facility]. Thanks Kainen   Sent Invokana 300 mg once daily to [facility]

## 2014-09-04 ENCOUNTER — Other Ambulatory Visit: Payer: Self-pay | Admitting: Internal Medicine

## 2014-09-21 ENCOUNTER — Ambulatory Visit (INDEPENDENT_AMBULATORY_CARE_PROVIDER_SITE_OTHER): Payer: Medicare Other | Admitting: Internal Medicine

## 2014-09-21 ENCOUNTER — Encounter: Payer: Self-pay | Admitting: Internal Medicine

## 2014-09-21 ENCOUNTER — Other Ambulatory Visit (INDEPENDENT_AMBULATORY_CARE_PROVIDER_SITE_OTHER): Payer: Medicare Other | Admitting: *Deleted

## 2014-09-21 VITALS — BP 114/62 | HR 94 | Temp 97.8°F | Resp 12 | Wt 192.0 lb

## 2014-09-21 DIAGNOSIS — E1159 Type 2 diabetes mellitus with other circulatory complications: Secondary | ICD-10-CM

## 2014-09-21 DIAGNOSIS — G629 Polyneuropathy, unspecified: Secondary | ICD-10-CM

## 2014-09-21 LAB — POCT GLYCOSYLATED HEMOGLOBIN (HGB A1C): Hemoglobin A1C: 7

## 2014-09-21 MED ORDER — CANAGLIFLOZIN 300 MG PO TABS: 300.0000 mg | ORAL_TABLET | Freq: Every morning | ORAL | Status: DC

## 2014-09-21 NOTE — Progress Notes (Signed)
Patient ID: William Walton, male   DOB: May 21, 1940, 74 y.o.   MRN: [id number]  HPI: William Walton is a 74 y.o.-year-old male, returning for f/u for DM2, dx 2010, non-insulin-dependent, uncontrolled, with complications (CAD - s/p CABG, peripheral neuropathy, ED), likely 2/2 Agent Orange. Also sees Dr William Walton at the [facility] ([place]). Last visit with me was 3 mo ago.  HbA1C levels: Lab Results  Component Value Date   HGBA1C 7.1* 06/21/2014   HGBA1C 7.5* 03/23/2014   HGBA1C 7.6* 12/01/2013   02/20/2011: A1c 7.1% 05/22/2011: A1c 6.8%, after which he was taken off the insulin >> A1C started to increase.  11/19/2011: A1c 8.5%, TSH 2.18, BUN/creatinine 16/1.0, ACR 3.7 02/24/2012: A1c 8.4%, vitamin B12 321 06/01/2012: 7.6% 01/26/2013: 7.3% - received records from [place]   Pt is on a regimen of: - Janumet XR 1000-50 mg x 2 po - Glipizide ER 5 mg in am and 10 mg in pm - Invokana 300 mg >> tolerated well >> had to switch to [employer] 25 b/c insurance coverage 2 weeks ago >> got back on Invokana as sugars increased to 200s (pays for it out of pocket, but will need to pay 400$).  Pt checks his sugars 2x a day and they are much worse on Jardiance 25 mg c/w Invokana 300 mg: - am: 108-140, 160 >> 102-150s, some 180s >> 77, 88, 98-155 >> (J: 171-190; I: 90-155) - 2h after b'fast: 113 >> n/c - before lunch: 95-134 >> 140 >> 80, 139, 149 >> n/c >> 102 >> n/c - 2h after lunch: 80 >> n/c >> 78 >> n/c - before dinner: 70, 110, 160 >> 86-147 >> n/c >> 69, 91, 222 >> 120 >> n/c >> (J: 176-191, 226; I: 50x1, 98-180) - usually 1h after dinner 120-220 >> n/c  - 2h after dinner: 75-200 (250x1) >> 113-152, 174 >> 92-134-185, 213 >> 100-148, 180, 198 >> 113-189 - bedtime: 125-192 >> 126-165 >> n/c >> 94-198 (263) >> 118-174 >> 115-159 >> 151-185, 220 - at 11 pm: 90 >> 90, 118, 181 >> 167 >> n/c No lows. Lowest sugar was 77 >> 90s; he has hypoglycemia awareness at 70. Highest sugar was 213 >> 200s.    He exercises by going to the gym 3x a week.  - has mild CKD. Last BUN/creatinine:  Lab Results  Component Value Date   BUN 19 05/30/2013   BUN 13 08/17/2012   CREATININE 1.1 05/30/2013   CREATININE 1.09 08/17/2012  19/1.2 (GFR 63.4) in 01/26/2013  16/1.0 in 05/2012.  He is on Lisinopril 2.5.   - has HL. Last set of lipids:  212/249/29/106 (01/26/2013)  200/214/26.8/130.4  He switched from Lipitor 40 to Crestor 20 mg daily after the previous results. He continues Crestor.  He is on ASA 81.  - last eye exam was 06/2014 (has them yearly) - Dr William Walton. No DR. - + numbness and tingling in his feet. He is on Neurontin 300 mg, now 3x a day, and Metanx.  He also has a history of PTSD ([employer] veteran), HTN, HL, h/o L TKR.  I reviewed pt's medications, allergies, PMH, social hx, family hx, and changes were documented in the history of present illness. Otherwise, unchanged from my initial visit note.  ROS: Constitutional: no weight gain/loss, no fatigue, no subjective hyperthermia/hypothermia, no  poor sleep Eyes: no blurry vision, no xerophthalmia ENT: no sore throat, no nodules palpated in throat, no dysphagia/odynophagia, no hoarseness Cardiovascular: no CP/SOB/palpitations/leg swelling Respiratory:  no cough/SOB Gastrointestinal: no N/V/D/C Musculoskeletal: no muscle/+ joint aches (shoulders and neck) Skin: no rash Neurological: no tremors/numbness/tingling/dizziness  PE: BP 114/62 mmHg  Pulse 94  Temp(Src) 97.8 F (36.6 C) (Oral)  Resp 12  Wt 192 lb (87.091 kg)  SpO2 96% Body mass index is 29.2 kg/(m^2).  Wt Readings from Last 3 Encounters:  09/21/14 192 lb (87.091 kg)  06/21/14 189 lb (85.73 kg)  05/18/14 197 lb 8 oz (89.585 kg)   Constitutional: overweight, in NAD Eyes: PERRLA, EOMI, no exophthalmos ENT: moist mucous membranes, no thyromegaly, no cervical lymphadenopathy Cardiovascular: RRR, No MRG Respiratory: CTA B Gastrointestinal: abdomen soft, NT, ND,  BS+ Musculoskeletal: no deformities, strength intact in all 4 Skin: moist, warm, no rashes  ASSESSMENT: 1. DM2, non-insulin-dependent, uncontrolled, with complications - CAD, s/p AMI and 5v CABG at [facility] 08/29/2010 - Peripheral neuropathy - ED  2. PN  PLAN:  1. Patient with long-standing, fairly well controlled diabetes, except when he had to switch from [place] to [place]. - we reviewed his sugar log together >> much improved after going back on Invokana but he is not sure how to continue to get it. He will d/w [place]. I made copied of his sugar log with Invokana and with Jardiance and we can maybe use them to convince his insurance to cover Invokana as the difference is very evident.  - We again discussed about a good HbA1c target for him of ~7.5% due to age. - I suggested to continue current regimen:  Patient Instructions  - Please continue Janumet XR 1000-50 mg 2 tablets in am - Continue Glipizide ER 5 mg in am and 10 mg in pm - Continue Invokana 300 mg in am   Please return in 3 months with your sugar log.   - continue check 2 times a day, rotating checks - will check HbA1c today >> 7.0% (improved!) - up to date with eye exams - Return to clinic in 3 months with sugar log   2. Peripheral neuropathy - on Neurontin to 300 mg tid - On Metanx generic - continue above, no recent exacerbations  CC: Dr. Ronnie Walton, [place] number [phone]

## 2014-09-21 NOTE — Patient Instructions (Signed)
-   Please continue Janumet XR 1000-50 mg 2 tablets in am - Continue Glipizide ER 5 mg in am and 10 mg in pm - Continue Invokana 300 mg in am  Please return in 3 months with your sugar log.

## 2014-09-26 ENCOUNTER — Telehealth: Payer: Self-pay | Admitting: *Deleted

## 2014-09-26 NOTE — Telephone Encounter (Signed)
I got Mr Mcneel' Invokana approved through covermymeds. I faxed the approval ID to [place]. Be advised.

## 2014-10-10 ENCOUNTER — Other Ambulatory Visit: Payer: Self-pay | Admitting: Family Medicine

## 2014-10-27 ENCOUNTER — Other Ambulatory Visit: Payer: Self-pay | Admitting: Internal Medicine

## 2014-11-22 DIAGNOSIS — Z808 Family history of malignant neoplasm of other organs or systems: Secondary | ICD-10-CM | POA: Diagnosis not present

## 2014-11-22 DIAGNOSIS — L821 Other seborrheic keratosis: Secondary | ICD-10-CM | POA: Diagnosis not present

## 2014-11-22 DIAGNOSIS — L578 Other skin changes due to chronic exposure to nonionizing radiation: Secondary | ICD-10-CM | POA: Diagnosis not present

## 2014-11-22 DIAGNOSIS — L820 Inflamed seborrheic keratosis: Secondary | ICD-10-CM | POA: Diagnosis not present

## 2014-11-22 DIAGNOSIS — L570 Actinic keratosis: Secondary | ICD-10-CM | POA: Diagnosis not present

## 2014-12-21 ENCOUNTER — Ambulatory Visit: Payer: Medicare Other | Admitting: Internal Medicine

## 2015-01-07 DIAGNOSIS — L539 Erythematous condition, unspecified: Secondary | ICD-10-CM | POA: Diagnosis not present

## 2015-01-07 DIAGNOSIS — M79672 Pain in left foot: Secondary | ICD-10-CM | POA: Diagnosis not present

## 2015-01-22 DIAGNOSIS — M1990 Unspecified osteoarthritis, unspecified site: Secondary | ICD-10-CM | POA: Diagnosis not present

## 2015-01-22 DIAGNOSIS — E1140 Type 2 diabetes mellitus with diabetic neuropathy, unspecified: Secondary | ICD-10-CM | POA: Diagnosis not present

## 2015-01-22 DIAGNOSIS — M898X9 Other specified disorders of bone, unspecified site: Secondary | ICD-10-CM | POA: Diagnosis not present

## 2015-01-22 DIAGNOSIS — B351 Tinea unguium: Secondary | ICD-10-CM | POA: Diagnosis not present

## 2015-01-25 ENCOUNTER — Other Ambulatory Visit: Payer: Self-pay | Admitting: Internal Medicine

## 2015-02-06 ENCOUNTER — Ambulatory Visit (INDEPENDENT_AMBULATORY_CARE_PROVIDER_SITE_OTHER): Payer: Medicare Other | Admitting: Internal Medicine

## 2015-02-06 ENCOUNTER — Encounter: Payer: Self-pay | Admitting: Internal Medicine

## 2015-02-06 ENCOUNTER — Other Ambulatory Visit (INDEPENDENT_AMBULATORY_CARE_PROVIDER_SITE_OTHER): Payer: Medicare Other | Admitting: *Deleted

## 2015-02-06 VITALS — BP 114/62 | HR 76 | Temp 97.8°F | Resp 12 | Wt 193.0 lb

## 2015-02-06 DIAGNOSIS — E1159 Type 2 diabetes mellitus with other circulatory complications: Secondary | ICD-10-CM | POA: Diagnosis not present

## 2015-02-06 LAB — POCT GLYCOSYLATED HEMOGLOBIN (HGB A1C): HEMOGLOBIN A1C: 7

## 2015-02-06 NOTE — Progress Notes (Signed)
Patient ID: [name], male   DOB: 1940-03-20, 74 y.o.   MRN: [id number]  HPI: William Walton is a 74 y.o.-year-old male, returning for f/u for DM2, dx 2010, non-insulin-dependent, uncontrolled, with complications (CAD - s/p CABG, peripheral neuropathy, ED), likely 2/2 Agent Orange. Also sees [name] at the [facility] ([place]). Last visit with me was 3 mo ago.  HbA1C levels: Lab Results  Component Value Date   HGBA1C 7.0 09/21/2014   HGBA1C 7.1* 06/21/2014   HGBA1C 7.5* 03/23/2014   02/20/2011: A1c 7.1% 05/22/2011: A1c 6.8%, after which he was taken off the insulin >> A1C started to increase.  11/19/2011: A1c 8.5%, TSH 2.18, BUN/creatinine 16/1.0, ACR 3.7 02/24/2012: A1c 8.4%, vitamin B12 321 06/01/2012: 7.6% 01/26/2013: 7.3% - received records from [place]   Pt is on a regimen of: - Janumet XR 1000-50 mg x 2 po - Glipizide ER 5 mg in am and 10 mg in pm - Invokana 300 mg (tolerated well >> had to switch to [employer] 25 b/c insurance coverage, but now Invokana approved)  Pt checks his sugars 2x a day: - am: 108-140, 160 >> 102-150s, some 180s >> 77, 88, 98-155 >> (J: 171-190; I: 90-155) >> 93, 106-134, 155, 180 - 2h after b'fast: 113 >> n/c >> 136 - before lunch: 95-134 >> 140 >> 80, 139, 149 >> n/c >> 102 >> n/c - 2h after lunch: 80 >> n/c >> 78 >> n/c - before dinner: 86-147 >> n/c >> 69, 91, 222 >> 120 >> n/c >> (J: 176-191, 226; I: 50x1, 98-180) >> 106-141, 151 - usually 1h after dinner 120-220 >> n/c  - 2h after dinner: 75-200 (250x1) >> 113-152, 174 >> 92-134-185, 213 >> 100-148, 180, 198 >> 113-189 >> 104-181 - bedtime: 125-192 >> 126-165 >> n/c >> 94-198 (263) >> 118-174 >> 115-159 >> 151-185, 220 >> 148, 152 - at 11 pm: 90 >> 90, 118, 181 >> 167 >> n/c No lows. Lowest sugar was 77 >> 90s >> 93; he has hypoglycemia awareness at 70.  Highest sugar was 213 >> 200s >> 274 x1 day trip - forgot meds at home).   He exercises by going to the gym 3x a week.  -  has mild CKD. Last BUN/creatinine:  Lab Results  Component Value Date   BUN 19 05/30/2013   BUN 13 08/17/2012   CREATININE 1.1 05/30/2013   CREATININE 1.09 08/17/2012  19/1.2 (GFR 63.4) in 01/26/2013  16/1.0 in 05/2012.  He is on Lisinopril 2.5.   - has HL. Last set of lipids:  212/249/29/106 (01/26/2013)  200/214/26.8/130.4  He switched from Lipitor 40 to Crestor 20 mg daily after the previous results. He continues Crestor.  He is on ASA 81.  - last eye exam was 06/2014 (has them yearly) - [name]. No DR. - + numbness and tingling in his feet. He is on Neurontin 300 mg, now 3x a day, and Metanx.  He also has a history of PTSD ([employer] veteran), HTN, HL, h/o L TKR.  I reviewed pt's medications, allergies, PMH, social hx, family hx, and changes were documented in the history of present illness. Otherwise, unchanged from my initial visit note.  ROS: Constitutional: no weight gain/loss, no fatigue, no subjective hyperthermia/hypothermia, no  poor sleep, no nocturia Eyes: no blurry vision, no xerophthalmia ENT: no sore throat, no nodules palpated in throat, no dysphagia/odynophagia, no hoarseness Cardiovascular: no CP/SOB/palpitations/leg swelling Respiratory: no cough/SOB Gastrointestinal: no N/V/D/Cshoulders and neck) Musculoskeletal: no  muscle/+ joint aches (shoulders and neck) Skin: no rash Neurological: no tremors/numbness/tingling/dizziness  PE: BP 114/62 mmHg  Pulse 76  Temp(Src) 97.8 F (36.6 C) (Oral)  Resp 12  Wt 193 lb (87.544 kg)  SpO2 95% Body mass index is 29.35 kg/(m^2).  Wt Readings from Last 3 Encounters:  02/06/15 193 lb (87.544 kg)  09/21/14 192 lb (87.091 kg)  06/21/14 189 lb (85.73 kg)   Constitutional: overweight, in NAD Eyes: PERRLA, EOMI, no exophthalmos ENT: moist mucous membranes, no thyromegaly, no cervical lymphadenopathy Cardiovascular: RRR, No MRG Respiratory: CTA B Gastrointestinal: abdomen soft, NT, ND, BS+ Musculoskeletal: no  deformities, strength intact in all 4 Skin: moist, warm, no rashes  ASSESSMENT: 1. DM2, non-insulin-dependent, uncontrolled, with complications - CAD, s/p AMI and 5v CABG at [facility] 08/29/2010 - Peripheral neuropathy - ED  2. PN  PLAN:  1. Patient with long-standing, fairly well controlled diabetes, except when he had to switch from [place] to [place]. Now back on Invokana with good control. - We again discussed about a good HbA1c target for him of ~7.5% due to age.  - I suggested to continue current regimen:  Patient Instructions  - Please continue Janumet XR 1000-50 mg 2 tablets in am - Continue Glipizide ER 5 mg in am and 10 mg in pm - Continue Invokana 300 mg in am   Please return in 3 months with your sugar log.   - continue check 2 times a day, rotating checks - will check HbA1c today >> 7.0% (stable) - up to date with eye exams - had kidney and Lipid panel checked 6 mo ago >> will need records from [place] - Return to clinic in 3 months with sugar log   2. Peripheral neuropathy - on Neurontin to 300 mg tid - On Metanx generic - continue above, no recent exacerbations  CC: Dr. Ronnie Doss, [place] number [phone]

## 2015-02-06 NOTE — Patient Instructions (Signed)
-   Please continue Janumet XR 1000-50 mg 2 tablets in am - Continue Glipizide ER 5 mg in am and 10 mg in pm - Continue Invokana 300 mg in am  Please return in 3 months with your sugar log.  

## 2015-02-14 ENCOUNTER — Encounter: Payer: Self-pay | Admitting: *Deleted

## 2015-03-11 DIAGNOSIS — M1990 Unspecified osteoarthritis, unspecified site: Secondary | ICD-10-CM | POA: Diagnosis not present

## 2015-03-11 DIAGNOSIS — E669 Obesity, unspecified: Secondary | ICD-10-CM | POA: Diagnosis not present

## 2015-03-11 DIAGNOSIS — I209 Angina pectoris, unspecified: Secondary | ICD-10-CM | POA: Diagnosis not present

## 2015-03-11 DIAGNOSIS — I2510 Atherosclerotic heart disease of native coronary artery without angina pectoris: Secondary | ICD-10-CM | POA: Diagnosis not present

## 2015-03-11 DIAGNOSIS — E119 Type 2 diabetes mellitus without complications: Secondary | ICD-10-CM | POA: Diagnosis not present

## 2015-03-11 DIAGNOSIS — I25719 Atherosclerosis of autologous vein coronary artery bypass graft(s) with unspecified angina pectoris: Secondary | ICD-10-CM | POA: Diagnosis not present

## 2015-03-11 DIAGNOSIS — I10 Essential (primary) hypertension: Secondary | ICD-10-CM | POA: Diagnosis not present

## 2015-03-11 DIAGNOSIS — E784 Other hyperlipidemia: Secondary | ICD-10-CM | POA: Diagnosis not present

## 2015-03-24 ENCOUNTER — Other Ambulatory Visit: Payer: Self-pay | Admitting: Internal Medicine

## 2015-04-23 ENCOUNTER — Other Ambulatory Visit: Payer: Self-pay | Admitting: Internal Medicine

## 2015-04-24 DIAGNOSIS — E1140 Type 2 diabetes mellitus with diabetic neuropathy, unspecified: Secondary | ICD-10-CM | POA: Diagnosis not present

## 2015-04-24 DIAGNOSIS — B351 Tinea unguium: Secondary | ICD-10-CM | POA: Diagnosis not present

## 2015-05-01 ENCOUNTER — Telehealth: Payer: Self-pay | Admitting: Family Medicine

## 2015-05-01 NOTE — Telephone Encounter (Signed)
LM for pt to schedule AWV/mn

## 2015-05-08 ENCOUNTER — Ambulatory Visit (INDEPENDENT_AMBULATORY_CARE_PROVIDER_SITE_OTHER): Payer: Medicare Other | Admitting: Internal Medicine

## 2015-05-08 ENCOUNTER — Other Ambulatory Visit (INDEPENDENT_AMBULATORY_CARE_PROVIDER_SITE_OTHER): Payer: Medicare Other | Admitting: *Deleted

## 2015-05-08 ENCOUNTER — Encounter: Payer: Self-pay | Admitting: Internal Medicine

## 2015-05-08 VITALS — BP 124/80 | HR 92 | Temp 97.7°F | Resp 14 | Wt 193.8 lb

## 2015-05-08 DIAGNOSIS — E1159 Type 2 diabetes mellitus with other circulatory complications: Secondary | ICD-10-CM | POA: Diagnosis not present

## 2015-05-08 DIAGNOSIS — G63 Polyneuropathy in diseases classified elsewhere: Secondary | ICD-10-CM | POA: Diagnosis not present

## 2015-05-08 LAB — LIPID PANEL
Cholesterol: 131 mg/dL (ref 0–200)
HDL: 28.9 mg/dL — AB (ref >39.00)
LDL CALC: 63 mg/dL (ref 0–99)
NonHDL: 101.77
TRIGLYCERIDES: 193 mg/dL — AB (ref 0.0–149.0)
Total CHOL/HDL Ratio: 5
VLDL: 38.6 mg/dL (ref 0.0–40.0)

## 2015-05-08 LAB — COMPLETE METABOLIC PANEL WITH GFR
ALT: 14 U/L (ref 9–46)
AST: 17 U/L (ref 10–35)
Albumin: 4.5 g/dL (ref 3.6–5.1)
Alkaline Phosphatase: 44 U/L (ref 40–115)
BUN: 15 mg/dL (ref 7–25)
CHLORIDE: 103 mmol/L (ref 98–110)
CO2: 23 mmol/L (ref 20–31)
CREATININE: 1.04 mg/dL (ref 0.70–1.18)
Calcium: 9.1 mg/dL (ref 8.6–10.3)
GFR, EST AFRICAN AMERICAN: 82 mL/min (ref >=60)
GFR, Est Non African American: 71 mL/min (ref >=60)
Glucose, Bld: 158 mg/dL — ABNORMAL HIGH (ref 65–99)
Potassium: 4.1 mmol/L (ref 3.5–5.3)
Sodium: 139 mmol/L (ref 135–146)
Total Bilirubin: 0.5 mg/dL (ref 0.2–1.2)
Total Protein: 7.1 g/dL (ref 6.1–8.1)

## 2015-05-08 LAB — POCT GLYCOSYLATED HEMOGLOBIN (HGB A1C): HEMOGLOBIN A1C: 7.5

## 2015-05-08 LAB — MICROALBUMIN / CREATININE URINE RATIO
CREATININE, U: 85.5 mg/dL
MICROALB/CREAT RATIO: 0.9 mg/g (ref 0.0–30.0)
Microalb, Ur: 0.8 mg/dL (ref 0.0–1.9)

## 2015-05-08 NOTE — Patient Instructions (Signed)
Please continue: - Janumet XR 1000-50 mg 2 tablets in am - Glipizide ER 5 mg in am and 10 mg in pm - Invokana 300 mg in am   Please start working on your diet.  Please return in 3 months with your sugar log.   Please stop at the lab.

## 2015-05-08 NOTE — Progress Notes (Signed)
Patient ID: William Walton, male   DOB: 05-Sep-1940, 75 y.o.   MRN: [id number]  HPI: William Walton is a 75 y.o.-year-old male, returning for f/u for DM2, dx 2010, non-insulin-dependent, uncontrolled, with complications (CAD - s/p CABG, peripheral neuropathy, ED), likely 2/2 Agent Orange. Also sees [name] at the [facility] ([place]). Last visit with me was 3 mo ago.  HbA1C levels: Lab Results  Component Value Date   HGBA1C 7.0 02/06/2015   HGBA1C 7.0 09/21/2014   HGBA1C 7.1* 06/21/2014   02/20/2011: A1c 7.1% 05/22/2011: A1c 6.8%, after which he was taken off the insulin >> A1C started to increase.  11/19/2011: A1c 8.5%, TSH 2.18, BUN/creatinine 16/1.0, ACR 3.7 02/24/2012: A1c 8.4%, vitamin B12 321 06/01/2012: 7.6% 01/26/2013: 7.3% - received records from [place]   Pt is on a regimen of: - Janumet XR 1000-50 mg x 2 po - Glipizide ER 5 mg in am and 10 mg in pm - Invokana 300 mg (tolerated well )  Pt checks his sugars 2x a day >> reviewed detailed log >>higher lately (dietary indiscretions): - am: 102-150s, some 180s >> 77, 88, 98-155 >> (J: 171-190; I: 90-155) >> 93, 106-134, 155, 180 >> 107-154, 188 - 2h after b'fast: 113 >> n/c >> 136 - before lunch: 95-134 >> 140 >> 80, 139, 149 >> n/c >> 102 >> n/c - 2h after lunch: 80 >> n/c >> 78 >> n/c - before dinner: 69, 91, 222 >> 120 >> n/c >> (J: 176-191, 226; I: 50x1, 98-180) >> 106-141, 151 >> 119 - 2h after dinner: 113-152, 174 >> 92-134-185, 213 >> 100-148, 180, 198 >> 113-189 >> 104-181 >> 114-189, 206 - bedtime: 126-165 >> n/c >> 94-198 (263) >> 118-174 >> 115-159 >> 151-185, 220 >> 148, 152 >> 159 - at 11 pm: 90 >> 90, 118, 181 >> 167 >> n/c No lows. Lowest sugar was 77 >> 90s >> 93 >> 107; he has hypoglycemia awareness at 70.  Highest sugar was 213 >> 200s >> 274 x1 day trip - forgot meds at home) >> 206  He exercises by going to the gym 3x a week.  - has mild CKD. Last BUN/creatinine:  Lab Results  Component Value  Date   BUN 19 05/30/2013   BUN 13 08/17/2012   CREATININE 1.1 05/30/2013   CREATININE 1.09 08/17/2012  19/1.2 (GFR 63.4) in 01/26/2013  16/1.0 in 05/2012.  He is on Lisinopril 2.5.   - has HL. Last set of lipids:  212/249/29/106 (01/26/2013)  200/214/26.8/130.4  He switched from Lipitor 40 to Crestor 20 mg daily after the previous results. He continues Crestor.  He is on ASA 81.  - last eye exam was 06/2014 (has them yearly) - [name]. No DR. - + numbness and tingling in his feet. He is on Neurontin 300 mg, now 3x a day, and Metanx.  He also has a history of PTSD ([place] veteran), HTN, HL, h/o L TKR.  I reviewed pt's medications, allergies, PMH, social hx, family hx, and changes were documented in the history of present illness. Otherwise, unchanged from my initial visit note.  ROS: Constitutional: no weight gain/loss, no fatigue, no subjective hyperthermia/hypothermia, no  poor sleep, no nocturia Eyes: no blurry vision, no xerophthalmia ENT: no sore throat, no nodules palpated in throat, no dysphagia/odynophagia, no hoarseness Cardiovascular: no CP/SOB/palpitations/leg swelling Respiratory: no cough/SOB Gastrointestinal: no N/V/D/C Musculoskeletal: no muscle/joint aches Skin: no rash Neurological: no tremors/numbness/tingling/dizziness  PE: BP 124/80 mmHg  Pulse  92  Temp(Src) 97.7 F (36.5 C) (Oral)  Resp 14  Wt 193 lb 12.8 oz (87.907 kg)  SpO2 94% Body mass index is 29.47 kg/(m^2).  Wt Readings from Last 3 Encounters:  05/08/15 193 lb 12.8 oz (87.907 kg)  02/06/15 193 lb (87.544 kg)  09/21/14 192 lb (87.091 kg)   Constitutional: overweight, in NAD Eyes: PERRLA, EOMI, no exophthalmos ENT: moist mucous membranes, no thyromegaly, no cervical lymphadenopathy Cardiovascular: RRR, No MRG Respiratory: CTA B Gastrointestinal: abdomen soft, NT, ND, BS+ Musculoskeletal: no deformities, strength intact in all 4 Skin: moist, warm, no rashes  ASSESSMENT: 1. DM2,  non-insulin-dependent, uncontrolled, with complications - CAD, s/p AMI and 5v CABG at [facility] 08/29/2010 - Peripheral neuropathy - ED  2. PN  PLAN:  1. Patient with long-standing, fairly well controlled diabetes, but higher sugars c/w last visit due to snacking and increased sweets in his diet >> he is determined to start eliminating these. HbA1c today >> 7.5% (higher).  - a good HbA1c target for him of ~7.5% due to age.  - I suggested to continue current regimen:  Patient Instructions  Please continue: - Janumet XR 1000-50 mg 2 tablets in am - Glipizide ER 5 mg in am and 10 mg in pm - Invokana 300 mg in am   Please start working on your diet.  Please return in 3 months with your sugar log.   Please stop at the lab.  - continue check 2 times a day, rotating checks - up to date with eye exams - will check: Orders Placed This Encounter  Procedures  . COMPLETE METABOLIC PANEL WITH GFR  . Microalbumin / creatinine urine ratio  . Lipid panel  - Return to clinic in 3 months with sugar log   2. Peripheral neuropathy - on Neurontin 300 mg tid - On Metanx generic - continue above, no recent exacerbations  CC: Dr. Ronnie Doss, FAX number [phone]  Orders Only on 05/08/2015  Component Date Value Ref Range Status  . Hemoglobin A1C 05/08/2015 7.5   Final  Office Visit on 05/08/2015  Component Date Value Ref Range Status  . Sodium 05/08/2015 139  135 - 146 mmol/L Final  . Potassium 05/08/2015 4.1  3.5 - 5.3 mmol/L Final  . Chloride 05/08/2015 103  98 - 110 mmol/L Final  . CO2 05/08/2015 23  20 - 31 mmol/L Final  . Glucose, Bld 05/08/2015 158* 65 - 99 mg/dL Final  . BUN 05/08/2015 15  7 - 25 mg/dL Final  . Creat 05/08/2015 1.04  0.70 - 1.18 mg/dL Final  . Total Bilirubin 05/08/2015 0.5  0.2 - 1.2 mg/dL Final  . Alkaline Phosphatase 05/08/2015 44  40 - 115 U/L Final  . AST 05/08/2015 17  10 - 35 U/L Final  . ALT 05/08/2015 14  9 - 46 U/L Final  . Total Protein  05/08/2015 7.1  6.1 - 8.1 g/dL Final  . Albumin 05/08/2015 4.5  3.6 - 5.1 g/dL Final  . Calcium 05/08/2015 9.1  8.6 - 10.3 mg/dL Final  . GFR, Est African American 05/08/2015 82  >=60 mL/min Final  . GFR, Est Non African American 05/08/2015 71  >=60 mL/min Final   Comment:   The estimated GFR is a calculation valid for adults (>=75 years old) that uses the CKD-EPI algorithm to adjust for age and sex. It is   not to be used for children, pregnant women, hospitalized patients,    patients on dialysis, or with rapidly changing kidney  function. According to the NKDEP, eGFR >89 is normal, 60-89 shows mild impairment, 30-59 shows moderate impairment, 15-29 shows severe impairment and <15 is ESRD.     Jacquelyne Balint, Ur 05/08/2015 0.8  0.0 - 1.9 mg/dL Final  . Creatinine,U 05/08/2015 85.5   Final  . Microalb Creat Ratio 05/08/2015 0.9  0.0 - 30.0 mg/g Final  . Cholesterol 05/08/2015 131  0 - 200 mg/dL Final   ATP III Classification       Desirable:  < 200 mg/dL               Borderline High:  200 - 239 mg/dL          High:  > = 240 mg/dL  . Triglycerides 05/08/2015 193.0* 0.0 - 149.0 mg/dL Final   Normal:  <150 mg/dLBorderline High:  150 - 199 mg/dL  . HDL 05/08/2015 28.90* >39.00 mg/dL Final  . VLDL 05/08/2015 38.6  0.0 - 40.0 mg/dL Final  . LDL Cholesterol 05/08/2015 63  0 - 99 mg/dL Final  . Total CHOL/HDL Ratio 05/08/2015 5   Final                  Men          Women1/2 Average Risk     3.4          3.3Average Risk          5.0          4.42X Average Risk          9.6          7.13X Average Risk          15.0          11.0                      . NonHDL 05/08/2015 101.77   Final   NOTE:  Non-HDL goal should be 30 mg/dL higher than patient's LDL goal (i.e. LDL goal of < 70 mg/dL, would have non-HDL goal of < 100 mg/dL)   Msg sent: Dear Mr Kirker, Your kidney test is stable. Your liver tests are normal. Glucose is slightly higher. You do not have proteins in your urine, which is great. Your  cholesterol levels have improved. Sincerely, [name] MD

## 2015-05-29 DIAGNOSIS — L578 Other skin changes due to chronic exposure to nonionizing radiation: Secondary | ICD-10-CM | POA: Diagnosis not present

## 2015-05-29 DIAGNOSIS — L812 Freckles: Secondary | ICD-10-CM | POA: Diagnosis not present

## 2015-05-29 DIAGNOSIS — L570 Actinic keratosis: Secondary | ICD-10-CM | POA: Diagnosis not present

## 2015-05-29 DIAGNOSIS — L821 Other seborrheic keratosis: Secondary | ICD-10-CM | POA: Diagnosis not present

## 2015-06-14 ENCOUNTER — Ambulatory Visit (INDEPENDENT_AMBULATORY_CARE_PROVIDER_SITE_OTHER)
Admission: RE | Admit: 2015-06-14 | Discharge: 2015-06-14 | Disposition: A | Discharge status: Home / Self Care | Payer: Medicare Other | Source: Ambulatory Visit | Attending: Family Medicine | Admitting: Family Medicine

## 2015-06-14 ENCOUNTER — Encounter: Payer: Self-pay | Admitting: Family Medicine

## 2015-06-14 ENCOUNTER — Telehealth: Payer: Self-pay | Admitting: Family Medicine

## 2015-06-14 ENCOUNTER — Ambulatory Visit (INDEPENDENT_AMBULATORY_CARE_PROVIDER_SITE_OTHER): Payer: Medicare Other | Admitting: Family Medicine

## 2015-06-14 VITALS — BP 132/78 | HR 92 | Temp 97.8°F | Wt 192.5 lb

## 2015-06-14 DIAGNOSIS — S99921A Unspecified injury of right foot, initial encounter: Secondary | ICD-10-CM | POA: Diagnosis not present

## 2015-06-14 DIAGNOSIS — M7989 Other specified soft tissue disorders: Secondary | ICD-10-CM | POA: Diagnosis not present

## 2015-06-14 DIAGNOSIS — M170 Bilateral primary osteoarthritis of knee: Secondary | ICD-10-CM | POA: Diagnosis not present

## 2015-06-14 DIAGNOSIS — S8991XA Unspecified injury of right lower leg, initial encounter: Secondary | ICD-10-CM | POA: Diagnosis not present

## 2015-06-14 DIAGNOSIS — M79604 Pain in right leg: Secondary | ICD-10-CM | POA: Diagnosis not present

## 2015-06-14 MED ORDER — HYDROCODONE-ACETAMINOPHEN 5-325 MG PO TABS: 1.0000 | ORAL_TABLET | Freq: Three times a day (TID) | ORAL | Status: DC | PRN

## 2015-06-14 NOTE — Assessment & Plan Note (Addendum)
Twisting R leg injury 1 wk ago in known knee osteoarthritis s/p L replacement. Will start with imaging of L knee, tib/fib, foot (given 5th MT base tenderness to palpation and lower leg tenderness to palpation).  Xrays without obvious fracture on my evaluation, ?loose body at knee joint - will await radiology evaluation.  In interim, rec start using neoprene knee sleeve brace for support, elevation of knee, rest leg, and use tylenol + hydrocodone for breakthrough pain. Rx provided today. Avoid NSAIDs given CAD/CABG hx. Pt requests referral to new ortho in [place] if needed - will call me with names.

## 2015-06-14 NOTE — Progress Notes (Signed)
Pre visit review using our clinic review tool, if applicable. No additional management support is needed unless otherwise documented below in the visit note. 

## 2015-06-14 NOTE — Telephone Encounter (Signed)
Patient's wife,Carolyn,called.  She said patient would like to be referred to [place] or [place].

## 2015-06-14 NOTE — Patient Instructions (Addendum)
xrays of right leg today - overall ok on my read. Start using knee sleeve brace to right knee, start hydrocodone sparingly after tylenol as needed for breakthrough pain. Rest knee, elevate knee. Call me with name of ortho doc and we will refer you for further evaluation.  Good to see you today, call us with questions. Look into glucosamine supplement daily.

## 2015-06-14 NOTE — Progress Notes (Signed)
BP 132/78 mmHg  Pulse 92  Temp(Src) 97.8 F (36.6 C) (Oral)  Wt 192 lb 8 oz (87.317 kg)   CC: R ankle/knee pain  Subjective:    Patient ID: William Walton, male    DOB: 09/10/1940, 75 y.o.   MRN: [id number]  HPI: William Walton is a 75 y.o. male presenting on 06/14/2015 for Knee Pain and Ankle Pain   Suffered fall 1 wk ago. Transitioning to solar energy at home, dug trench in yard. Twisted R knee when he tripped forward while in trench. Felt pop of knee and pulling sensation from groin to ankle. Now with ongoing medial and lateral knee pain and medial ankle and midfoot pain, also with some buttock discomfort. Staying uncomfortable 5/10 pain. Transitions or prolonged standing >7min cause 8/10 pain. Feels knee instability but no locking.   Taking tylenol PRN for pain. No ice or heat to knee.   H/o L TKR ([facility] at [facility] 2014) s/p 12 wks PT, never recovered ROM to his satisfaction.  Known R knee osteoarthritis. Receiving PT for neck pain through [place].   Relevant past medical, surgical, family and social history reviewed and updated as indicated. Interim medical history since our last visit reviewed. Allergies and medications reviewed and updated. Current Outpatient Prescriptions on File Prior to Visit  Medication Sig  . acetaminophen (TYLENOL) 325 MG tablet Take 650 mg by mouth every 6 (six) hours as needed.    Marland Kitchen amLODipine (NORVASC) 5 MG tablet Take 5 mg by mouth daily.  Marland Kitchen amoxicillin (AMOXIL) 500 MG capsule Take 2,000 mg by mouth daily. Take one hour prior to dental treatment  . aspirin 81 MG tablet Take 81 mg by mouth daily.    Marland Kitchen gabapentin (NEURONTIN) 300 MG capsule TAKE 3 CAPSULES TWICE A DAY  . GLIPIZIDE XL 5 MG 24 hr tablet TAKE 1 TABLET IN THE MORNING AND 2 TABLETS IN THE EVENING  . glucose blood (FREESTYLE LITE) test strip USE TO TEST BLOOD SUGAR 2 TIMES DAILY AS INSTRUCTED. DX: E11.59  . guaiFENesin-codeine (ROBITUSSIN AC) 100-10 MG/5ML syrup Take 5 mLs by mouth 2 (two) times  daily as needed for cough (sedation precautions).  . INVOKANA 300 MG TABS tablet TAKE 1 TABLET EVERY MORNING  . JANUMET XR 50-1000 MG TB24 TAKE 1 TABLET TWICE A DAY  . JARDIANCE 25 MG TABS tablet TAKE 1 TABLET DAILY  . L-Methylfolate-B6-B12 (FOLTANX) 3-35-2 MG TABS TAKE 1 TABLET TWICE A DAY  . lisinopril (PRINIVIL,ZESTRIL) 2.5 MG tablet Take 5 mg by mouth daily.   . metoprolol tartrate (LOPRESSOR) 25 MG tablet Take 0.5 tablets (12.5 mg total) by mouth 2 (two) times daily.  . prazosin (MINIPRESS) 1 MG capsule Take 1 mg by mouth at bedtime.  . rosuvastatin (CRESTOR) 20 MG tablet Take 20 mg by mouth daily.  . sertraline (ZOLOFT) 25 MG tablet Take 25 mg by mouth daily.  . traMADol (ULTRAM) 50 MG tablet Take 1 tablet (50 mg total) by mouth 2 (two) times daily as needed.   No current facility-administered medications on file prior to visit.    Review of Systems Per HPI unless specifically indicated in ROS section     Objective:    BP 132/78 mmHg  Pulse 92  Temp(Src) 97.8 F (36.6 C) (Oral)  Wt 192 lb 8 oz (87.317 kg)  Wt Readings from Last 3 Encounters:  06/14/15 192 lb 8 oz (87.317 kg)  05/08/15 193 lb 12.8 oz (87.907 kg)  02/06/15 193 lb (87.544 kg)  Physical Exam  Constitutional: He appears well-developed and well-nourished. No distress.  Musculoskeletal: He exhibits no edema.  S/p L TKR R leg exam: Diffuse moderate pain to palpation throughout right leg No deformity on inspection. + pain with palpation medial/lateral knee joint line Tender to palpation at mid fibula/tibia Mild swelling of knee joint Limited ROM in flex/extension 2/2 pain No popliteal fullness. Neg drawer test. Modestly positive mcmurray test. No pain with valgus/varus stress. No PFgrind. No abnormal patellar mobility.  2+ DP bilaterally No pain at L malleoli, no ankle ligament laxity Tender to palpation at base of 5th MT  Neurological:  Antalgic gait with first few steps upon standing   Nursing  note and vitals reviewed.  Results for orders placed or performed in visit on 05/08/15  POCT HgB A1C  Result Value Ref Range   Hemoglobin A1C 7.5    Lab Results  Component Value Date   CREATININE 1.04 05/08/2015       Assessment & Plan:   Problem List Items Addressed This Visit    Primary osteoarthritis of both knees   Relevant Orders   Ambulatory referral to Orthopedic Surgery   Right leg injury - Primary    Twisting R leg injury 1 wk ago in known knee osteoarthritis s/p L replacement. Will start with imaging of L knee, tib/fib, foot (given 5th MT base tenderness to palpation and lower leg tenderness to palpation).  Xrays without obvious fracture on my evaluation, ?loose body at knee joint - will await radiology evaluation.  In interim, rec start using neoprene knee sleeve brace for support, elevation of knee, rest leg, and use tylenol + hydrocodone for breakthrough pain. Rx provided today. Avoid NSAIDs given CAD/CABG hx. Pt requests referral to new ortho in [place] if needed - will call me with names.      Relevant Orders   DG Tibia/Fibula Right (Completed)   DG Foot Complete Right (Completed)   DG Knee AP/LAT W/Sunrise Right (Completed)   Ambulatory referral to Orthopedic Surgery       Follow up plan: Return for follow up visit.  Ria Bush, MD

## 2015-06-15 NOTE — Telephone Encounter (Signed)
Referral placed.

## 2015-06-21 ENCOUNTER — Telehealth: Payer: Self-pay

## 2015-06-21 ENCOUNTER — Other Ambulatory Visit: Payer: Self-pay | Admitting: Internal Medicine

## 2015-06-21 MED ORDER — OXYCODONE-ACETAMINOPHEN 5-325 MG PO TABS: 1.0000 | ORAL_TABLET | Freq: Three times a day (TID) | ORAL | Status: DC | PRN

## 2015-06-21 NOTE — Telephone Encounter (Signed)
Rx given to Lynn to give to patient. 

## 2015-06-21 NOTE — Telephone Encounter (Signed)
Spoke to pt who states his appt is 05/09 and his pain has worsened. Advised pt to try 2 hydrocodone and contact office back with update

## 2015-06-21 NOTE — Telephone Encounter (Signed)
William Walton left v/m;(do not see DPR for [facility]) pt was seen on 06/14/15 for leg pain; hydrocodone apap 5-325 is not helping pain. Pt request stronger medication for pain; pt did not rest at all last night.Medicap.

## 2015-06-21 NOTE — Telephone Encounter (Signed)
When is ortho appt? Has leg pain worsened? Would have try 2 hydrocodone at a time to see if any better rest.  If not, Rx for oxycodone printed and in Kim's box.

## 2015-06-21 NOTE — Telephone Encounter (Signed)
Spoke with patient - he states he already tried 2 at once, didn't help. Advised we could try oxycodone stronger pain med - printed and given to my CMA.

## 2015-06-25 DIAGNOSIS — S7001XA Contusion of right hip, initial encounter: Secondary | ICD-10-CM | POA: Diagnosis not present

## 2015-06-25 DIAGNOSIS — S8001XA Contusion of right knee, initial encounter: Secondary | ICD-10-CM | POA: Diagnosis not present

## 2015-07-04 ENCOUNTER — Ambulatory Visit: Payer: Medicare Other | Admitting: Internal Medicine

## 2015-07-05 ENCOUNTER — Ambulatory Visit (INDEPENDENT_AMBULATORY_CARE_PROVIDER_SITE_OTHER): Payer: Medicare Other | Admitting: Family Medicine

## 2015-07-05 ENCOUNTER — Other Ambulatory Visit: Payer: Self-pay | Admitting: Family Medicine

## 2015-07-05 ENCOUNTER — Encounter: Payer: Self-pay | Admitting: Family Medicine

## 2015-07-05 VITALS — BP 126/70 | HR 93 | Temp 97.9°F | Wt 193.2 lb

## 2015-07-05 DIAGNOSIS — S8991XD Unspecified injury of right lower leg, subsequent encounter: Secondary | ICD-10-CM

## 2015-07-05 DIAGNOSIS — S30861A Insect bite (nonvenomous) of abdominal wall, initial encounter: Secondary | ICD-10-CM | POA: Insufficient documentation

## 2015-07-05 DIAGNOSIS — W57XXXA Bitten or stung by nonvenomous insect and other nonvenomous arthropods, initial encounter: Secondary | ICD-10-CM | POA: Diagnosis not present

## 2015-07-05 MED ORDER — DOXYCYCLINE HYCLATE 100 MG PO CAPS: 100.0000 mg | ORAL_CAPSULE | Freq: Two times a day (BID) | ORAL | Status: DC

## 2015-07-05 NOTE — Progress Notes (Signed)
BP 126/70 mmHg  Pulse 93  Temp(Src) 97.9 F (36.6 C) (Oral)  Wt 193 lb 4 oz (87.658 kg)  SpO2 97%   CC: check skin on abdomen  Subjective:    Patient ID: William Walton, male    DOB: 1940/07/08, 75 y.o.   MRN: [id number]  HPI: William Walton is a 75 y.o. male presenting on 07/05/2015 for Mass   4d ago in bed felt itchy spot R abdomen. Scratched, felt something fall off. Over next few days noticed some redness spreading with streaking as well. He has found other ticks crawling on him  No fevers, new joint pains, headache, abd pain or nausea. Some night time chlils.  R leg injury - seen by [place] ortho - likely will check MRI.   Relevant past medical, surgical, family and social history reviewed and updated as indicated. Interim medical history since our last visit reviewed. Allergies and medications reviewed and updated. Current Outpatient Prescriptions on File Prior to Visit  Medication Sig  . acetaminophen (TYLENOL) 325 MG tablet Take 650 mg by mouth every 6 (six) hours as needed.    Marland Kitchen amLODipine (NORVASC) 5 MG tablet Take 5 mg by mouth daily.  Marland Kitchen amoxicillin (AMOXIL) 500 MG capsule Take 2,000 mg by mouth daily. Take one hour prior to dental treatment  . aspirin 81 MG tablet Take 81 mg by mouth daily.    Marland Kitchen GLIPIZIDE XL 5 MG 24 hr tablet TAKE 1 TABLET IN THE MORNING AND 2 TABLETS IN THE EVENING  . glucose blood (FREESTYLE LITE) test strip USE TO TEST BLOOD SUGAR 2 TIMES DAILY AS INSTRUCTED. DX: E11.59  . guaiFENesin-codeine (ROBITUSSIN AC) 100-10 MG/5ML syrup Take 5 mLs by mouth 2 (two) times daily as needed for cough (sedation precautions).  Marland Kitchen HYDROcodone-acetaminophen (NORCO/VICODIN) 5-325 MG tablet Take 1 tablet by mouth 3 (three) times daily as needed for moderate pain.  . INVOKANA 300 MG TABS tablet TAKE 1 TABLET EVERY MORNING  . JANUMET XR 50-1000 MG TB24 TAKE 1 TABLET TWICE A DAY  . JARDIANCE 25 MG TABS tablet TAKE 1 TABLET DAILY  . L-Methylfolate-B6-B12 (FOLTANX) 3-35-2 MG  TABS TAKE 1 TABLET TWICE A DAY  . lisinopril (PRINIVIL,ZESTRIL) 2.5 MG tablet Take 5 mg by mouth daily.   . metoprolol tartrate (LOPRESSOR) 25 MG tablet Take 0.5 tablets (12.5 mg total) by mouth 2 (two) times daily.  Marland Kitchen oxyCODONE-acetaminophen (ROXICET) 5-325 MG tablet Take 1 tablet by mouth every 8 (eight) hours as needed for severe pain.  . prazosin (MINIPRESS) 1 MG capsule Take 1 mg by mouth at bedtime.  . rosuvastatin (CRESTOR) 20 MG tablet Take 20 mg by mouth daily.  . sertraline (ZOLOFT) 25 MG tablet Take 25 mg by mouth daily.  . traMADol (ULTRAM) 50 MG tablet Take 1 tablet (50 mg total) by mouth 2 (two) times daily as needed.   No current facility-administered medications on file prior to visit.    Review of Systems Per HPI unless specifically indicated in ROS section     Objective:    BP 126/70 mmHg  Pulse 93  Temp(Src) 97.9 F (36.6 C) (Oral)  Wt 193 lb 4 oz (87.658 kg)  SpO2 97%  Wt Readings from Last 3 Encounters:  07/05/15 193 lb 4 oz (87.658 kg)  06/14/15 192 lb 8 oz (87.317 kg)  05/08/15 193 lb 12.8 oz (87.907 kg)    Physical Exam  Constitutional: He appears well-developed and well-nourished. No distress.  Skin: Skin is warm and  dry. No rash noted. There is erythema.  Bite mark with scab R anterior abdominal wall with surrounding erythema about 4cm diameter, one streak of erythema superiorly No tick parts remain  Nursing note and vitals reviewed.     Assessment & Plan:   Problem List Items Addressed This Visit    Injury of right leg    Now followed by ortho.      Tick bite of abdomen - Primary    Tick bite without evidence of tick borne illness. Anticipate skin cellulitis after tick bite - treat with 5d doxy course, discussed photosensitivity.  Pt agrees with plan.          Follow up plan: Return if symptoms worsen or fail to improve.  Ria Bush, MD

## 2015-07-05 NOTE — Assessment & Plan Note (Signed)
Tick bite without evidence of tick borne illness. Anticipate skin cellulitis after tick bite - treat with 5d doxy course, discussed photosensitivity.  Pt agrees with plan.

## 2015-07-05 NOTE — Patient Instructions (Signed)
Likely tick bite, now with surrounding skin infection. Treat with 5 days of doxycycline twice daily with food, avoid sun.  Let us know if not improving or redness spreading.

## 2015-07-05 NOTE — Assessment & Plan Note (Signed)
Now followed by ortho.

## 2015-07-05 NOTE — Progress Notes (Signed)
Pre visit review using our clinic review tool, if applicable. No additional management support is needed unless otherwise documented below in the visit note. 

## 2015-07-18 DIAGNOSIS — S7001XD Contusion of right hip, subsequent encounter: Secondary | ICD-10-CM | POA: Diagnosis not present

## 2015-07-18 DIAGNOSIS — M1711 Unilateral primary osteoarthritis, right knee: Secondary | ICD-10-CM | POA: Diagnosis not present

## 2015-07-18 DIAGNOSIS — S8001XD Contusion of right knee, subsequent encounter: Secondary | ICD-10-CM | POA: Diagnosis not present

## 2015-07-20 ENCOUNTER — Other Ambulatory Visit: Payer: Self-pay | Admitting: Internal Medicine

## 2015-07-31 DIAGNOSIS — H26491 Other secondary cataract, right eye: Secondary | ICD-10-CM | POA: Diagnosis not present

## 2015-07-31 DIAGNOSIS — H401130 Primary open-angle glaucoma, bilateral, stage unspecified: Secondary | ICD-10-CM | POA: Diagnosis not present

## 2015-07-31 DIAGNOSIS — E119 Type 2 diabetes mellitus without complications: Secondary | ICD-10-CM | POA: Diagnosis not present

## 2015-07-31 DIAGNOSIS — D3131 Benign neoplasm of right choroid: Secondary | ICD-10-CM | POA: Diagnosis not present

## 2015-07-31 LAB — HM DIABETES EYE EXAM

## 2015-08-05 ENCOUNTER — Other Ambulatory Visit: Payer: Self-pay | Admitting: Internal Medicine

## 2015-08-12 ENCOUNTER — Ambulatory Visit: Payer: Medicare Other | Admitting: Internal Medicine

## 2015-08-15 ENCOUNTER — Ambulatory Visit (INDEPENDENT_AMBULATORY_CARE_PROVIDER_SITE_OTHER): Payer: Medicare Other | Admitting: Internal Medicine

## 2015-08-15 ENCOUNTER — Encounter: Payer: Self-pay | Admitting: Internal Medicine

## 2015-08-15 DIAGNOSIS — E1159 Type 2 diabetes mellitus with other circulatory complications: Secondary | ICD-10-CM

## 2015-08-15 LAB — POCT GLYCOSYLATED HEMOGLOBIN (HGB A1C): Hemoglobin A1C: 7.1

## 2015-08-15 NOTE — Addendum Note (Signed)
Addended by: Caprice Beaver T on: 08/15/2015 10:28 AM   Modules accepted: Orders

## 2015-08-15 NOTE — Patient Instructions (Addendum)
Patient Instructions  Please continue: - Janumet XR 1000-50 mg 2 tablets in am - Glipizide ER 5 mg in am and 10 mg in pm - Invokana 300 mg in am   Continue working on your diet and exercise - Great job so far!  Restart the B vitamins.  Please return in 3 months with your sugar log.   Your HbA1c today was 7.1%.

## 2015-08-15 NOTE — [facility] Notes (Signed)
Patient ID: William Walton, male   DOB: [birth year], 75 y.o.   MRN: [id number]  HPI: William Walton is a 75 y.o.-year-old male, returning for f/u for DM2, dx 2010, non-insulin-dependent, uncontrolled, with complications (CAD - s/p CABG, peripheral neuropathy, ED), likely 2/2 Agent Orange. Also goes to the [place] in [place] now (previosusly was going to [facility]). Last visit with me was 3 mo ago. New PCP: [name].  Since last visit, he started exercising more and reduced portions/changed diet. Lost 7 lbs since last visit!  HbA1C levels: Lab Results  Component Value Date   HGBA1C 7.5 05/08/2015   HGBA1C 7.0 02/06/2015   HGBA1C 7.0 09/21/2014  02/20/2011: A1c 7.1% 05/22/2011: A1c 6.8%, after which he was taken off the insulin >> A1C started to increase.  11/19/2011: A1c 8.5%, TSH 2.18, BUN/creatinine 16/1.0, ACR 3.7 02/24/2012: A1c 8.4%, vitamin B12 321 06/01/2012: 7.6% 01/26/2013: 7.3% - received records from [place]   Pt is on a regimen of: - Janumet XR 1000-50 mg x 2 po - Glipizide ER 5 mg in am and 10 mg in pm - Invokana 300 mg daily  Pt checks his sugars 2x a day >> reviewed detailed log: - am: 77, 88, 98-155 >> (J: 171-190; I: 90-155) >> 93, 106-134, 155, 180 >> 107-154, 188 >> 84-127, 132 - 2h after b'fast: 113 >> n/c >> 136 >> 166 - before lunch: 95-134 >> 140 >> 80, 139, 149 >> n/c >> 102 >> n/c - 2h after lunch: 80 >> n/c >> 78 >> n/c >> 103 - before dinner: 69, 91, 222 >> 120 >> n/c >> (J: 176-191, 226; I: 50x1, 98-180) >> 106-141, 151 >> 119 >> 1-1-117, 144 - 2h after dinner: 113-152, 174 >> 92-134-185, 213 >> 100-148, 180, 198 >> 113-189 >> 104-181 >> 114-189, 206  >> 135-189, 239 - bedtime: 126-165 >> n/c >> 94-198 (263) >> 118-174 >> 115-159 >> 151-185, 220 >> 148, 152 >> 159 - at 11 pm: 90 >> 90, 118, 181 >> 167 >> n/c No lows. Lowest sugar was 77 >> 90s >> 93 >> 107; he has hypoglycemia awareness at 70.  Highest sugar was 213 >> 200s >> 274 x1  day trip - forgot meds at home) >> 206  He exercises by going to the gym 3x a week.  - has mild CKD. Last BUN/creatinine:  Lab Results  Component Value Date   BUN 15 05/08/2015   BUN 19 05/30/2013   CREATININE 1.04 05/08/2015   CREATININE 1.1 05/30/2013  19/1.2 (GFR 63.4) in 01/26/2013  16/1.0 in 05/2012.  He is on Lisinopril 2.5.   - has HL. Last set of lipids:  Lab Results  Component Value Date   CHOL 131 05/08/2015   HDL 28.90* 05/08/2015   LDLCALC 63 05/08/2015   LDLDIRECT 86.0 03/23/2014   TRIG 193.0* 05/08/2015   CHOLHDL 5 05/08/2015  212/249/29/106 (01/26/2013)  200/214/26.8/130.4  He switched from Lipitor 40 to Crestor 20 mg daily after the previous results. He continues Crestor.  He is on ASA 81.  - last eye exam was:  Abstract on 08/07/2015  Component Date Value Ref Range Status  . [facility] Diabetic Eye Exam- [name].  07/31/2015 No Retinopathy  No Retinopathy Final   - + numbness and tingling in his feet. He is on Neurontin 300 mg, now 3x a day, but stopped Metanx..  He also has a history of PTSD ([employer] veteran), HTN, HL, h/o L TKR.  I  reviewed pt's medications, allergies, PMH, social hx, family hx, and changes were documented in the history of present illness. Otherwise, unchanged from my initial visit note.  ROS: Constitutional: no weight gain/loss, no fatigue, no subjective hyperthermia/hypothermia, no  poor sleep, no nocturia Eyes: no blurry vision, no xerophthalmia ENT: no sore throat, no nodules palpated in throat, no dysphagia/odynophagia, no hoarseness Cardiovascular: no CP/SOB/palpitations/leg swelling Respiratory: no cough/SOB Gastrointestinal: no N/V/D/C Musculoskeletal: no muscle/joint aches Skin: no rash Neurological: no tremors/numbness/tingling/dizziness  PE: BP 124/68 mmHg  Pulse 80  Ht 5' 7.5" (1.715 m)  Wt 186 lb (84.369 kg)  BMI 28.68 kg/m2  SpO2 95% Body mass index is 28.68 kg/(m^2).  Wt Readings from Last 3 Encounters:   08/15/15 186 lb (84.369 kg)  07/05/15 193 lb 4 oz (87.658 kg)  06/14/15 192 lb 8 oz (87.317 kg)   Constitutional: overweight, in NAD Eyes: PERRLA, EOMI, no exophthalmos ENT: moist mucous membranes, no thyromegaly, no cervical lymphadenopathy Cardiovascular: RRR, No MRG Respiratory: CTA B Gastrointestinal: abdomen soft, NT, ND, BS+ Musculoskeletal: no deformities, strength intact in all 4 Skin: moist, warm, no rashes  ASSESSMENT: 1. DM2, non-insulin-dependent, uncontrolled, with complications - CAD, s/p AMI and 5v CABG at [facility] 08/29/2010 - Peripheral neuropathy - ED  2. PN  PLAN:  1. Patient with long-standing, fairly well controlled diabetes, with  higher sugars at last visit due to snacking and increased sweets in his diet, now much improved and he also lost 7 lbs!  HbA1c today >> 7.1% (lower).  - a good HbA1c target for him of ~7.5% due to age.  - I suggested to continue current regimen:  Patient Instructions  Please continue: - Janumet XR 1000-50 mg 2 tablets in am - Glipizide ER 5 mg in am and 10 mg in pm - Invokana 300 mg in am   Continue working on your diet and exercise - Great job so far!  Restart the B vitamins.  Please return in 3 months with your sugar log.   Your HbA1c today was 7.1%.  - continue check 2 times a day, rotating checks - up to date with eye exams - Return to clinic in 3 months with sugar log   2. Peripheral neuropathy - on Neurontin 300 mg tid - advised to restart generic Metanx  - no recent exacerbations

## 2015-08-29 DIAGNOSIS — M1711 Unilateral primary osteoarthritis, right knee: Secondary | ICD-10-CM | POA: Diagnosis not present

## 2015-09-25 DIAGNOSIS — M5136 Other intervertebral disc degeneration, lumbar region: Secondary | ICD-10-CM | POA: Diagnosis not present

## 2015-09-30 ENCOUNTER — Emergency Department: Payer: Medicare Other

## 2015-09-30 ENCOUNTER — Encounter: Payer: Self-pay | Admitting: Medical Oncology

## 2015-09-30 ENCOUNTER — Emergency Department
Admission: EM | Admit: 2015-09-30 | Discharge: 2015-09-30 | Disposition: A | Discharge status: Home / Self Care | Payer: Medicare Other | Attending: Emergency Medicine | Admitting: Emergency Medicine

## 2015-09-30 DIAGNOSIS — G458 Other transient cerebral ischemic attacks and related syndromes: Secondary | ICD-10-CM

## 2015-09-30 DIAGNOSIS — I2510 Atherosclerotic heart disease of native coronary artery without angina pectoris: Secondary | ICD-10-CM | POA: Insufficient documentation

## 2015-09-30 DIAGNOSIS — Z792 Long term (current) use of antibiotics: Secondary | ICD-10-CM | POA: Diagnosis not present

## 2015-09-30 DIAGNOSIS — Z87891 Personal history of nicotine dependence: Secondary | ICD-10-CM | POA: Diagnosis not present

## 2015-09-30 DIAGNOSIS — R42 Dizziness and giddiness: Secondary | ICD-10-CM

## 2015-09-30 DIAGNOSIS — H538 Other visual disturbances: Secondary | ICD-10-CM | POA: Diagnosis not present

## 2015-09-30 DIAGNOSIS — I10 Essential (primary) hypertension: Secondary | ICD-10-CM | POA: Insufficient documentation

## 2015-09-30 DIAGNOSIS — R29818 Other symptoms and signs involving the nervous system: Secondary | ICD-10-CM | POA: Diagnosis not present

## 2015-09-30 DIAGNOSIS — Z7982 Long term (current) use of aspirin: Secondary | ICD-10-CM | POA: Insufficient documentation

## 2015-09-30 DIAGNOSIS — G459 Transient cerebral ischemic attack, unspecified: Secondary | ICD-10-CM | POA: Diagnosis not present

## 2015-09-30 DIAGNOSIS — Z79899 Other long term (current) drug therapy: Secondary | ICD-10-CM | POA: Insufficient documentation

## 2015-09-30 DIAGNOSIS — E119 Type 2 diabetes mellitus without complications: Secondary | ICD-10-CM | POA: Diagnosis not present

## 2015-09-30 HISTORY — DX: Unspecified osteoarthritis, unspecified site: M19.90

## 2015-09-30 HISTORY — DX: Essential (primary) hypertension: I10

## 2015-09-30 LAB — DIFFERENTIAL
BASOS PCT: 1 %
Basophils Absolute: 0.1 10*3/uL (ref 0–0.1)
EOS ABS: 0.2 10*3/uL (ref 0–0.7)
Eosinophils Relative: 2 %
Lymphocytes Relative: 20 %
Lymphs Abs: 2.3 10*3/uL (ref 1.0–3.6)
MONO ABS: 0.9 10*3/uL (ref 0.2–1.0)
MONOS PCT: 8 %
Neutro Abs: 7.8 10*3/uL — ABNORMAL HIGH (ref 1.4–6.5)
Neutrophils Relative %: 69 %

## 2015-09-30 LAB — COMPREHENSIVE METABOLIC PANEL
ALT: 17 U/L (ref 17–63)
AST: 20 U/L (ref 15–41)
Albumin: 4.2 g/dL (ref 3.5–5.0)
Alkaline Phosphatase: 40 U/L (ref 38–126)
Anion gap: 8 (ref 5–15)
BUN: 22 mg/dL — ABNORMAL HIGH (ref 6–20)
CHLORIDE: 107 mmol/L (ref 101–111)
CO2: 21 mmol/L — ABNORMAL LOW (ref 22–32)
Calcium: 8.8 mg/dL — ABNORMAL LOW (ref 8.9–10.3)
Creatinine, Ser: 1.21 mg/dL (ref 0.61–1.24)
GFR, EST NON AFRICAN AMERICAN: 58 mL/min — AB (ref >60)
Glucose, Bld: 117 mg/dL — ABNORMAL HIGH (ref 65–99)
POTASSIUM: 4.3 mmol/L (ref 3.5–5.1)
Sodium: 136 mmol/L (ref 135–145)
TOTAL PROTEIN: 7.4 g/dL (ref 6.5–8.1)
Total Bilirubin: 0.8 mg/dL (ref 0.3–1.2)

## 2015-09-30 LAB — TROPONIN I

## 2015-09-30 LAB — CBC
HEMATOCRIT: 44.3 % (ref 40.0–52.0)
Hemoglobin: 14.9 g/dL (ref 13.0–18.0)
MCH: 28.5 pg (ref 26.0–34.0)
MCHC: 33.7 g/dL (ref 32.0–36.0)
MCV: 84.7 fL (ref 80.0–100.0)
Platelets: 231 10*3/uL (ref 150–440)
RBC: 5.23 MIL/uL (ref 4.40–5.90)
RDW: 15.7 % — AB (ref 11.5–14.5)
WBC: 11.3 10*3/uL — ABNORMAL HIGH (ref 3.8–10.6)

## 2015-09-30 LAB — PROTIME-INR
INR: 1.1
Prothrombin Time: 14.2 seconds (ref 11.4–15.2)

## 2015-09-30 LAB — APTT: aPTT: 30 seconds (ref 24–36)

## 2015-09-30 MED ORDER — ASPIRIN 81 MG PO CHEW
324.0000 mg | CHEWABLE_TABLET | Freq: Once | ORAL | Status: AC
Start: 2015-09-30 — End: 2015-09-30
  Administered 2015-09-30: 324 mg via ORAL
  Filled 2015-09-30: qty 4

## 2015-09-30 NOTE — [facility] Notes (Signed)
°   09/30/15 1200  Clinical Encounter Type  Visited With Patient and family together  Visit Type Code  Referral From Care management  Consult/Referral To Chaplain  Spiritual Encounters  Spiritual Needs Emotional  Stress Factors  Patient Stress Factors None identified  Family Stress Factors Health changes  Patient seems to be feeling well; visit was precautionary, and pt was in very high spirits upon departure of chaplain

## 2015-09-30 NOTE — ED Notes (Signed)
Alert and oriented with no deficits and no dizziness at this time.

## 2015-09-30 NOTE — ED Triage Notes (Addendum)
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 Pt reports that he only has the dizziness with movement. Also pt reports that he was having blurred peripheral vision. Pt a&o x 4 in triage talking with complete sentences. Pt has strong equal grip strengths. Pt denies numbness/tingling.

## 2015-09-30 NOTE — ED Provider Notes (Addendum)
[facility] Emergency Department Provider Note  ____________________________________________  Time seen: Approximately 11:52 AM  I have reviewed the triage vital signs and the nursing notes.   HISTORY  Chief Complaint Dizziness and Blurred Vision   HPI William Walton is a 75 y.o. male who presents as a code stroke. Patient has a history of CAD, diabetes, hypertension, hyperlipidemia who presented for evaluation of blurry vision and lightheadedness. Patient last seen normal at 7 AM. Patient reports that he felt like he was going to pass out and when he looked to the right or left he had double vision. He denies ever having anything similar. He denies prior history of stroke. Patient is a former smoker and stopped in the 41s. Patient denies chest pain, headache, facial droop, gait instability, weakness or numbness of his extremities. He reports that his symptoms have resolved at this time.  Past Medical History:  Diagnosis Date  . Arthritis   . CAD (coronary artery disease) 08/2010   STEMI 08/2010 s/p 5v CABG at [facility], cardiac rehab - only did 7/26 sessions  . Diabetes mellitus type II 08/2010   followed by Dr. Gabriel Carina [facility]), no retinopathy Ellin Mayhew)  . ED (erectile dysfunction)    likely medication induced  . HLD (hyperlipidemia)   . HTN (hypertension)   . Hypertension   . Spondyloarthropathy 05/2014   by Xray  . Type I (juvenile type) diabetes mellitus with peripheral circulatory disorders, uncontrolled(250.73)     Patient Active Problem List   Diagnosis Date Noted  . Tick bite of abdomen 07/05/2015  . Injury of right leg 06/14/2015  . Primary osteoarthritis of both knees 05/18/2014  . Spondyloarthropathy 05/18/2014  . Low back pain 05/10/2014  . Type 2 diabetes mellitus with other circulatory complications ([place]) 123456  . Medicare annual wellness visit, initial 10/21/2010  . ED (erectile dysfunction) 10/21/2010  . HTN (hypertension)   . HLD  (hyperlipidemia)   . CAD (coronary artery disease)     Past Surgical History:  Procedure Laterality Date  . CATARACT EXTRACTION  11/2010  . CORONARY ARTERY BYPASS GRAFT  08/2010   5v, [facility] Dr. Julaine Hua  . ESOPHAGOGASTRODUODENOSCOPY  08/2010   normal esophagus, localized gastric erythema  . TOTAL KNEE ARTHROPLASTY Left 08/2012   [facility] at [facility]  . US ECHOCARDIOGRAPHY  08/2010   mild LV dysfxn (EF 45%) with severe LVH, nl R vent sys fxn    Prior to Admission medications   Medication Sig Start Date End Date Taking? Authorizing Provider  amLODipine (NORVASC) 5 MG tablet Take 5 mg by mouth daily.   Yes Historical Provider, MD  aspirin 81 MG tablet Take 81 mg by mouth every morning.    Yes Historical Provider, MD  gabapentin (NEURONTIN) 300 MG capsule TAKE 3 CAPSULES TWICE A DAY 07/05/15  Yes Ria Bush, MD  GLIPIZIDE XL 5 MG 24 hr tablet TAKE 1 TABLET IN THE MORNING AND 2 TABLETS IN THE EVENING 07/22/15  Yes [name], MD  INVOKANA 300 MG TABS tablet TAKE 1 TABLET EVERY MORNING 06/21/15  Yes [name], MD  JANUMET XR 50-1000 MG TB24 TAKE 1 TABLET TWICE A DAY Patient taking differently: 2am 08/05/15  Yes [name], MD  L-Methylfolate-B6-B12 (FOLTANX) 3-35-2 MG TABS TAKE 1 TABLET TWICE A DAY 03/27/14  Yes [name], MD  metoprolol tartrate (LOPRESSOR) 25 MG tablet Take 0.5 tablets (12.5 mg total) by mouth 2 (two) times daily. Patient taking differently: Take 25 mg by mouth 2 (two) times  daily. Am pm 10/21/10  Yes Ria Bush, MD  prazosin (MINIPRESS) 1 MG capsule Take 1 mg by mouth at bedtime.   Yes Historical Provider, MD  rosuvastatin (CRESTOR) 20 MG tablet Take 20 mg by mouth daily.   Yes Historical Provider, MD  sertraline (ZOLOFT) 25 MG tablet Take 100 mg by mouth every morning.    Yes Historical Provider, MD  acetaminophen (TYLENOL) 325 MG tablet Take 650 mg by mouth every 6 (six) hours as needed. Reported on 08/15/2015    Historical Provider, MD  amoxicillin  (AMOXIL) 500 MG capsule Take 2,000 mg by mouth daily. Take one hour prior to dental treatment    Historical Provider, MD  FREESTYLE LITE test strip USE TO TEST BLOOD SUGAR TWICE A DAY AS INSTRUCTED 08/05/15   [name], MD    Allergies Review of patient's allergies indicates no known allergies.  Family History  Problem Relation Age of Onset  . Cancer Father 59    died of prostate or bladder CA, pt unsure  . Coronary artery disease Brother 59    bypass  . Hyperlipidemia Brother   . Hypertension Brother   . Coronary artery disease Maternal Uncle 59    MI  . Diabetes Neg Hx   . Stroke Neg Hx     Social History Social History  Substance Use Topics  . Smoking status: Former Smoker    Quit date: 02/16/1966  . Smokeless tobacco: Never Used  . Alcohol use No    Review of Systems  Constitutional: Negative for fever. + Lightheadedness Eyes: Negative for visual changes. + Blurry vision ENT: Negative for sore throat. Cardiovascular: Negative for chest pain. Respiratory: Negative for shortness of breath. Gastrointestinal: Negative for abdominal pain, vomiting or diarrhea. Genitourinary: Negative for dysuria. Musculoskeletal: Negative for back pain. Skin: Negative for rash. Neurological: Negative for headaches, weakness or numbness.   ____________________________________________   PHYSICAL EXAM:  VITAL SIGNS: ED Triage Vitals [09/30/15 1129]  Enc Vitals Group     BP 91/63     Pulse Rate 72     Resp 17     Temp 98.2 F (36.8 C)     Temp Source Oral     SpO2 96 %     Weight 175 lb (79.4 kg)     Height 5\' 8"  (1.727 m)     Head Circumference      Peak Flow      Pain Score      Pain Loc      Pain Edu?      Excl. in [place]?     Constitutional: Alert and oriented. Well appearing and in no apparent distress. HEENT:      Head: Normocephalic and atraumatic.         Eyes: Conjunctivae are normal. Sclera is non-icteric. EOMI. PERRL      Mouth/Throat: Mucous membranes are  moist.       Neck: Supple with no signs of meningismus. Cardiovascular: Regular rate and rhythm. No murmurs, gallops, or rubs. 2+ symmetrical distal pulses are present in all extremities. No JVD. Respiratory: Normal respiratory effort. Lungs are clear to auscultation bilaterally. No wheezes, crackles, or rhonchi.  Gastrointestinal: Soft, non tender, and non distended with positive bowel sounds. No rebound or guarding. Genitourinary: No CVA tenderness. Musculoskeletal: Nontender with normal range of motion in all extremities. No edema, cyanosis, or erythema of extremities. Neurologic: Normal speech and language. A & O x3, PERRL, no nystagmus, CN II-XII intact, motor testing reveals good tone  and bulk throughout. There is no evidence of pronator drift or dysmetria. Muscle strength is 5/5 throughout. Deep tendon reflexes are 2+ throughout with downgoing toes. Sensory examination is intact. Gait deferred. Skin: Skin is warm, dry and intact. No rash noted. Psychiatric: Mood and affect are normal. Speech and behavior are normal.  ____________________________________________   LABS (all labs ordered are listed, but only abnormal results are displayed)  Labs Reviewed  CBC - Abnormal; Notable for the following:       Result Value   WBC 11.3 (*)    RDW 15.7 (*)    All other components within normal limits  DIFFERENTIAL - Abnormal; Notable for the following:    Neutro Abs 7.8 (*)    All other components within normal limits  COMPREHENSIVE METABOLIC PANEL - Abnormal; Notable for the following:    CO2 21 (*)    Glucose, Bld 117 (*)    BUN 22 (*)    Calcium 8.8 (*)    GFR calc non Af Amer 58 (*)    All other components within normal limits  PROTIME-INR  APTT  TROPONIN I  CBG MONITORING, ED   ____________________________________________  EKG  ED ECG REPORT I, Rudene Re, the attending physician, personally viewed and interpreted this ECG. Normal sinus rhythm, rate of 66, normal  intervals, normal axis, no ST elevations or depressions. ____________________________________________  RADIOLOGY  Head CT: negative  ____________________________________________   PROCEDURES  Procedure(s) performed: None Procedures Critical Care performed:  None ____________________________________________   INITIAL IMPRESSION / ASSESSMENT AND PLAN / ED COURSE  75 y.o. male who presents as a code stroke. Patient has a history of CAD, diabetes, hypertension, hyperlipidemia who presented for evaluation of blurry vision and lightheadedness.  Patient evaluated by ER and neurology physician upon arrival. NIHSS 0. Symptoms resolved at this time. Head CT negative. EKG with no evidence of arrhythmia. Labs pending. MRI pending. ASA given.  ____________________ 12:24 PM on 09/30/2015 -----------------------------------------  Neurology recommended admission for TIA work up. Patient remains neuro intact. Discussed with Hospitalist for admission. MRI pending.  Clinical Course  Comment By Time  MRI/MRA brain negative. Patient remains neurologically intact. Patient refuses admission and wishes to follow up with his primary care doctor. I discussed with Dr. Doy Mince, neurology and with the hospitalist and they both agree that patient is safe for f/u as outpatient. Will dc at this time with strict return precautions for any signs of stroke. Rudene Re, MD 08/14 1422   _____  Pertinent labs & imaging results that were available during my care of the patient were reviewed by me and considered in my medical decision making (see chart for details).    ____________________________________________   FINAL CLINICAL IMPRESSION(S) / ED DIAGNOSES  Final diagnoses:  Transient cerebral ischemia, unspecified transient cerebral ischemia type      NEW MEDICATIONS STARTED DURING THIS VISIT:  New Prescriptions   No medications on file     Note:  This document was prepared using Dragon  voice recognition software and may include unintentional dictation errors.    Rudene Re, MD 09/30/15 Albert Lea, MD 09/30/15 [phone]

## 2015-09-30 NOTE — Consult Note (Signed)
Referring Physician: Alfred Levins    Chief Complaint: Dizziness, blurred vision  HPI: William Walton is an 75 y.o. male with a history of DM and HTN who reports that he awakened normal today.  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  Then noted that when he looked to either the left or the right his vision was blurred.  Symptoms did not improve and patient presented for evaluation at that time.  Blurry vision has resolved but he continues to have some dizziness with movement.  Initial NIHSS of 0.   Date last known well: 09/30/2015 Time last known well: Time: 07:00 tPA Given: No: Outside treatment window  Past Medical History:  Diagnosis Date  . Arthritis   . CAD (coronary artery disease) 08/2010   STEMI 08/2010 s/p 5v CABG at [facility], cardiac rehab - only did 7/26 sessions  . Diabetes mellitus type II 08/2010   followed by Dr. Gabriel Carina [facility]), no retinopathy Ellin Mayhew)  . ED (erectile dysfunction)    likely medication induced  . HLD (hyperlipidemia)   . HTN (hypertension)   . Hypertension   . Spondyloarthropathy 05/2014   by Xray  . Type I (juvenile type) diabetes mellitus with peripheral circulatory disorders, uncontrolled(250.73)     Past Surgical History:  Procedure Laterality Date  . CATARACT EXTRACTION  11/2010  . CORONARY ARTERY BYPASS GRAFT  08/2010   5v, [facility] Dr. Julaine Hua  . ESOPHAGOGASTRODUODENOSCOPY  08/2010   normal esophagus, localized gastric erythema  . TOTAL KNEE ARTHROPLASTY Left 08/2012   [facility] at [place]  . US ECHOCARDIOGRAPHY  08/2010   mild LV dysfxn (EF 45%) with severe LVH, nl R vent sys fxn    Family History  Problem Relation Age of Onset  . Cancer Father 59    died of prostate or bladder CA, pt unsure  . Coronary artery disease Brother 59    bypass  . Hyperlipidemia Brother   . Hypertension Brother   . Coronary artery disease Maternal Uncle 59    MI  . Diabetes Neg Hx   . Stroke Neg Hx    Social  History:  reports that he quit smoking about 49 years ago. He has never used smokeless tobacco. He reports that he does not drink alcohol or use drugs.  Allergies: No Known Allergies  Medications: I have reviewed the patient's current medications. Prior to Admission medications   Medication Sig Start Date End Date Taking? Authorizing Provider  acetaminophen (TYLENOL) 325 MG tablet Take 650 mg by mouth every 6 (six) hours as needed. Reported on 08/15/2015    Historical Provider, MD  amLODipine (NORVASC) 5 MG tablet Take 5 mg by mouth daily.    Historical Provider, MD  amoxicillin (AMOXIL) 500 MG capsule Take 2,000 mg by mouth daily. Take one hour prior to dental treatment    Historical Provider, MD  aspirin 81 MG tablet Take 81 mg by mouth daily.      Historical Provider, MD  FREESTYLE LITE test strip USE TO TEST BLOOD SUGAR TWICE A DAY AS INSTRUCTED 08/05/15   [name], MD  gabapentin (NEURONTIN) 300 MG capsule TAKE 3 CAPSULES TWICE A DAY 07/05/15   Ria Bush, MD  GLIPIZIDE XL 5 MG 24 hr tablet TAKE 1 TABLET IN THE MORNING AND 2 TABLETS IN THE EVENING 07/22/15   [name], MD  guaiFENesin-codeine [facility]) 100-10 MG/5ML syrup Take 5 mLs by mouth 2 (two) times daily as needed  for cough (sedation precautions). Patient not taking: Reported on 08/15/2015 04/25/12   Ria Bush, MD  HYDROcodone-acetaminophen (NORCO/VICODIN) 5-325 MG tablet Take 1 tablet by mouth 3 (three) times daily as needed for moderate pain. Patient not taking: Reported on 08/15/2015 06/14/15   Ria Bush, MD  INVOKANA 300 MG TABS tablet TAKE 1 TABLET EVERY MORNING 06/21/15   [name], MD  JANUMET XR 50-1000 MG TB24 TAKE 1 TABLET TWICE A DAY 08/05/15   [name], MD  JARDIANCE 25 MG TABS tablet TAKE 1 TABLET DAILY 08/02/14   [name], MD  L-Methylfolate-B6-B12 [facility]) 3-35-2 MG TABS TAKE 1 TABLET TWICE A DAY Patient not taking: Reported on 08/15/2015 03/27/14   [name], MD   lisinopril (PRINIVIL,ZESTRIL) 2.5 MG tablet Take 5 mg by mouth daily.     Historical Provider, MD  metoprolol tartrate (LOPRESSOR) 25 MG tablet Take 0.5 tablets (12.5 mg total) by mouth 2 (two) times daily. 10/21/10   Ria Bush, MD  oxyCODONE-acetaminophen (ROXICET) 5-325 MG tablet Take 1 tablet by mouth every 8 (eight) hours as needed for severe pain. 06/21/15   Ria Bush, MD  prazosin (MINIPRESS) 1 MG capsule Take 1 mg by mouth at bedtime.    Historical Provider, MD  rosuvastatin (CRESTOR) 20 MG tablet Take 20 mg by mouth daily.    Historical Provider, MD  sertraline (ZOLOFT) 25 MG tablet Take 25 mg by mouth daily.    Historical Provider, MD  traMADol (ULTRAM) 50 MG tablet Take 1 tablet (50 mg total) by mouth 2 (two) times daily as needed. Patient not taking: Reported on 08/15/2015 05/10/14   Ria Bush, MD    ROS: History obtained from the patient  General ROS: negative for - chills, fatigue, fever, night sweats, weight gain or weight loss Psychological ROS: negative for - behavioral disorder, hallucinations, memory difficulties, mood swings or suicidal ideation Ophthalmic ROS: negative for - blurry vision, double vision, eye pain or loss of vision ENT ROS: negative for - epistaxis, nasal discharge, oral lesions, sore throat, tinnitus or vertigo Allergy and Immunology ROS: negative for - hives or itchy/watery eyes Hematological and Lymphatic ROS: negative for - bleeding problems, bruising or swollen lymph nodes Endocrine ROS: negative for - galactorrhea, hair pattern changes, polydipsia/polyuria or temperature intolerance Respiratory ROS: negative for - cough, hemoptysis, shortness of breath or wheezing Cardiovascular ROS: negative for - chest pain, dyspnea on exertion, edema or irregular heartbeat Gastrointestinal ROS: negative for - abdominal pain, diarrhea, hematemesis, nausea/vomiting or stool incontinence Genito-Urinary ROS: negative for - dysuria, hematuria,  incontinence or urinary frequency/urgency Musculoskeletal ROS: joint pain Neurological ROS: as noted in HPI Dermatological ROS: negative for rash and skin lesion changes  Physical Examination: Blood pressure 91/63, pulse 72, temperature 98.2 F (36.8 C), temperature source Oral, resp. rate 17, height 5\' 8"  (1.727 m), weight 79.4 kg (175 lb), SpO2 96 %.  HEENT-  Normocephalic, no lesions, without obvious abnormality.  Normal external eye and conjunctiva.  Normal TM's bilaterally.  Normal auditory canals and external ears. Normal external nose, mucus membranes and septum.  Normal pharynx. Cardiovascular- S1, S2 normal, pulses palpable throughout   Lungs- chest clear, no wheezing, rales, normal symmetric air entry Abdomen- soft, non-tender; bowel sounds normal; no masses,  no organomegaly Extremities- no edema Lymph-no adenopathy palpable Musculoskeletal-no joint tenderness, deformity or swelling Skin-warm and dry, no hyperpigmentation, vitiligo, or suspicious lesions  Neurological Examination Mental Status: Alert, oriented, thought content appropriate.  Speech fluent without evidence of aphasia.  Able to follow 3  step commands without difficulty. Cranial Nerves: II: Discs flat bilaterally; Visual fields grossly normal, pupils equal, round, reactive to light and accommodation III,IV, VI: ptosis not present, extra-ocular motions intact bilaterally V,VII: smile symmetric, facial light touch sensation normal bilaterally VIII: hearing normal bilaterally IX,X: gag reflex present XI: bilateral shoulder shrug XII: midline tongue extension Motor: Right : Upper extremity   5/5    Left:     Upper extremity   5/5  Lower extremity   5/5     Lower extremity   5/5 Tone and bulk:normal tone throughout; no atrophy noted Sensory: Pinprick and light touch intact throughout, bilaterally Deep Tendon Reflexes: 2+ in the upper extremities, 1+ in the RLE and absent in the LLE Plantars: Right:  downgoing   Left: downgoing Cerebellar: Normal finger-to-nose and normal heel-to-shin testing bilaterally Gait: guarded   Laboratory Studies:  Basic Metabolic Panel: No results for input(s): NA, K, CL, CO2, GLUCOSE, BUN, CREATININE, CALCIUM, MG, PHOS in the last 168 hours.  Liver Function Tests: No results for input(s): AST, ALT, ALKPHOS, BILITOT, PROT, ALBUMIN in the last 168 hours. No results for input(s): LIPASE, AMYLASE in the last 168 hours. No results for input(s): AMMONIA in the last 168 hours.  CBC:  Recent Labs Lab 09/30/15 1148  WBC 11.3*  NEUTROABS 7.8*  HGB 14.9  HCT 44.3  MCV 84.7  PLT 231    Cardiac Enzymes: No results for input(s): CKTOTAL, CKMB, CKMBINDEX, TROPONINI in the last 168 hours.  BNP: Invalid input(s): POCBNP  CBG: No results for input(s): GLUCAP in the last 168 hours.  Microbiology: No results found for this or any previous visit.  Coagulation Studies: No results for input(s): LABPROT, INR in the last 72 hours.  Urinalysis: No results for input(s): COLORURINE, LABSPEC, PHURINE, GLUCOSEU, HGBUR, BILIRUBINUR, KETONESUR, PROTEINUR, UROBILINOGEN, NITRITE, LEUKOCYTESUR in the last 168 hours.  Invalid input(s): APPERANCEUR  Lipid Panel:    Component Value Date/Time   CHOL 131 05/08/2015 1013   TRIG 193.0 (H) 05/08/2015 1013   HDL 28.90 (L) 05/08/2015 1013   CHOLHDL 5 05/08/2015 1013   VLDL 38.6 05/08/2015 1013   LDLCALC 63 05/08/2015 1013    HgbA1C:  Lab Results  Component Value Date   HGBA1C 7.1 08/15/2015    Urine Drug Screen:  No results found for: LABOPIA, COCAINSCRNUR, LABBENZ, AMPHETMU, THCU, LABBARB  Alcohol Level: No results for input(s): ETH in the last 168 hours.  Other results: EKG: sinus rhythm at 66 bpm.  Imaging: Ct Head Code Stroke W/o Cm  Result Date: 09/30/2015 CLINICAL DATA:  Code stroke. 75 year old male with acute onset dizziness at 0700 hours. Initial encounter. EXAM: CT HEAD WITHOUT CONTRAST TECHNIQUE:  Contiguous axial images were obtained from the base of the skull through the vertex without intravenous contrast. COMPARISON:  None. FINDINGS: Visualized paranasal sinuses and mastoids are well pneumatized. Visualized orbits and scalp soft tissues are within normal limits. Calcified atherosclerosis at the skull base. No acute osseous abnormality identified. Cerebral volume is within normal limits for age. No midline shift, ventriculomegaly, mass effect, evidence of mass lesion, intracranial hemorrhage or evidence of cortically based acute infarction. Gray-white matter differentiation is within normal limits throughout the brain. No suspicious intracranial vascular hyperdensity. ASPECTS [facility] Stroke Program Early CT Score) Total score (0-10 with 10 being normal): 10 IMPRESSION: 1. Normal for age non contrast CT appearance of the brain. 2. ASPECTS is 10. 3. Study discussed by telephone with Dr. Larae Grooms on 09/30/2015 at 11:47 . Electronically Signed  By: Genevie Ann M.D.   On: 09/30/2015 11:48    Assessment: 75 y.o. male presenting with complaints of dizziness and blurred vision.  NIHSS of 0.  Head CT personally reviewed and shows no acute changes.  Patient with multiple vascular risk factors but symptoms are improving.  Outside window for tPA and symptoms not severe enough to consider endovascular therapies.  With nonspecificity of symptoms may not even be ischemic in nature.  Further work up recommended.  Patient on ASA daily.    Stroke Risk Factors - diabetes mellitus, hyperlipidemia and hypertension  Plan: 1. MRI, MRA  of the brain without contrast 2. NPO until RN stroke swallow screen 3. Telemetry monitoring 4. Frequent neuro checks  Case discussed with Dr. Unk Lightning, MD Neurology [phone] 09/30/2015, 12:06 PM  Addendum: MRI of the brain personally reviewed and shows no acute changes.  No evidence of emergent large vessel disease.  Patient wishes to pursue remainder  of stroke/TIA work up as an outpatient.  To return if symptoms worsen or recur.    Alexis Goodell, MD Neurology [phone]

## 2015-09-30 NOTE — Consult Note (Signed)
Medical Consultation  William Walton [id number] DOB: 12/24/1940 DOA: 09/30/2015 PCP: Ria Bush, MD   Requesting physician: Dr. Alfred Levins Date of consultation: 09/30/2015 Reason for consultation: TIA  Impression/Recommendations  75 year old male with a history of ASCVD who presents to the ER with dizziness and blurred vision.  1. Dizziness and blurred vision: Patient symptoms have improved. MRI of the brain is negative for acute stroke. Patient can follow-up with his PCP for carotid Doppler and echocardiogram. Patient should continue aspirin and high intensity statin medication, provided that there is no contraindication to this.  2. ASCVD: Patient should continue on aspirin, Norvasc, metoprolol, Crestor and lisinopril.  3. Essential hypertension: Continue outpatient medications.  4. Hyperlipidemia: Continue high-intensity statin with Crestor.   Chief Complaint:  Blurred vision  HPI:  75 year old male with a history of ASCVD who presents with above complaint. Patient reports this morning he developed dizziness and blurred vision. He had no other focal neurological deficits including aphasia, facial droop or weakness of arms or legs. Symptoms have subsided prior to arrival in emergency room. He underwent MRI which shows no evidence of CVA. I spoke with neurologist who says it is okay to discharge patient with close follow-up with his primary care physician for echocardiogram and carotid Doppler, as patient is interested in going home.  Review of Systems  Constitutional: Negative for fever, chills weight loss HENT: Negative for ear pain, nosebleeds, congestion, facial swelling, rhinorrhea, neck pain, neck stiffness and ear discharge.   Blurred vision and dizziness which has improved. Respiratory: Negative for cough, shortness of breath, wheezing  Cardiovascular: Negative for chest pain, palpitations and leg swelling.  Gastrointestinal: Negative for heartburn, abdominal pain,  vomiting, diarrhea or consitpation Genitourinary: Negative for dysuria, urgency, frequency, hematuria Musculoskeletal: Negative for back pain or joint pain Neurological: Negative for seizures, syncope, focal weakness,  numbness and headaches.  Hematological: Does not bruise/bleed easily.  Psychiatric/Behavioral: Negative for hallucinations, confusion, dysphoric mood   Past Medical History:  Diagnosis Date  . Arthritis   . CAD (coronary artery disease) 08/2010   STEMI 08/2010 s/p 5v CABG at [facility], cardiac rehab - only did 7/26 sessions  . Diabetes mellitus type II 08/2010   followed by Dr. Gabriel Carina [facility]), no retinopathy Ellin Mayhew)  . ED (erectile dysfunction)    likely medication induced  . HLD (hyperlipidemia)   . HTN (hypertension)   . Hypertension   . Spondyloarthropathy 05/2014   by Xray  . 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 CATARACT EXTRACTION  11/2010  . CORONARY ARTERY BYPASS GRAFT  08/2010   5v, [facility] Dr. Julaine Hua  . ESOPHAGOGASTRODUODENOSCOPY  08/2010   normal esophagus, localized gastric erythema  . TOTAL KNEE ARTHROPLASTY Left 08/2012   [facility] at [place]  . US ECHOCARDIOGRAPHY  08/2010   mild LV dysfxn (EF 45%) with severe LVH, nl R vent sys fxn   Social History:  reports that he quit smoking about 49 years ago. He has never used smokeless tobacco. He reports that he does not drink alcohol or use drugs.  No Known Allergies Family History  Problem Relation Age of Onset  . Cancer Father 58    died of prostate or bladder CA, pt unsure  . Coronary artery disease Brother 59    bypass  . Hyperlipidemia Brother   . Hypertension Brother   . Coronary artery disease Maternal Uncle 59    MI  . Diabetes Neg  Hx   . Stroke Neg Hx     Prior to Admission medications   Medication Sig Start Date End Date Taking? Authorizing Provider  acetaminophen (TYLENOL) 325 MG tablet  Take 650 mg by mouth every 6 (six) hours as needed. Reported on 08/15/2015    Historical Provider, MD  amLODipine (NORVASC) 5 MG tablet Take 5 mg by mouth daily.    Historical Provider, MD  amoxicillin (AMOXIL) 500 MG capsule Take 2,000 mg by mouth daily. Take one hour prior to dental treatment    Historical Provider, MD  aspirin 81 MG tablet Take 81 mg by mouth daily.      Historical Provider, MD  FREESTYLE LITE test strip USE TO TEST BLOOD SUGAR TWICE A DAY AS INSTRUCTED 08/05/15   [name], MD  gabapentin (NEURONTIN) 300 MG capsule TAKE 3 CAPSULES TWICE A DAY 07/05/15   Ria Bush, MD  GLIPIZIDE XL 5 MG 24 hr tablet TAKE 1 TABLET IN THE MORNING AND 2 TABLETS IN THE EVENING 07/22/15   [name], MD  guaiFENesin-codeine [facility]) 100-10 MG/5ML syrup Take 5 mLs by mouth 2 (two) times daily as needed for cough (sedation precautions). Patient not taking: Reported on 08/15/2015 04/25/12   Ria Bush, MD  HYDROcodone-acetaminophen (NORCO/VICODIN) 5-325 MG tablet Take 1 tablet by mouth 3 (three) times daily as needed for moderate pain. Patient not taking: Reported on 08/15/2015 06/14/15   Ria Bush, MD  INVOKANA 300 MG TABS tablet TAKE 1 TABLET EVERY MORNING 06/21/15   [name], MD  JANUMET XR 50-1000 MG TB24 TAKE 1 TABLET TWICE A DAY 08/05/15   [name], MD  JARDIANCE 25 MG TABS tablet TAKE 1 TABLET DAILY 08/02/14   [name], MD  L-Methylfolate-B6-B12 Lebron Quam) 3-35-2 MG TABS TAKE 1 TABLET TWICE A DAY Patient not taking: Reported on 08/15/2015 03/27/14   [name], MD  lisinopril (PRINIVIL,ZESTRIL) 2.5 MG tablet Take 5 mg by mouth daily.     Historical Provider, MD  metoprolol tartrate (LOPRESSOR) 25 MG tablet Take 0.5 tablets (12.5 mg total) by mouth 2 (two) times daily. 10/21/10   Ria Bush, MD  oxyCODONE-acetaminophen (ROXICET) 5-325 MG tablet Take 1 tablet by mouth every 8 (eight) hours as needed for severe pain. 06/21/15   Ria Bush,  MD  prazosin (MINIPRESS) 1 MG capsule Take 1 mg by mouth at bedtime.    Historical Provider, MD  rosuvastatin (CRESTOR) 20 MG tablet Take 20 mg by mouth daily.    Historical Provider, MD  sertraline (ZOLOFT) 25 MG tablet Take 25 mg by mouth daily.    Historical Provider, MD  traMADol (ULTRAM) 50 MG tablet Take 1 tablet (50 mg total) by mouth 2 (two) times daily as needed. Patient not taking: Reported on 08/15/2015 05/10/14   Ria Bush, MD    Physical Exam: Blood pressure 116/74, pulse 64, temperature 98.2 F (36.8 C), temperature source Oral, resp. rate 16, height 5\' 8"  (1.727 m), weight 79.4 kg (175 lb), SpO2 94 %. @VITALS2 @ [employer]   09/30/15 1129  Weight: 79.4 kg (175 lb)   No intake or output data in the 24 hours ending 09/30/15 1400   Constitutional: Appears well-developed and well-nourished. No distress. HENT: Normocephalic. Marland Kitchen Oropharynx is clear and moist.  Eyes: Conjunctivae and EOM are normal. PERRLA, no scleral icterus.  Neck: Normal ROM. Neck supple. No JVD. No tracheal deviation. CVS: RRR, S1/S2 +, no murmurs, no gallops, no carotid bruit.  Pulmonary: Effort and breath sounds normal, no stridor, rhonchi,  wheezes, rales.  Abdominal: Soft. BS +,  no distension, tenderness, rebound or guarding.  Musculoskeletal: Normal range of motion. No edema and no tenderness.  Neuro: Alert. CN 2-12 grossly intact. No focal deficits. Skin: Skin is warm and dry. No rash noted. Psychiatric: Normal mood and affect.    Labs  Basic Metabolic Panel:  Recent Labs Lab 09/30/15 1148  NA 136  K 4.3  CL 107  CO2 21*  GLUCOSE 117*  BUN 22*  CREATININE 1.21  CALCIUM 8.8*   Liver Function Tests:  Recent Labs Lab 09/30/15 1148  AST 20  ALT 17  ALKPHOS 40  BILITOT 0.8  PROT 7.4  ALBUMIN 4.2   No results for input(s): LIPASE, AMYLASE in the last 168 hours.  CBC:  Recent Labs Lab 09/30/15 1148  WBC 11.3*  NEUTROABS 7.8*  HGB 14.9  HCT 44.3  MCV 84.7  PLT  231   Cardiac Enzymes:  Recent Labs Lab 09/30/15 1148  TROPONINI <0.03   BNP: Invalid input(s): POCBNP CBG: No results for input(s): GLUCAP in the last 168 hours.  Radiological Exams: Mr Angiogram Head Wo Contrast  Result Date: 09/30/2015 CLINICAL DATA:  Dizziness and blurry vision beginning this morning. EXAM: MRI HEAD WITHOUT CONTRAST MRA HEAD WITHOUT CONTRAST TECHNIQUE: Multiplanar, multiecho pulse sequences of the brain and surrounding structures were obtained without intravenous contrast. Angiographic images of the head were obtained using MRA technique without contrast. COMPARISON:  Head CT earlier today FINDINGS: MRI HEAD FINDINGS Some sequences are mildly motion degraded. There is no evidence of acute infarct, intracranial hemorrhage, mass, midline shift, or extra-axial fluid collection. Ventricles and sulci are normal. Periventricular and subcortical cerebral white matter T2 hyperintensities are nonspecific but compatible with minimal chronic small vessel ischemic disease. Prior bilateral cataract extraction is noted. Major intracranial vascular flow voids are preserved. Paranasal sinuses and mastoid air cells are clear. MRA HEAD FINDINGS The study is moderately motion degraded. The visualized distal vertebral arteries are patent to the basilar, with assessment of the vertebrobasilar junction limited by motion artifact. Left PICA origin is patent. Right PICA origin was not imaged. SCA origins are grossly patent. Basilar artery is patent without stenosis. Posterior communicating arteries are not identified. PCAs are patent with suggestion of a mild left PCA stenosis near the P1 - P2 junction, however assessment limited by motion. No significant proximal right PCA stenosis is seen. The internal carotid arteries are patent from skullbase to carotid termini. No flow limiting ICA stenosis is identified, however evaluation is limited by motion artifact, particularly at the cavernous and proximal  supraclinoid levels. ACAs and MCAs are patent without evidence of major branch occlusion or significant proximal stenosis, with evaluation limited by motion artifact particularly near the MCA bifurcations. No sizable intracranial aneurysm is identified within limitations of motion. IMPRESSION: 1. No acute intracranial abnormality. 2. Minimal chronic small vessel ischemic disease. 3. Motion degraded MRA without evidence of major vessel occlusion or flow limiting proximal stenosis. Electronically Signed   By: Logan Bores M.D.   On: 09/30/2015 13:51   Mr Brain Wo Contrast  Result Date: 09/30/2015 CLINICAL DATA:  Dizziness and blurry vision beginning this morning. EXAM: MRI HEAD WITHOUT CONTRAST MRA HEAD WITHOUT CONTRAST TECHNIQUE: Multiplanar, multiecho pulse sequences of the brain and surrounding structures were obtained without intravenous contrast. Angiographic images of the head were obtained using MRA technique without contrast. COMPARISON:  Head CT earlier today FINDINGS: MRI HEAD FINDINGS Some sequences are mildly motion degraded. There is no evidence of acute  infarct, intracranial hemorrhage, mass, midline shift, or extra-axial fluid collection. Ventricles and sulci are normal. Periventricular and subcortical cerebral white matter T2 hyperintensities are nonspecific but compatible with minimal chronic small vessel ischemic disease. Prior bilateral cataract extraction is noted. Major intracranial vascular flow voids are preserved. Paranasal sinuses and mastoid air cells are clear. MRA HEAD FINDINGS The study is moderately motion degraded. The visualized distal vertebral arteries are patent to the basilar, with assessment of the vertebrobasilar junction limited by motion artifact. Left PICA origin is patent. Right PICA origin was not imaged. SCA origins are grossly patent. Basilar artery is patent without stenosis. Posterior communicating arteries are not identified. PCAs are patent with suggestion of a mild  left PCA stenosis near the P1 - P2 junction, however assessment limited by motion. No significant proximal right PCA stenosis is seen. The internal carotid arteries are patent from skullbase to carotid termini. No flow limiting ICA stenosis is identified, however evaluation is limited by motion artifact, particularly at the cavernous and proximal supraclinoid levels. ACAs and MCAs are patent without evidence of major branch occlusion or significant proximal stenosis, with evaluation limited by motion artifact particularly near the MCA bifurcations. No sizable intracranial aneurysm is identified within limitations of motion. IMPRESSION: 1. No acute intracranial abnormality. 2. Minimal chronic small vessel ischemic disease. 3. Motion degraded MRA without evidence of major vessel occlusion or flow limiting proximal stenosis. Electronically Signed   By: Logan Bores M.D.   On: 09/30/2015 13:51   Ct Head Code Stroke W/o Cm  Result Date: 09/30/2015 CLINICAL DATA:  Code stroke. 75 year old male with acute onset dizziness at 0700 hours. Initial encounter. EXAM: CT HEAD WITHOUT CONTRAST TECHNIQUE: Contiguous axial images were obtained from the base of the skull through the vertex without intravenous contrast. COMPARISON:  None. FINDINGS: Visualized paranasal sinuses and mastoids are well pneumatized. Visualized orbits and scalp soft tissues are within normal limits. Calcified atherosclerosis at the skull base. No acute osseous abnormality identified. Cerebral volume is within normal limits for age. No midline shift, ventriculomegaly, mass effect, evidence of mass lesion, intracranial hemorrhage or evidence of cortically based acute infarction. Gray-white matter differentiation is within normal limits throughout the brain. No suspicious intracranial vascular hyperdensity. ASPECTS [facility] Early Stroke Program Early CT Score) Total score (0-10 with 10 being normal): 10 IMPRESSION: 1. Normal for age non contrast CT appearance of  the brain. 2. ASPECTS is 10. 3. Study discussed by telephone with Dr. Larae Grooms on 09/30/2015 at 11:47 . Electronically Signed   By: Genevie Ann M.D.   On: 09/30/2015 11:48    EKG: NSR NO St ELEVATION OR DEPRESSION   Thank you for allowing me to participate in the care of your patient. We will continue to follow.   Note: This dictation was prepared with Dragon dictation along with smaller phrase technology. Any transcriptional errors that result from this process are unintentional.  Time spent: 79 MINUTES  Onnie Alatorre, MD

## 2015-09-30 NOTE — Discharge Instructions (Signed)
Follow-up with your doctor in 1-2 days for further workup for possible stroke. Continue to take your statin and aspirin. Return to the emergency department if you have severe headache, facial droop, weakness or numbness of extremities, difficulty with speech, difficulty walking, or any other symptoms concerning to you.

## 2015-10-02 ENCOUNTER — Ambulatory Visit (INDEPENDENT_AMBULATORY_CARE_PROVIDER_SITE_OTHER): Payer: Medicare Other | Admitting: Family Medicine

## 2015-10-02 ENCOUNTER — Encounter: Payer: Self-pay | Admitting: Family Medicine

## 2015-10-02 ENCOUNTER — Telehealth: Payer: Self-pay | Admitting: Family Medicine

## 2015-10-02 VITALS — BP 124/62 | HR 76 | Temp 97.7°F | Wt 181.0 lb

## 2015-10-02 DIAGNOSIS — I2510 Atherosclerotic heart disease of native coronary artery without angina pectoris: Secondary | ICD-10-CM

## 2015-10-02 DIAGNOSIS — E785 Hyperlipidemia, unspecified: Secondary | ICD-10-CM | POA: Diagnosis not present

## 2015-10-02 DIAGNOSIS — I10 Essential (primary) hypertension: Secondary | ICD-10-CM | POA: Diagnosis not present

## 2015-10-02 DIAGNOSIS — G459 Transient cerebral ischemic attack, unspecified: Secondary | ICD-10-CM | POA: Diagnosis not present

## 2015-10-02 DIAGNOSIS — R634 Abnormal weight loss: Secondary | ICD-10-CM | POA: Insufficient documentation

## 2015-10-02 DIAGNOSIS — E1159 Type 2 diabetes mellitus with other circulatory complications: Secondary | ICD-10-CM

## 2015-10-02 NOTE — Addendum Note (Signed)
Addended by: Royann Shivers A on: 10/02/2015 12:33 PM   Modules accepted: Orders

## 2015-10-02 NOTE — Progress Notes (Signed)
Pre visit review using our clinic review tool, if applicable. No additional management support is needed unless otherwise documented below in the visit note. 

## 2015-10-02 NOTE — Assessment & Plan Note (Signed)
Pt endorses 12lb unexpected weight loss over last 6 months. States UTD colon cancer screening through [place]. Lungs clear today. Recent labwork reassuring. Will continue to monitor sxs and if persists, will return for further evaluation.

## 2015-10-02 NOTE — Patient Instructions (Addendum)
Try lower gabapentin dose - 2 tablets in the morning and 3 at night - to see if dizziness improves. Pass by our referral coordinators to schedule heart and carotid ultrasounds over next few weeks. Let's watch weight and let me know if persistent drop unexpectedly.  Make sure to stay well hydrated, try to increase water by 1-2 cups a day.

## 2015-10-02 NOTE — Progress Notes (Addendum)
BP 124/62   Pulse 76   Temp 97.7 F (36.5 C) (Oral)   Wt 181 lb (82.1 kg)   BMI 27.52 kg/m    CC: ER f/u visit Subjective:    Patient ID: William Walton, male    DOB: [birth year], 74 y.o.   MRN: [id number]  HPI: William Walton is a 75 y.o. male presenting on 10/02/2015 for Follow-up (ER)   Recent ER evaluation as code stroke for double vision, presyncope. Workup included normal MRI/MRA head and neck as well as normal labs. MRI/MRA showed minimal chronic small vessel ischemic disease. Cr 1.21, glu 117. EKG was stable. Pt declined hospitalization, agreed to close outpatient f/u.   Staying lightheaded described as "dull feeling like I'm in a fog". Lightheadedness and presyncope with some orthostasis. No vertigo, palpitations, or headaches. Yesterday felt better - even painted room in his house. Today when he woke up again felt lightheadedness. Denies recent hearing changes or vomiting. Chronic tinnitus from known hearing loss.   rec carotid dopplers and echocardiogram as outpatient.   Lab Results  Component Value Date   HGBA1C 7.1 08/15/2015     [facility] H/o agent orange exposure.  Back pain/leg pain s/p steroid shots last week for bulging discs - sees Dr Alvan Dame in [place].  10lb weight loss over last 6 months, without trying.   Relevant past medical, surgical, family and social history reviewed and updated as indicated. Interim medical history since our last visit reviewed. Allergies and medications reviewed and updated. Current Outpatient Prescriptions on File Prior to Visit  Medication Sig  . amLODipine (NORVASC) 5 MG tablet Take 5 mg by mouth daily.  Marland Kitchen aspirin 81 MG tablet Take 81 mg by mouth every morning.   Marland Kitchen FREESTYLE LITE test strip USE TO TEST BLOOD SUGAR TWICE A DAY AS INSTRUCTED  . gabapentin (NEURONTIN) 300 MG capsule TAKE 3 CAPSULES TWICE A DAY  . GLIPIZIDE XL 5 MG 24 hr tablet TAKE 1 TABLET IN THE MORNING AND 2 TABLETS IN THE EVENING  . INVOKANA 300 MG  TABS tablet TAKE 1 TABLET EVERY MORNING  . JANUMET XR 50-1000 MG TB24 TAKE 1 TABLET TWICE A DAY (Patient taking differently: 2am)  . L-Methylfolate-B6-B12 (FOLTANX) 3-35-2 MG TABS TAKE 1 TABLET TWICE A DAY  . prazosin (MINIPRESS) 1 MG capsule Take 1 mg by mouth at bedtime.  . rosuvastatin (CRESTOR) 20 MG tablet Take 20 mg by mouth daily.  Marland Kitchen acetaminophen (TYLENOL) 325 MG tablet Take 650 mg by mouth every 6 (six) hours as needed. Reported on 08/15/2015  . amoxicillin (AMOXIL) 500 MG capsule Take 2,000 mg by mouth daily. Take one hour prior to dental treatment   No current facility-administered medications on file prior to visit.     Review of Systems Per HPI unless specifically indicated in ROS section     Objective:    BP 124/62   Pulse 76   Temp 97.7 F (36.5 C) (Oral)   Wt 181 lb (82.1 kg)   BMI 27.52 kg/m   Wt Readings from Last 3 Encounters:  10/02/15 181 lb (82.1 kg)  09/30/15 175 lb (79.4 kg)  08/15/15 186 lb (84.4 kg)    Physical Exam  Constitutional: He is oriented to person, place, and time. He appears well-developed and well-nourished. No distress.  HENT:  Head: Normocephalic and atraumatic.  Mouth/Throat: Oropharynx is clear and moist. No oropharyngeal exudate.  Eyes: Conjunctivae and EOM are normal. Pupils are equal, round, and  reactive to light. No scleral icterus.  Neck: Normal range of motion. Neck supple. Carotid bruit is not present.  Cardiovascular: Normal rate, regular rhythm, normal heart sounds and intact distal pulses.   No murmur heard. Pulmonary/Chest: Breath sounds normal. No respiratory distress. He has no wheezes. He has no rales.  Abdominal: Soft. There is no tenderness.  No abd/renal bruits  Neurological: He is alert and oriented to person, place, and time. He has normal strength. No cranial nerve deficit or sensory deficit. He displays a negative Romberg sign. Coordination and gait normal.  CN 2-12 intact FTN intact No pronator drift  Skin:  Skin is warm and dry. No rash noted.  Vitals reviewed.  Results for orders placed or performed during the hospital encounter of 09/30/15  Protime-INR  Result Value Ref Range   Prothrombin Time 14.2 11.4 - 15.2 seconds   INR 1.10   APTT  Result Value Ref Range   aPTT 30 24 - 36 seconds  CBC  Result Value Ref Range   WBC 11.3 (H) 3.8 - 10.6 K/uL   RBC 5.23 4.40 - 5.90 MIL/uL   Hemoglobin 14.9 13.0 - 18.0 g/dL   HCT 44.3 40.0 - 52.0 %   MCV 84.7 80.0 - 100.0 fL   MCH 28.5 26.0 - 34.0 pg   MCHC 33.7 32.0 - 36.0 g/dL   RDW 15.7 (H) 11.5 - 14.5 %   Platelets 231 150 - 440 K/uL  Differential  Result Value Ref Range   Neutrophils Relative % 69 %   Neutro Abs 7.8 (H) 1.4 - 6.5 K/uL   Lymphocytes Relative 20 %   Lymphs Abs 2.3 1.0 - 3.6 K/uL   Monocytes Relative 8 %   Monocytes Absolute 0.9 0.2 - 1.0 K/uL   Eosinophils Relative 2 %   Eosinophils Absolute 0.2 0 - 0.7 K/uL   Basophils Relative 1 %   Basophils Absolute 0.1 0 - 0.1 K/uL  Comprehensive metabolic panel  Result Value Ref Range   Sodium 136 135 - 145 mmol/L   Potassium 4.3 3.5 - 5.1 mmol/L   Chloride 107 101 - 111 mmol/L   CO2 21 (L) 22 - 32 mmol/L   Glucose, Bld 117 (H) 65 - 99 mg/dL   BUN 22 (H) 6 - 20 mg/dL   Creatinine, Ser 1.21 0.61 - 1.24 mg/dL   Calcium 8.8 (L) 8.9 - 10.3 mg/dL   Total Protein 7.4 6.5 - 8.1 g/dL   Albumin 4.2 3.5 - 5.0 g/dL   AST 20 15 - 41 U/L   ALT 17 17 - 63 U/L   Alkaline Phosphatase 40 38 - 126 U/L   Total Bilirubin 0.8 0.3 - 1.2 mg/dL   GFR calc non Af Amer 58 (L) >60 mL/min   GFR calc Af Amer >60 >60 mL/min   Anion gap 8 5 - 15  Troponin I  Result Value Ref Range   Troponin I <0.03 <0.03 ng/mL   Lab Results  Component Value Date   HGBA1C 7.1 08/15/2015       Assessment & Plan:   Problem List Items Addressed This Visit    CAD (coronary artery disease)   Relevant Medications   metoprolol tartrate (LOPRESSOR) 25 MG tablet   HLD (hyperlipidemia)   Relevant Medications    metoprolol tartrate (LOPRESSOR) 25 MG tablet   HTN (hypertension)   Relevant Medications   metoprolol tartrate (LOPRESSOR) 25 MG tablet   TIA (transient ischemic attack) - Primary    Recent  ER visit for presumed TIA with peripheral double vision and presyncope with unrevealing evaluation at that time (MRI/MRA brain, labs and EKG). Records reviewed.  Will complete stroke workup with echocardiogram and carotid ultrasound.  Several cardiovascular risk factors present, but overall well controlled.       Relevant Medications   metoprolol tartrate (LOPRESSOR) 25 MG tablet   Other Relevant Orders   ECHOCARDIOGRAM COMPLETE   VAS US CAROTID   Type 2 diabetes mellitus with other circulatory complications (HCC)   Weight loss    Pt endorses 12lb unexpected weight loss over last 6 months. States UTD colon cancer screening through [place]. Lungs clear today. Recent labwork reassuring. Will continue to monitor sxs and if persists, will return for further evaluation.        Other Visit Diagnoses   None.      Follow up plan: Return in about 6 months (around 04/03/2016) for medicare wellness visit.  Ria Bush, MD

## 2015-10-02 NOTE — Telephone Encounter (Signed)
Dr. Darnell Level,  I had Maudie Mercury put the orders in for the Echo and Carotid for Mr. Cluster. He is scheduled for Sept 1st.  I need the order for the carotid u/s changed to VAS [id number]. Please advise and thank you

## 2015-10-02 NOTE — Addendum Note (Signed)
Addended by: Ria Bush on: 10/02/2015 12:51 PM   Modules accepted: Orders

## 2015-10-02 NOTE — Telephone Encounter (Signed)
Order changed.  Thank you.

## 2015-10-02 NOTE — Assessment & Plan Note (Signed)
Recent ER visit for presumed TIA with peripheral double vision and presyncope with unrevealing evaluation at that time (MRI/MRA brain, labs and EKG). Records reviewed.  Will complete stroke workup with echocardiogram and carotid ultrasound.  Several cardiovascular risk factors present, but overall well controlled.

## 2015-10-03 ENCOUNTER — Other Ambulatory Visit: Payer: Self-pay | Admitting: Family Medicine

## 2015-10-08 ENCOUNTER — Other Ambulatory Visit: Payer: Self-pay | Admitting: Internal Medicine

## 2015-10-09 DIAGNOSIS — M5416 Radiculopathy, lumbar region: Secondary | ICD-10-CM | POA: Diagnosis not present

## 2015-10-09 DIAGNOSIS — M4806 Spinal stenosis, lumbar region: Secondary | ICD-10-CM | POA: Diagnosis not present

## 2015-10-09 DIAGNOSIS — S7001XD Contusion of right hip, subsequent encounter: Secondary | ICD-10-CM | POA: Diagnosis not present

## 2015-10-09 DIAGNOSIS — M5136 Other intervertebral disc degeneration, lumbar region: Secondary | ICD-10-CM | POA: Diagnosis not present

## 2015-10-11 ENCOUNTER — Telehealth: Payer: Self-pay | Admitting: Family Medicine

## 2015-10-11 NOTE — Telephone Encounter (Signed)
Called pt - pt will call back on Monday to possible schedule awv

## 2015-10-18 ENCOUNTER — Other Ambulatory Visit: Payer: Self-pay

## 2015-10-18 ENCOUNTER — Ambulatory Visit: Payer: Medicare Other

## 2015-10-18 ENCOUNTER — Ambulatory Visit (INDEPENDENT_AMBULATORY_CARE_PROVIDER_SITE_OTHER): Payer: Medicare Other

## 2015-10-18 DIAGNOSIS — G459 Transient cerebral ischemic attack, unspecified: Secondary | ICD-10-CM | POA: Diagnosis not present

## 2015-10-19 ENCOUNTER — Encounter: Payer: Self-pay | Admitting: Family Medicine

## 2015-10-19 DIAGNOSIS — I779 Disorder of arteries and arterioles, unspecified: Secondary | ICD-10-CM

## 2015-10-19 DIAGNOSIS — I739 Peripheral vascular disease, unspecified: Secondary | ICD-10-CM

## 2015-10-19 HISTORY — DX: Disorder of arteries and arterioles, unspecified: I77.9

## 2015-10-20 ENCOUNTER — Other Ambulatory Visit: Payer: Self-pay | Admitting: Internal Medicine

## 2015-10-21 DIAGNOSIS — L03818 Cellulitis of other sites: Secondary | ICD-10-CM | POA: Diagnosis not present

## 2015-10-25 DIAGNOSIS — I25719 Atherosclerosis of autologous vein coronary artery bypass graft(s) with unspecified angina pectoris: Secondary | ICD-10-CM | POA: Diagnosis not present

## 2015-10-25 DIAGNOSIS — R42 Dizziness and giddiness: Secondary | ICD-10-CM | POA: Diagnosis not present

## 2015-10-25 DIAGNOSIS — M1990 Unspecified osteoarthritis, unspecified site: Secondary | ICD-10-CM | POA: Diagnosis not present

## 2015-10-25 DIAGNOSIS — E119 Type 2 diabetes mellitus without complications: Secondary | ICD-10-CM | POA: Diagnosis not present

## 2015-10-25 DIAGNOSIS — I209 Angina pectoris, unspecified: Secondary | ICD-10-CM | POA: Diagnosis not present

## 2015-10-25 DIAGNOSIS — I2510 Atherosclerotic heart disease of native coronary artery without angina pectoris: Secondary | ICD-10-CM | POA: Diagnosis not present

## 2015-10-25 DIAGNOSIS — E784 Other hyperlipidemia: Secondary | ICD-10-CM | POA: Diagnosis not present

## 2015-10-25 DIAGNOSIS — I10 Essential (primary) hypertension: Secondary | ICD-10-CM | POA: Diagnosis not present

## 2015-10-25 DIAGNOSIS — E669 Obesity, unspecified: Secondary | ICD-10-CM | POA: Diagnosis not present

## 2015-10-25 DIAGNOSIS — R5383 Other fatigue: Secondary | ICD-10-CM | POA: Diagnosis not present

## 2015-11-15 ENCOUNTER — Ambulatory Visit (INDEPENDENT_AMBULATORY_CARE_PROVIDER_SITE_OTHER): Payer: Medicare Other | Admitting: Internal Medicine

## 2015-11-15 ENCOUNTER — Encounter: Payer: Self-pay | Admitting: Internal Medicine

## 2015-11-15 VITALS — BP 123/69 | HR 57 | Ht 68.0 in | Wt 175.0 lb

## 2015-11-15 DIAGNOSIS — Z23 Encounter for immunization: Secondary | ICD-10-CM | POA: Diagnosis not present

## 2015-11-15 DIAGNOSIS — E1159 Type 2 diabetes mellitus with other circulatory complications: Secondary | ICD-10-CM | POA: Diagnosis not present

## 2015-11-15 DIAGNOSIS — I2510 Atherosclerotic heart disease of native coronary artery without angina pectoris: Secondary | ICD-10-CM | POA: Diagnosis not present

## 2015-11-15 LAB — POCT GLYCOSYLATED HEMOGLOBIN (HGB A1C): Hemoglobin A1C: 6.4

## 2015-11-15 NOTE — Patient Instructions (Addendum)
Please continue: - Janumet XR 1000-50 mg 2 tablets in am - Glipizide ER 5 mg in am and 10 mg in pm - Invokana 300 mg in am   Continue working on your diet and exercise.  Please return in 4 months with your sugar log.

## 2015-11-15 NOTE — Addendum Note (Signed)
Addended by: Nile Riggs on: 11/15/2015 08:33 AM   Modules accepted: Orders

## 2015-11-15 NOTE — Progress Notes (Signed)
Patient ID: William Walton, male   DOB: 05-30-1940, 75 y.o.   MRN: [id number]  HPI: William Walton is a 75 y.o.-year-old male, returning for f/u for DM2, dx 2010, non-insulin-dependent, uncontrolled, with complications (CAD - s/p CABG, peripheral neuropathy, ED), likely 2/2 Agent Orange. Also goes to the [place] in [place] now (previosusly was going to [place]). Last visit with me was 3 mo ago. PCP: [name].  Before last visit, he started exercising more and reduced portions/changed diet. He had lost 7 lbs before last visit and 11 lbs since last visit!   HbA1C levels: Lab Results  Component Value Date   HGBA1C 7.1 08/15/2015   HGBA1C 7.5 05/08/2015   HGBA1C 7.0 02/06/2015  02/20/2011: A1c 7.1% 05/22/2011: A1c 6.8%, after which he was taken off the insulin >> A1C started to increase.  11/19/2011: A1c 8.5%, TSH 2.18, BUN/creatinine 16/1.0, ACR 3.7 02/24/2012: A1c 8.4%, vitamin B12 321 06/01/2012: 7.6% 01/26/2013: 7.3% - received records from [place]   Pt is on a regimen of: - Janumet XR 1000-50 mg x 2 a day - Glipizide ER 5 mg in am and 10 mg in pm - Invokana 300 mg daily  Pt checks his sugars 2x a day >> reviewed detailed log: - am:  93, 106-134, 155, 180 >> 107-154, 188 >> 84-127, 132 >> 86, 91-129, 145 - 2h after b'fast: 113 >> n/c >> 136 >> 166 >> 162 - before lunch: 95-134 >> 140 >> 80, 139, 149 >> n/c >> 102 >> n/c - 2h after lunch: 80 >> n/c >> 78 >> n/c >> 103 >> 107-184 - before dinner:  176-191, 226 >> 106-141, 151 >> 119 >> 117, 144 >> 94-137, 146 - 2h after dinner: 100-148, 180, 198 >> 113-189 >> 104-181 >> 114-189, 206  >> 135-189, 239 >> 108-188 - bedtime: 94-198 (263) >> 118-174 >> 115-159 >> 151-185, 220 >> 148, 152 >> 159 >> 119, 121 - at 11 pm: 90 >> 90, 118, 181 >> 167 >> n/c >> 101 No lows. Lowest sugar was 77 >> 90s >> 93 >> 107 >> 86; he has hypoglycemia awareness at 70.  Highest sugar was 213 >> 200s >> 274 x1 day trip - forgot meds at  home) >> 206 >> 188  He exercises by going to the gym 3x a week.  - has mild CKD. Last BUN/creatinine:  Lab Results  Component Value Date   BUN 22 (H) 09/30/2015   BUN 15 05/08/2015   CREATININE 1.21 09/30/2015   CREATININE 1.04 05/08/2015  19/1.2 (GFR 63.4) in 01/26/2013  16/1.0 in 05/2012.  He is on Lisinopril 2.5.   - has HL. Last set of lipids:  Lab Results  Component Value Date   CHOL 131 05/08/2015   HDL 28.90 (L) 05/08/2015   LDLCALC 63 05/08/2015   LDLDIRECT 86.0 03/23/2014   TRIG 193.0 (H) 05/08/2015   CHOLHDL 5 05/08/2015  212/249/29/106 (01/26/2013)  200/214/26.8/130.4  He switched from Lipitor 40 to Crestor 20 mg daily after the previous results. He continues Crestor.  He is on ASA 81.  - last eye exam was:  Abstract on 08/07/2015  Component Date Value Ref Range Status  . HM Diabetic Eye Exam- [name].  07/31/2015 No Retinopathy  No Retinopathy Final   - + numbness and tingling in his feet. He is on Neurontin 300 mg, now 3x a day, restarted Metanx - generic after last visit.  He also has a history of PTSD (  [place] veteran), HTN, HL, h/o L TKR.  I reviewed pt's medications, allergies, PMH, social hx, family hx, and changes were documented in the history of present illness. Otherwise, unchanged from my initial visit note.  ROS: Constitutional: no weight gain/loss, no fatigue, no subjective hyperthermia/hypothermia, no  poor sleep, no nocturia Eyes: no blurry vision, no xerophthalmia ENT: no sore throat, no nodules palpated in throat, no dysphagia/odynophagia, no hoarseness Cardiovascular: no CP/SOB/palpitations/leg swelling Respiratory: no cough/SOB Gastrointestinal: no N/V/D/C Musculoskeletal: no muscle/joint aches Skin: no rash Neurological: no tremors/numbness/tingling/dizziness  PE: BP 123/69   Pulse (!) 57   Ht 5\' 8"  (1.727 m)   Wt 175 lb (79.4 kg)   BMI 26.61 kg/m  Body mass index is 26.61 kg/m.  Wt Readings from Last 3 Encounters:   11/15/15 175 lb (79.4 kg)  10/02/15 181 lb (82.1 kg)  09/30/15 175 lb (79.4 kg)   Constitutional: overweight, in NAD Eyes: PERRLA, EOMI, no exophthalmos ENT: moist mucous membranes, no thyromegaly, no cervical lymphadenopathy Cardiovascular: RRR, No MRG Respiratory: CTA B Gastrointestinal: abdomen soft, NT, ND, BS+ Musculoskeletal: no deformities, strength intact in all 4 Skin: moist, warm, no rashes  ASSESSMENT: 1. DM2, non-insulin-dependent, uncontrolled, with complications - CAD, s/p AMI and 5v CABG at [facility] 08/29/2010 - Peripheral neuropathy - ED  2. PN  PLAN:  1. Patient with long-standing, fairly well controlled diabetes, with better sugars at last visit due to improved diet. He also lost 7 lbs and his HbA1c was better: 7.1%. Since then, he lost another 11 lbs! Sugars are great! - HbA1c today >> 6.4% (lower).  - a good HbA1c target for him of ~7.5% due to age.  - I suggested to continue current regimen:  Patient Instructions  Please continue: - Janumet XR 1000-50 mg 2 tablets in am - Glipizide ER 5 mg in am and 10 mg in pm - Invokana 300 mg in am   Continue working on your diet and exercise.  Please return in 4 months with your sugar log.   - continue check 2 times a day, rotating checks - up to date with eye exams - will give flu shot today - Return to clinic in 4 months with sugar log   2. Peripheral neuropathy - on Neurontin 300 mg tid - restarted generic Metanx at last visit - no recent exacerbations  [name], MD PhD [facility]

## 2015-11-15 NOTE — Addendum Note (Signed)
Addended by: Nile Riggs on: 11/15/2015 08:38 AM   Modules accepted: Orders

## 2015-11-21 ENCOUNTER — Emergency Department: Payer: Medicare Other

## 2015-11-21 ENCOUNTER — Encounter: Payer: Self-pay | Admitting: Emergency Medicine

## 2015-11-21 ENCOUNTER — Emergency Department
Admission: EM | Admit: 2015-11-21 | Discharge: 2015-11-22 | Disposition: A | Discharge status: Home / Self Care | Payer: Medicare Other | Attending: Emergency Medicine | Admitting: Emergency Medicine

## 2015-11-21 DIAGNOSIS — I959 Hypotension, unspecified: Secondary | ICD-10-CM | POA: Diagnosis not present

## 2015-11-21 DIAGNOSIS — I2510 Atherosclerotic heart disease of native coronary artery without angina pectoris: Secondary | ICD-10-CM | POA: Diagnosis not present

## 2015-11-21 DIAGNOSIS — I10 Essential (primary) hypertension: Secondary | ICD-10-CM | POA: Diagnosis not present

## 2015-11-21 DIAGNOSIS — Z7984 Long term (current) use of oral hypoglycemic drugs: Secondary | ICD-10-CM | POA: Diagnosis not present

## 2015-11-21 DIAGNOSIS — Z79899 Other long term (current) drug therapy: Secondary | ICD-10-CM | POA: Diagnosis not present

## 2015-11-21 DIAGNOSIS — E119 Type 2 diabetes mellitus without complications: Secondary | ICD-10-CM | POA: Diagnosis not present

## 2015-11-21 DIAGNOSIS — Z87891 Personal history of nicotine dependence: Secondary | ICD-10-CM | POA: Insufficient documentation

## 2015-11-21 DIAGNOSIS — R55 Syncope and collapse: Secondary | ICD-10-CM

## 2015-11-21 DIAGNOSIS — Z7982 Long term (current) use of aspirin: Secondary | ICD-10-CM | POA: Diagnosis not present

## 2015-11-21 DIAGNOSIS — Z951 Presence of aortocoronary bypass graft: Secondary | ICD-10-CM | POA: Insufficient documentation

## 2015-11-21 MED ORDER — SODIUM CHLORIDE 0.9 % IV BOLUS (SEPSIS)
1000.0000 mL | Freq: Once | INTRAVENOUS | Status: AC
  Administered 2015-11-22: 1000 mL via INTRAVENOUS

## 2015-11-21 NOTE — ED Triage Notes (Signed)
Pt arrived to the ED accompanied by his wife after having a syncopal episode. Pt reports that he stood up to go to the bathroom and passed out. Pt reports that her loss consciousness and that his BP at home was 69/46. Pt is AOx4 during triage, dizzy and blurry vision.

## 2015-11-21 NOTE — ED Provider Notes (Signed)
[facility] Emergency Department Provider Note   ____________________________________________   First MD Initiated Contact with Patient 11/21/15 2336     (approximate)  I have reviewed the triage vital signs and the nursing notes.   HISTORY  Chief Complaint Loss of Consciousness    HPI William Walton is a 75 y.o. male who presents to the ED from home with a chief complaint of syncope. Patient reports watching a ballgame, stood up to use the restroom, became lightheaded and passed out. Spouse reports finding patient on the floor awake. He took his blood pressure at home which was 69/46. Blood pressure at triage 76/49. Denies recent fever, chills, chest pain, shortness of breath, abdominal pain, nausea, vomiting, diarrhea. Reports taking his beta blocker in the morning and evening, usually approximately 6 PM. Denies unusual schedule or later administration of beta blocker tonight. Denies recent travel or trauma. Nothing makes his symptoms better or worse.   Past Medical History:  Diagnosis Date  . Arthritis   . CAD (coronary artery disease) 08/2010   STEMI 08/2010 s/p 5v CABG at [facility], cardiac rehab - only did 7/26 sessions  . Diabetes mellitus type II 08/2010   followed by Dr. Gabriel Carina [facility]), no retinopathy Ellin Mayhew)  . ED (erectile dysfunction)    likely medication induced  . HLD (hyperlipidemia)   . HTN (hypertension)   . Hypertension   . Mild carotid artery disease ([place]) 10/19/2015   Mild plaque build-up by [place] 10/2015, monitor clinically   . Spondyloarthropathy ([place]) 05/2014   by Xray  . Type I (juvenile type) diabetes mellitus with peripheral circulatory disorders, uncontrolled(250.73)     Patient Active Problem List   Diagnosis Date Noted  . Mild carotid artery disease ([place]) 10/19/2015  . TIA (transient ischemic attack) 10/02/2015  . Weight loss 10/02/2015  . Injury of right leg 06/14/2015  . Primary osteoarthritis of both knees 05/18/2014  .  Spondyloarthropathy ([place]) 05/18/2014  . Low back pain 05/10/2014  . Type 2 diabetes mellitus with other circulatory complications 123456  . Medicare annual wellness visit, initial 10/21/2010  . ED (erectile dysfunction) 10/21/2010  . HTN (hypertension)   . HLD (hyperlipidemia)   . CAD (coronary artery disease)     Past Surgical History:  Procedure Laterality Date  . CATARACT EXTRACTION  11/2010  . CORONARY ARTERY BYPASS GRAFT  08/2010   5v, [facility] Dr. Julaine Hua  . ESOPHAGOGASTRODUODENOSCOPY  08/2010   normal esophagus, localized gastric erythema  . TOTAL KNEE ARTHROPLASTY Left 08/2012   [facility] at [place]  . US ECHOCARDIOGRAPHY  08/2010   mild LV dysfxn (EF 45%) with severe LVH, nl R vent sys fxn    Prior to Admission medications   Medication Sig Start Date End Date Taking? Authorizing Provider  acetaminophen (TYLENOL) 325 MG tablet Take 650 mg by mouth every 6 (six) hours as needed. Reported on 08/15/2015   Yes Historical Provider, MD  aspirin 81 MG tablet Take 81 mg by mouth every morning.    Yes Historical Provider, MD  gabapentin (NEURONTIN) 300 MG capsule Take 900 mg by mouth 3 (three) times daily.   Yes Historical Provider, MD  GLIPIZIDE XL 5 MG 24 hr tablet TAKE 1 TABLET IN THE MORNING AND 2 TABLETS IN THE EVENING 10/22/15  Yes [name], MD  INVOKANA 300 MG TABS tablet TAKE 1 TABLET EVERY MORNING 06/21/15  Yes [name], MD  L-Methylfolate-B6-B12 (FOLTANX) 3-35-2 MG TABS TAKE 1 TABLET TWICE A DAY 03/27/14  Yes Salena Saner  Cruzita Lederer, MD  meclizine (ANTIVERT) 25 MG tablet Take 25 mg by mouth 3 (three) times daily as needed for dizziness.    Yes Historical Provider, MD  metoprolol tartrate (LOPRESSOR) 25 MG tablet Take 25 mg by mouth 2 (two) times daily.   Yes Historical Provider, MD  prazosin (MINIPRESS) 1 MG capsule Take 1 mg by mouth at bedtime.   Yes Historical Provider, MD  ramipril (ALTACE) 5 MG capsule Take 5 mg by mouth daily.   Yes Historical Provider, MD  rosuvastatin  (CRESTOR) 20 MG tablet Take 20 mg by mouth daily.   Yes Historical Provider, MD  sertraline (ZOLOFT) 100 MG tablet Take 100 mg by mouth daily.   Yes Historical Provider, MD  sitaGLIPtin-metformin (JANUMET) 50-1000 MG tablet Take 2 tablets by mouth every morning.   Yes Historical Provider, MD  amoxicillin (AMOXIL) 500 MG capsule Take 2,000 mg by mouth daily. Take one hour prior to dental treatment    Historical Provider, MD  FREESTYLE LITE test strip USE TO TEST BLOOD SUGAR TWICE A DAY AS INSTRUCTED 08/05/15   [name], MD    Allergies Review of patient's allergies indicates no known allergies.  Family History  Problem Relation Age of Onset  . Cancer Father 29    died of prostate or bladder CA, pt unsure  . Coronary artery disease Brother 59    bypass  . Hyperlipidemia Brother   . Hypertension Brother   . Coronary artery disease Maternal Uncle 59    MI  . Diabetes Neg Hx   . Stroke Neg Hx     Social History Social History  Substance Use Topics  . Smoking status: Former Smoker    Quit date: 02/16/1966  . Smokeless tobacco: Never Used  . Alcohol use No    Review of Systems  Constitutional: No fever/chills. Eyes: No visual changes. ENT: No sore throat. Cardiovascular: Denies chest pain. Respiratory: Denies shortness of breath. Gastrointestinal: No abdominal pain.  No nausea, no vomiting.  No diarrhea.  No constipation. Genitourinary: Negative for dysuria. Musculoskeletal: Negative for back pain. Skin: Negative for rash. Neurological: Positive for lightheadedness and syncope. Negative for headaches, focal weakness or numbness.  10-point ROS otherwise negative.  ____________________________________________   PHYSICAL EXAM:  VITAL SIGNS: ED Triage Vitals  Enc Vitals Group     BP 11/21/15 2319 (!) 76/49     Pulse Rate 11/21/15 2319 72     Resp 11/21/15 2319 18     Temp 11/21/15 2319 98 F (36.7 C)     Temp Source 11/21/15 2319 Oral     SpO2 11/21/15 2319 93  %     Weight 11/21/15 2320 171 lb (77.6 kg)     Height 11/21/15 2320 5\' 8"  (1.727 m)     Head Circumference --      Peak Flow --      Pain Score 11/21/15 2320 7     Pain Loc --      Pain Edu? --      Excl. in [place]? --     Constitutional: Alert and oriented. Well appearing and in no acute distress. Eyes: Conjunctivae are normal. PERRL. EOMI. Head: Atraumatic. Nose: No congestion/rhinnorhea. Mouth/Throat: Mucous membranes are moist.  Oropharynx non-erythematous. Neck: No stridor.  No cervical spine tenderness to palpation.  No carotid bruits. Cardiovascular: Normal rate, regular rhythm. Grossly normal heart sounds.  Good peripheral circulation. 2+ femoral and distal pulses bilaterally. Respiratory: Normal respiratory effort.  No retractions. Lungs CTAB. Gastrointestinal: Soft and  nontender to light or deep palpation. No distention. No abdominal bruits. No CVA tenderness. Musculoskeletal: No lower extremity tenderness nor edema.  No joint effusions. Neurologic:  Normal speech and language. No gross focal neurologic deficits are appreciated. Skin:  Skin is warm, dry and intact. No rash noted. Psychiatric: Mood and affect are normal. Speech and behavior are normal.  ____________________________________________   LABS (all labs ordered are listed, but only abnormal results are displayed)  Labs Reviewed  CBC WITH DIFFERENTIAL/PLATELET - Abnormal; Notable for the following:       Result Value   WBC 11.5 (*)    RDW 15.3 (*)    Neutro Abs 7.1 (*)    All other components within normal limits  COMPREHENSIVE METABOLIC PANEL - Abnormal; Notable for the following:    Glucose, Bld 196 (*)    Creatinine, Ser 1.32 (*)    GFR calc non Af Amer 51 (*)    GFR calc Af Amer 60 (*)    All other components within normal limits  URINALYSIS COMPLETEWITH MICROSCOPIC ([facility] ONLY) - Abnormal; Notable for the following:    Color, Urine YELLOW (*)    APPearance CLEAR (*)    Glucose, UA >500 (*)    All  other components within normal limits  TROPONIN I  PROTIME-INR  TYPE AND SCREEN   ____________________________________________  EKG  ED ECG REPORT I, SUNG,JADE J, the attending physician, personally viewed and interpreted this ECG.   Date: 11/21/2015  EKG Time: 2338  Rate: 68  Rhythm: normal EKG, normal sinus rhythm  Axis: Normal  Intervals:none  ST&T Change: Nonspecific  ____________________________________________  RADIOLOGY  CT head interpreted per Dr. Gerilyn Nestle: No acute intracranial abnormalities.  Portable chest x-ray (viewed by me, interpreted per Dr. Dorann Lodge): No acute cardiopulmonary process. ____________________________________________   PROCEDURES  Procedure(s) performed: None  Procedures  Critical Care performed: No  ____________________________________________   INITIAL IMPRESSION / ASSESSMENT AND PLAN / ED COURSE  Pertinent labs & imaging results that were available during my care of the patient were reviewed by me and considered in my medical decision making (see chart for details).  75 year old male with a history of CAD, hypertension, diabetes who presents as post syncopal episode. Without intervention, systolic blood pressure is currently 102. Will obtain orthostatic vital signs, screening lab work and reassess.  Clinical Course  Comment By Time  Patient states he is feeling much better and is eager for discharge home. Updated patient and spouse of laboratory imaging results. Currently his room air saturation is 96%. Will repeat orthostatic vital signs and discharge home if they are improved and he is asymptomatic. Paulette Blanch, MD 10/06 0200  Orthostatics within normal limits. Patient was not dizzy when he stood up. Abdomen remains nontender without bruits. We discussed holding his morning dose of metoprolol and for him to check his blood pressure prior to his evening dose tonight. Also asked patient to call his PCP today once the office is open.  Strict return precautions given. Patient and spouse verbalize understanding and agree with plan of care. Paulette Blanch, MD 10/06 0225     ____________________________________________   FINAL CLINICAL IMPRESSION(S) / ED DIAGNOSES  Final diagnoses:  Syncope  Syncope, unspecified syncope type  Hypotension, unspecified hypotension type      NEW MEDICATIONS STARTED DURING THIS VISIT:  Discharge Medication List as of 11/22/2015  2:27 AM       Note:  This document was prepared using Dragon voice recognition software and may include unintentional  dictation errors.    Paulette Blanch, MD 11/22/15 [phone]

## 2015-11-22 ENCOUNTER — Emergency Department: Payer: Medicare Other

## 2015-11-22 ENCOUNTER — Encounter: Payer: Self-pay | Admitting: Family Medicine

## 2015-11-22 ENCOUNTER — Telehealth: Payer: Self-pay | Admitting: Family Medicine

## 2015-11-22 ENCOUNTER — Ambulatory Visit (INDEPENDENT_AMBULATORY_CARE_PROVIDER_SITE_OTHER): Payer: Medicare Other | Admitting: Family Medicine

## 2015-11-22 VITALS — BP 108/58 | HR 62 | Wt 187.0 lb

## 2015-11-22 DIAGNOSIS — M25551 Pain in right hip: Secondary | ICD-10-CM | POA: Diagnosis not present

## 2015-11-22 DIAGNOSIS — R55 Syncope and collapse: Secondary | ICD-10-CM | POA: Diagnosis not present

## 2015-11-22 DIAGNOSIS — W19XXXD Unspecified fall, subsequent encounter: Secondary | ICD-10-CM

## 2015-11-22 DIAGNOSIS — M545 Low back pain, unspecified: Secondary | ICD-10-CM

## 2015-11-22 DIAGNOSIS — I959 Hypotension, unspecified: Secondary | ICD-10-CM | POA: Diagnosis not present

## 2015-11-22 DIAGNOSIS — I2510 Atherosclerotic heart disease of native coronary artery without angina pectoris: Secondary | ICD-10-CM | POA: Diagnosis not present

## 2015-11-22 LAB — COMPREHENSIVE METABOLIC PANEL
ALBUMIN: 4 g/dL (ref 3.5–5.0)
ALK PHOS: 51 U/L (ref 38–126)
ALT: 25 U/L (ref 17–63)
AST: 28 U/L (ref 15–41)
Anion gap: 9 (ref 5–15)
BILIRUBIN TOTAL: 0.6 mg/dL (ref 0.3–1.2)
BUN: 19 mg/dL (ref 6–20)
CALCIUM: 9 mg/dL (ref 8.9–10.3)
CO2: 25 mmol/L (ref 22–32)
Chloride: 102 mmol/L (ref 101–111)
Creatinine, Ser: 1.32 mg/dL — ABNORMAL HIGH (ref 0.61–1.24)
GFR calc Af Amer: 60 mL/min — ABNORMAL LOW (ref >60)
GFR calc non Af Amer: 51 mL/min — ABNORMAL LOW (ref >60)
GLUCOSE: 196 mg/dL — AB (ref 65–99)
Potassium: 4.1 mmol/L (ref 3.5–5.1)
Sodium: 136 mmol/L (ref 135–145)
TOTAL PROTEIN: 7.2 g/dL (ref 6.5–8.1)

## 2015-11-22 LAB — CBC WITH DIFFERENTIAL/PLATELET
BASOS ABS: 0.1 10*3/uL (ref 0–0.1)
BASOS PCT: 1 %
Eosinophils Absolute: 0.5 10*3/uL (ref 0–0.7)
Eosinophils Relative: 4 %
HEMATOCRIT: 43.8 % (ref 40.0–52.0)
HEMOGLOBIN: 15 g/dL (ref 13.0–18.0)
Lymphocytes Relative: 25 %
Lymphs Abs: 2.9 10*3/uL (ref 1.0–3.6)
MCH: 29 pg (ref 26.0–34.0)
MCHC: 34.2 g/dL (ref 32.0–36.0)
MCV: 84.8 fL (ref 80.0–100.0)
Monocytes Absolute: 0.9 10*3/uL (ref 0.2–1.0)
Monocytes Relative: 8 %
NEUTROS ABS: 7.1 10*3/uL — AB (ref 1.4–6.5)
NEUTROS PCT: 62 %
Platelets: 209 10*3/uL (ref 150–440)
RBC: 5.17 MIL/uL (ref 4.40–5.90)
RDW: 15.3 % — ABNORMAL HIGH (ref 11.5–14.5)
WBC: 11.5 10*3/uL — ABNORMAL HIGH (ref 3.8–10.6)

## 2015-11-22 LAB — URINALYSIS COMPLETE WITH MICROSCOPIC (ARMC ONLY)
BILIRUBIN URINE: NEGATIVE
Bacteria, UA: NONE SEEN
Glucose, UA: >500 mg/dL — AB
HGB URINE DIPSTICK: NEGATIVE
Ketones, ur: NEGATIVE mg/dL
LEUKOCYTES UA: NEGATIVE
Nitrite: NEGATIVE
PH: 5 (ref 5.0–8.0)
Protein, ur: NEGATIVE mg/dL
SQUAMOUS EPITHELIAL / LPF: NONE SEEN
Specific Gravity, Urine: 1.029 (ref 1.005–1.030)

## 2015-11-22 LAB — TYPE AND SCREEN
ABO/RH(D): O POS
Antibody Screen: NEGATIVE

## 2015-11-22 LAB — TROPONIN I: Troponin I: <0.03 ng/mL (ref <0.03)

## 2015-11-22 LAB — PROTIME-INR
INR: 0.99
Prothrombin Time: 13.1 seconds (ref 11.4–15.2)

## 2015-11-22 NOTE — Discharge Instructions (Signed)
1. Please hold your morning dose of metoprolol. Check your blood pressure before taking your evening dose. If your top number is 100 or below, do not take evening dose of metoprolol. 2. Return to the ER for worsening symptoms, persistent vomiting, difficulty breathing, feeling faint or other concerns.

## 2015-11-22 NOTE — Telephone Encounter (Signed)
Patient Name: William Walton  DOB: [birth year]    Initial Comment Caller says his BP dropped last night to 69/46 and he passed out. Was checked at the ER; they did not find anything wrong and said to check with his PCP. BP is 96/56. Feels fine, except his elbow and hip are sore. Acting normally; did hit his head    Nurse Assessment  Nurse: Raphael Gibney, RN, Vanita Ingles Date/Time (Eastern Time): 11/22/2015 11:13:14 AM  Confirm and document reason for call. If symptomatic, describe symptoms. You must click the next button to save text entered. ---Caller states spouse's BP dropped to 69/46. Passed out and fell. Hit his hip and elbow and hit his head CT scan of his head was ok. Blood work was ok. He takes BP medication. BP was 90/56. Not dizzy. Hip and elbow are sore.  Has the patient traveled out of the country within the last 30 days? ---Not Applicable  Does the patient have any new or worsening symptoms? ---Yes  Will a triage be completed? ---Yes  Related visit to physician within the last 2 weeks? ---Yes  Does the PT have any chronic conditions? (i.e. diabetes, asthma, etc.) ---Yes  List chronic conditions. ---HTN; diabetes; MI with CABG  Is this a behavioral health or substance abuse call? ---No     Guidelines    Guideline Title Affirmed Question Affirmed Notes  Low Blood Pressure [id number] Systolic BP [id number] AND [id number] taking blood pressure medications AND [3] NOT dizzy, lightheaded or weak    Final Disposition User   See Physician within 24 Hours [place], RN, [place]    Comments  appt scheduled for 11/22/15 at 2:30 pm with Clarene Reamer   Referrals  REFERRED TO PCP OFFICE   Disagree/Comply: Leta Baptist

## 2015-11-22 NOTE — ED Notes (Signed)
Pt denies any dizziness or lightheadedness while sitting and standing.

## 2015-11-22 NOTE — ED Notes (Signed)
Pt. Verbalizes understanding of d/c instructions, and follow-up. VS stable and pain controlled per pt.  Pt. In NAD at time of d/c and denies further concerns regarding this visit. Pt. Stable at the time of departure from the unit, departing unit by the safest and most appropriate manner per that pt condition and limitations. Pt advised to return to the ED at any time for emergent concerns, or for new/worsening symptoms.   

## 2015-11-22 NOTE — Progress Notes (Signed)
Subjective:    Patient ID: William Walton, male    DOB: 1941/01/18, 75 y.o.   MRN: [id number]  HPI This is a 75 yo male who presents today in follow up of ER visit yesterday. He stood from a chair, felt very dizzy and passed out. His vision was blurred for a while. He hit the right side of his head, shoulder and hip on the floor. Is having some jabbing pain on his low back and right hip. When he took blood pressure at home immediately following episode was 69/46.  He had negative head ct and essentially normal blood work. He was instructed to hold his metoprolol for bp less than 100. Has not taken his medication today. Blood pressure today at 12 was 90/56. Other than feeling achy, he feels ok- no more lightheadedness. He had similar episode 09/30/15 but without syncope and fall- had dizziness and blurred vision. Stroke work up was negative.  Sees William Walton for diabetes, most recent HgbA1c 6.4. Is currently taking metoprolol 25 mg bid (this was listed as 1/2 tablet but patient taking 1 tablet), prazosin 1 mg (for PTSD nightmares) and ramipril 5 mg.   Past Medical History:  Diagnosis Date  . Arthritis   . CAD (coronary artery disease) 08/2010   STEMI 08/2010 s/p 5v CABG at [facility], cardiac rehab - only did 7/26 sessions  . Diabetes mellitus type II 08/2010   followed by William Walton [facility]), no retinopathy William Walton)  . ED (erectile dysfunction)    likely medication induced  . HLD (hyperlipidemia)   . HTN (hypertension)   . Hypertension   . Mild carotid artery disease ([place]) 10/19/2015   Mild plaque build-up by [place] 10/2015, monitor clinically   . Spondyloarthropathy ([place]) 05/2014   by Xray  . 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 CATARACT EXTRACTION  11/2010  . CORONARY ARTERY BYPASS GRAFT  08/2010   5v, [facility] Dr. Julaine Hua  . ESOPHAGOGASTRODUODENOSCOPY  08/2010   normal esophagus, localized  gastric erythema  . TOTAL KNEE ARTHROPLASTY Left 08/2012   [facility] at [place]  . US ECHOCARDIOGRAPHY  08/2010   mild LV dysfxn (EF 45%) with severe LVH, nl R vent sys fxn   Family History  Problem Relation Age of Onset  . Cancer Father 68    died of prostate or bladder CA, pt unsure  . Coronary artery disease Brother 59    bypass  . Hyperlipidemia Brother   . Hypertension Brother   . Coronary artery disease Maternal Uncle 59    MI  . Diabetes Neg Hx   . Stroke Neg Hx    Social History  Substance Use Topics  . Smoking status: Former Smoker    Quit date: 02/16/1966  . Smokeless tobacco: Never Used  . Alcohol use No      Review of Systems Per HPI    Objective:   Physical Exam Physical Exam  Constitutional: Oriented to person, place, and time. He appears well-developed and well-nourished.  HENT:  Head: Normocephalic and atraumatic.  Eyes: Conjunctivae are normal.  Neck: Normal range of motion. Neck supple.  Cardiovascular: Normal rate, regular rhythm and normal heart sounds.   Pulmonary/Chest: Effort normal and breath sounds normal.  Musculoskeletal: Slightly decreased rotation of neck to left. Nontender. Right hip/flank without tenderness to palpation. No erythema/edema/ecchymosis.   Neurological: Alert and oriented to person, place, and time.  Skin: Skin  is warm and dry.  Psychiatric: Normal mood and affect. Behavior is normal. Judgment and thought content normal.  Vitals reviewed.     BP (!) 108/58   Pulse 62   Wt 187 lb (84.8 kg)   BMI 28.43 kg/m  Wt Readings from Last 3 Encounters:  11/22/15 187 lb (84.8 kg)  11/21/15 171 lb (77.6 kg)  11/15/15 175 lb (79.4 kg)       Assessment & Plan:  Discussed with William Walton 1. Syncope, unspecified syncope type - related to hypotension - will decrease metoprolol from 25 mg to 12.5 mg po BID - he will monitor his blood pressure before each dose for one week and hold if systolic BP less than [id number], he will notify us  by phone or mychart if BP consistently running below 100 or if he has symptoms.  - if continued hypotension, can consider reducing ramipril - follow up with William Walton in 2 months, sooner if needed  2. Fall, subsequent encounter - no concerning musculoskeltal findings on exam - encouraged him to avoid being stationary for too long - acetaminophen as needed - heat prn  3. Acute midline low back pain without sciatica - see #2  4. Right hip pain - see #2   William Reamer, FNP-BC  [facility] [place] [facility]  11/23/2015 7:01 AM

## 2015-11-22 NOTE — Telephone Encounter (Signed)
Pt has appt with Glenda Chroman FNP 11/22/15 at 2:30.

## 2015-12-11 DIAGNOSIS — E119 Type 2 diabetes mellitus without complications: Secondary | ICD-10-CM | POA: Diagnosis not present

## 2015-12-11 DIAGNOSIS — Z1283 Encounter for screening for malignant neoplasm of skin: Secondary | ICD-10-CM | POA: Diagnosis not present

## 2015-12-11 DIAGNOSIS — L821 Other seborrheic keratosis: Secondary | ICD-10-CM | POA: Diagnosis not present

## 2015-12-11 DIAGNOSIS — L570 Actinic keratosis: Secondary | ICD-10-CM | POA: Diagnosis not present

## 2015-12-11 DIAGNOSIS — L918 Other hypertrophic disorders of the skin: Secondary | ICD-10-CM | POA: Diagnosis not present

## 2015-12-11 DIAGNOSIS — L578 Other skin changes due to chronic exposure to nonionizing radiation: Secondary | ICD-10-CM | POA: Diagnosis not present

## 2015-12-11 DIAGNOSIS — D1800 Hemangioma unspecified site: Secondary | ICD-10-CM | POA: Diagnosis not present

## 2015-12-18 ENCOUNTER — Other Ambulatory Visit: Payer: Self-pay | Admitting: Internal Medicine

## 2016-01-19 ENCOUNTER — Other Ambulatory Visit: Payer: Self-pay | Admitting: Family Medicine

## 2016-01-19 DIAGNOSIS — N289 Disorder of kidney and ureter, unspecified: Secondary | ICD-10-CM

## 2016-01-19 DIAGNOSIS — E785 Hyperlipidemia, unspecified: Secondary | ICD-10-CM

## 2016-01-20 ENCOUNTER — Other Ambulatory Visit: Payer: Self-pay | Admitting: Internal Medicine

## 2016-01-20 ENCOUNTER — Ambulatory Visit (INDEPENDENT_AMBULATORY_CARE_PROVIDER_SITE_OTHER): Payer: Medicare Other

## 2016-01-20 VITALS — BP 112/70 | HR 71 | Temp 97.7°F | Ht 67.0 in | Wt 177.2 lb

## 2016-01-20 DIAGNOSIS — N289 Disorder of kidney and ureter, unspecified: Secondary | ICD-10-CM | POA: Diagnosis not present

## 2016-01-20 DIAGNOSIS — Z0000 Encounter for general adult medical examination without abnormal findings: Secondary | ICD-10-CM | POA: Diagnosis not present

## 2016-01-20 DIAGNOSIS — E785 Hyperlipidemia, unspecified: Secondary | ICD-10-CM | POA: Diagnosis not present

## 2016-01-20 LAB — RENAL FUNCTION PANEL
ALBUMIN: 4.5 g/dL (ref 3.5–5.2)
BUN: 15 mg/dL (ref 6–23)
CHLORIDE: 103 meq/L (ref 96–112)
CO2: 26 meq/L (ref 19–32)
Calcium: 9.6 mg/dL (ref 8.4–10.5)
Creatinine, Ser: 1.02 mg/dL (ref 0.40–1.50)
GFR: 75.82 mL/min (ref >60.00)
Glucose, Bld: 130 mg/dL — ABNORMAL HIGH (ref 70–99)
Phosphorus: 4.2 mg/dL (ref 2.3–4.6)
Potassium: 4.5 mEq/L (ref 3.5–5.1)
Sodium: 137 mEq/L (ref 135–145)

## 2016-01-20 LAB — LIPID PANEL
CHOLESTEROL: 265 mg/dL — AB (ref 0–200)
HDL: 32.2 mg/dL — AB (ref >39.00)
LDL CALC: 194 mg/dL — AB (ref 0–99)
NonHDL: 232.48
TRIGLYCERIDES: 192 mg/dL — AB (ref 0.0–149.0)
Total CHOL/HDL Ratio: 8
VLDL: 38.4 mg/dL (ref 0.0–40.0)

## 2016-01-20 NOTE — Progress Notes (Signed)
Subjective:   William Walton is a 75 y.o. male who presents for Medicare Annual/Subsequent preventive examination.  Review of Systems:  N/A Cardiac Risk Factors include: advanced age (>39men, >76 women);male gender;dyslipidemia;hypertension;diabetes mellitus     Objective:    Vitals: BP 112/70 (BP Location: Left Arm, Patient Position: Sitting, Cuff Size: Normal)   Pulse 71   Temp 97.7 F (36.5 C) (Oral)   Ht 5\' 7"  (1.702 m) Comment: no shoes  Wt 177 lb 4 oz (80.4 kg)   SpO2 97%   BMI 27.76 kg/m   Body mass index is 27.76 kg/m.  Tobacco History  Smoking Status  . Former Smoker  . Quit date: 02/16/1966  Smokeless Tobacco  . Never Used     Counseling given: No   Past Medical History:  Diagnosis Date  . Arthritis   . CAD (coronary artery disease) 08/2010   STEMI 08/2010 s/p 5v CABG at [facility], cardiac rehab - only did 7/26 sessions  . Diabetes mellitus type II 08/2010   followed by Dr. Gabriel Carina [facility]), no retinopathy Ellin Mayhew)  . ED (erectile dysfunction)    likely medication induced  . HLD (hyperlipidemia)   . HTN (hypertension)   . Hypertension   . Mild carotid artery disease ([place]) 10/19/2015   Mild plaque build-up by [place] 10/2015, monitor clinically   . Spondyloarthropathy ([place]) 05/2014   by Xray  . 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 CATARACT EXTRACTION  11/2010  . CORONARY ARTERY BYPASS GRAFT  08/2010   5v, [facility] Dr. Julaine Hua  . ESOPHAGOGASTRODUODENOSCOPY  08/2010   normal esophagus, localized gastric erythema  . TOTAL KNEE ARTHROPLASTY Left 08/2012   [facility] at [place]  . US ECHOCARDIOGRAPHY  08/2010   mild LV dysfxn (EF 45%) with severe LVH, nl R vent sys fxn   Family History  Problem Relation Age of Onset  . Cancer Father 1    died of prostate or bladder CA, pt unsure  . Coronary artery disease Brother 59    bypass  . Hyperlipidemia Brother   .  Hypertension Brother   . Coronary artery disease Maternal Uncle 59    MI  . Diabetes Neg Hx   . Stroke Neg Hx    History  Sexual Activity  . Sexual activity: No    Outpatient Encounter Prescriptions as of 01/20/2016  Medication Sig  . acetaminophen (TYLENOL) 325 MG tablet Take 650 mg by mouth every 6 (six) hours as needed. Reported on 08/15/2015  . amoxicillin (AMOXIL) 500 MG capsule Take 2,000 mg by mouth daily. Take one hour prior to dental treatment  . aspirin 81 MG tablet Take 81 mg by mouth every morning.   Marland Kitchen FREESTYLE LITE test strip USE TO TEST BLOOD SUGAR TWICE A DAY AS INSTRUCTED  . gabapentin (NEURONTIN) 300 MG capsule Take 900 mg by mouth 3 (three) times daily.  Marland Kitchen GLIPIZIDE XL 5 MG 24 hr tablet TAKE 1 TABLET IN THE MORNING AND 2 TABLETS IN THE EVENING  . INVOKANA 300 MG TABS tablet TAKE 1 TABLET EVERY MORNING  . L-Methylfolate-B6-B12 (FOLTANX) 3-35-2 MG TABS TAKE 1 TABLET TWICE A DAY  . meclizine (ANTIVERT) 25 MG tablet Take 25 mg by mouth 3 (three) times daily as needed for dizziness.   . metoprolol tartrate (LOPRESSOR) 25 MG tablet Take 25 mg by mouth 2 (two) times daily.  . prazosin (MINIPRESS) 1 MG capsule Take  1 mg by mouth at bedtime.  . ramipril (ALTACE) 5 MG capsule Take 5 mg by mouth daily.  . rosuvastatin (CRESTOR) 20 MG tablet Take 20 mg by mouth daily.  . sertraline (ZOLOFT) 100 MG tablet Take 100 mg by mouth daily.  . sitaGLIPtin-metformin (JANUMET) 50-1000 MG tablet Take 2 tablets by mouth every morning.   No facility-administered encounter medications on file as of 01/20/2016.     Activities of Daily Living In your present state of health, do you have any difficulty performing the following activities: 01/20/2016  Hearing? Y  Vision? N  Difficulty concentrating or making decisions? Y  Walking or climbing stairs? Y  Dressing or bathing? N  Doing errands, shopping? N  Preparing Food and eating ? N  Using the Toilet? N  In the past six months, have you  accidently leaked urine? N  Do you have problems with loss of bowel control? N  Managing your Medications? N  Managing your Finances? N  Housekeeping or managing your Housekeeping? N  Some recent data might be hidden    Patient Care Team: Ria Bush, MD as PCP - General (Family Medicine) [name], MD as Consulting Physician (Internal Medicine) Yolonda Kida, MD as Consulting Physician (Cardiology) D Orion Modest, OD as Consulting Physician (Optometry) Paralee Cancel, MD as Consulting Physician (Orthopedic Surgery) Ronnie Doss, MD as Resident (Internal Medicine)   Assessment:    Hearing Screening Comments: Wears bilateral hearing aids Vision Screening Comments: Last vision exam in August 2017 with Dr. Ellin Mayhew  Exercise Activities and Dietary recommendations Current Exercise Habits: The patient does not participate in regular exercise at present (walking 2-5x per month), Exercise limited by: orthopedic condition(s)  Goals    . Increase water intake          Starting 01/20/16, I will attempt to drink at least 6 glasses of water daily.       Fall Risk Fall Risk  01/20/2016  Falls in the past year? Yes  Number falls in past yr: 2 or more  Injury with Fall? Yes  Follow up Falls evaluation completed   Depression Screen PHQ 2/9 Scores 01/20/2016  PHQ - 2 Score 1  PHQ- 9 Score 7    Cognitive Function MMSE - Mini Mental State Exam 01/20/2016  Orientation to time 5  Orientation to Place 5  Registration 3  Attention/ Calculation 0  Recall 2  Recall-comments pt was unable to recall 1 of 3 words  Language- name 2 objects 0  Language- repeat 1  Language- follow 3 step command 2  Language- follow 3 step command-comments pt was unable to follow 1 step of 3 step command  Language- read & follow direction 0  Write a sentence 0  Copy design 0  Total score 18       PLEASE NOTE: A Mini-Cog screen was completed. Maximum score is 20. A value of 0 denotes this  part of Folstein MMSE was not completed or the patient failed this part of the Mini-Cog screening.   Mini-Cog Screening Orientation to Time - Max 5 pts Orientation to Place - Max 5 pts Registration - Max 3 pts Recall - Max 3 pts Language Repeat - Max 1 pts Language Follow 3 Step Command - Max 3 pts   Immunization History  Administered Date(s) Administered  . Influenza, High Dose Seasonal PF 11/15/2015  . Influenza,inj,Quad PF,36+ Mos 11/17/2012  . Pneumococcal Polysaccharide-23 09/17/2011  . Td 08/31/2010   Screening Tests Health Maintenance  Topic Date Due  . DTaP/Tdap/Td (1 - Tdap) 08/30/2020 (Originally 09/01/2010)  . COLONOSCOPY  01/19/2026 (Originally 10/01/1991)  . HEMOGLOBIN A1C  05/14/2016  . FOOT EXAM  07/17/2016  . OPHTHALMOLOGY EXAM  07/30/2016  . COLON CANCER SCREENING ANNUAL FOBT  11/16/2016  . TETANUS/TDAP  08/30/2020  . INFLUENZA VACCINE  Addressed  . ZOSTAVAX  Addressed  . PNA vac Low Risk Adult  Addressed      Plan:     I have personally reviewed and addressed the Medicare Annual Wellness questionnaire and have noted the following in the patient's chart:  A. Medical and social history B. Use of alcohol, tobacco or illicit drugs  C. Current medications and supplements D. Functional ability and status E.  Nutritional status F.  Physical activity G. Advance directives H. List of other physicians I.  Hospitalizations, surgeries, and ER visits in previous 12 months J.  [place] to include hearing, vision, cognitive, depression L. Referrals and appointments - none  In addition, I have reviewed and discussed with patient certain preventive protocols, quality metrics, and best practice recommendations. A written personalized care plan for preventive services as well as general preventive health recommendations were provided to patient.  See attached scanned questionnaire for additional information.   Signed,   Lindell Noe, MHA, BS,  LPN Health Coach

## 2016-01-20 NOTE — Progress Notes (Signed)
Pre visit review using our clinic review tool, if applicable. No additional management support is needed unless otherwise documented below in the visit note. 

## 2016-01-20 NOTE — Patient Instructions (Signed)
William Walton , Thank you for taking time to come for your Medicare Wellness Visit. I appreciate your ongoing commitment to your health goals. Please review the following plan we discussed and let me know if I can assist you in the future.   These are the goals we discussed: Goals    . Increase water intake          Starting 01/20/16, I will attempt to drink at least 6 glasses of water daily.        This is a list of the screening recommended for you and due dates:  Health Maintenance  Topic Date Due  . DTaP/Tdap/Td vaccine (1 - Tdap) 08/30/2020*  . Colon Cancer Screening  01/19/2026*  . Hemoglobin A1C  05/14/2016  . Complete foot exam   07/17/2016  . Eye exam for diabetics  07/30/2016  . Stool Blood Test  11/16/2016  . Tetanus Vaccine  08/30/2020  . Flu Shot  Addressed  . Shingles Vaccine  Addressed  . Pneumonia vaccines  Addressed  *Topic was postponed. The date shown is not the original due date.   Preventive Care for Adults  A healthy lifestyle and preventive care can promote health and wellness. Preventive health guidelines for adults include the following key practices.  . A routine yearly physical is a good way to check with your health care provider about your health and preventive screening. It is a chance to share any concerns and updates on your health and to receive a thorough exam.  . Visit your dentist for a routine exam and preventive care every 6 months. Brush your teeth twice a day and floss once a day. Good oral hygiene prevents tooth decay and gum disease.  . The frequency of eye exams is based on your age, health, family medical history, use  of contact lenses, and other factors. Follow your health care provider's ecommendations for frequency of eye exams.  . Eat a healthy diet. Foods like vegetables, fruits, whole grains, low-fat dairy products, and lean protein foods contain the nutrients you need without too many calories. Decrease your intake of foods high in  solid fats, added sugars, and salt. Eat the right amount of calories for you. Get information about a proper diet from your health care provider, if necessary.  . Regular physical exercise is one of the most important things you can do for your health. Most adults should get at least 150 minutes of moderate-intensity exercise (any activity that increases your heart rate and causes you to sweat) each week. In addition, most adults need muscle-strengthening exercises on 2 or more days a week.  Silver Sneakers may be a benefit available to you. To determine eligibility, you may visit the website: www.silversneakers.com or contact program at 207-121-9945 Mon-Fri between 8AM-8PM.   . Maintain a healthy weight. The body mass index (BMI) is a screening tool to identify possible weight problems. It provides an estimate of body fat based on height and weight. Your health care provider can find your BMI and can help you achieve or maintain a healthy weight.   For adults 20 years and older: ? A BMI below 18.5 is considered underweight. ? A BMI of 18.5 to 24.9 is normal. ? A BMI of 25 to 29.9 is considered overweight. ? A BMI of 30 and above is considered obese.   . Maintain normal blood lipids and cholesterol levels by exercising and minimizing your intake of saturated fat. Eat a balanced diet with plenty of fruit  and vegetables. Blood tests for lipids and cholesterol should begin at age 38 and be repeated every 5 years. If your lipid or cholesterol levels are high, you are over 50, or you are at high risk for heart disease, you may need your cholesterol levels checked more frequently. Ongoing high lipid and cholesterol levels should be treated with medicines if diet and exercise are not working.  . If you smoke, find out from your health care provider how to quit. If you do not use tobacco, please do not start.  . If you choose to drink alcohol, please do not consume more than 2 drinks per day. One drink  is considered to be 12 ounces (355 mL) of beer, 5 ounces (148 mL) of wine, or 1.5 ounces (44 mL) of liquor.  . If you are 12-21 years old, ask your health care provider if you should take aspirin to prevent strokes.  . Use sunscreen. Apply sunscreen liberally and repeatedly throughout the day. You should seek shade when your shadow is shorter than you. Protect yourself by wearing long sleeves, pants, a wide-brimmed hat, and sunglasses year round, whenever you are outdoors.  . Once a month, do a whole body skin exam, using a mirror to look at the skin on your back. Tell your health care provider of new moles, moles that have irregular borders, moles that are larger than a pencil eraser, or moles that have changed in shape or color.

## 2016-01-20 NOTE — Progress Notes (Signed)
I reviewed health advisor's note, was available for consultation, and agree with documentation and plan.  

## 2016-01-20 NOTE — Progress Notes (Signed)
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 Per pt, these flukes can lead to cancer.  Pt also plans to contact [place] about testing.   Nurse concerns:  None  Next PCP appt:   02/06/16 @ 1230

## 2016-01-24 ENCOUNTER — Other Ambulatory Visit: Payer: Self-pay

## 2016-02-06 ENCOUNTER — Encounter: Payer: Self-pay | Admitting: Family Medicine

## 2016-02-06 ENCOUNTER — Ambulatory Visit (INDEPENDENT_AMBULATORY_CARE_PROVIDER_SITE_OTHER): Payer: Medicare Other | Admitting: Family Medicine

## 2016-02-06 VITALS — BP 118/62 | HR 66 | Temp 97.6°F | Ht 67.0 in | Wt 180.5 lb

## 2016-02-06 DIAGNOSIS — E1159 Type 2 diabetes mellitus with other circulatory complications: Secondary | ICD-10-CM | POA: Diagnosis not present

## 2016-02-06 DIAGNOSIS — Z7189 Other specified counseling: Secondary | ICD-10-CM | POA: Diagnosis not present

## 2016-02-06 DIAGNOSIS — R634 Abnormal weight loss: Secondary | ICD-10-CM

## 2016-02-06 DIAGNOSIS — G459 Transient cerebral ischemic attack, unspecified: Secondary | ICD-10-CM

## 2016-02-06 DIAGNOSIS — E785 Hyperlipidemia, unspecified: Secondary | ICD-10-CM | POA: Diagnosis not present

## 2016-02-06 DIAGNOSIS — I2510 Atherosclerotic heart disease of native coronary artery without angina pectoris: Secondary | ICD-10-CM

## 2016-02-06 DIAGNOSIS — I10 Essential (primary) hypertension: Secondary | ICD-10-CM | POA: Diagnosis not present

## 2016-02-06 NOTE — Progress Notes (Signed)
BP 118/62 (BP Location: Left Arm, Patient Position: Sitting, Cuff Size: Normal)   Pulse 66   Temp 97.6 F (36.4 C) (Oral)   Ht 5\' 7"  (1.702 m)   Wt 180 lb 8 oz (81.9 kg)   SpO2 95%   BMI 28.27 kg/m    CC: AMW f/u visit Subjective:    Patient ID: William Walton, male    DOB: February 07, 1941, 75 y.o.   MRN: [id number]  HPI: William Walton is a 75 y.o. male presenting on 02/06/2016 for Annual Exam (AWV with Lattie Haw on 01/20/16)   Annia Belt for medicare wellness visit earlier in the month, note reviewed. Has had falls. Depression score 7.   Episode of syncope earlier this year evaluated by Debbie. Thought BP related - metoprolol dosing was decreased to 12.5mg  BID.   Worried about liver flukes during his stay in [place]. Asks about PCR for this. Noticing increasing itching, lighter colored stools, and unexpected weight loss (20 lbs over 3 months, now stabilized).   Denies diet changes recently.   Preventative: Colon cancer screening - no colonoscopy. Stool kits at [facility] yearly.  Prostate cancer screening - normal at [facility] 02/2015. fmhx prostate cancer Flu shot yearly Tetanus done in hospital 08/2010. Pneumovax 2013, prevnar 01/2015 Td 2012 zostavax 02/2015 Advanced directive planning - has living will at home, is organ donor.  HCPOA is wife. Seat belt use discussed Sunscreen use discussed.  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 Interim medical history since our last visit reviewed. Allergies and medications reviewed and updated. Current Outpatient Prescriptions on File Prior to Visit  Medication  Sig  . amoxicillin (AMOXIL) 500 MG capsule Take 2,000 mg by mouth daily. Take one hour prior to dental treatment  . aspirin 81 MG tablet Take 81 mg by mouth every morning.   Marland Kitchen FREESTYLE LITE test strip USE TO TEST BLOOD SUGAR TWICE A DAY AS INSTRUCTED  . gabapentin (NEURONTIN) 300 MG capsule Take 900 mg by mouth 3 (three) times daily.  Marland Kitchen GLIPIZIDE XL 5 MG 24 hr tablet TAKE 1 TABLET IN THE MORNING AND 2 TABLETS IN THE EVENING  . INVOKANA 300 MG TABS tablet TAKE 1 TABLET EVERY MORNING  . L-Methylfolate-B6-B12 (FOLTANX) 3-35-2 MG TABS TAKE 1 TABLET TWICE A DAY  . metoprolol tartrate (LOPRESSOR) 25 MG tablet Take 12.5 mg by mouth 2 (two) times daily.   . prazosin (MINIPRESS) 1 MG capsule Take 1 mg by mouth at bedtime.  . ramipril (ALTACE) 5 MG capsule Take 5 mg by mouth daily.  . rosuvastatin (CRESTOR) 20 MG tablet Take 20 mg by mouth daily.  . sertraline (ZOLOFT) 100 MG tablet Take 100 mg by mouth daily.  . sitaGLIPtin-metformin (JANUMET) 50-1000 MG tablet Take 2 tablets by mouth every morning.  Marland Kitchen acetaminophen (TYLENOL) 325 MG tablet Take 650 mg by mouth every 6 (six) hours as needed. Reported on 08/15/2015   No current facility-administered medications on file prior to visit.     Review of Systems Per HPI unless specifically indicated in ROS section     Objective:    BP 118/62 (BP Location: Left Arm, Patient Position: Sitting,  Cuff Size: Normal)   Pulse 66   Temp 97.6 F (36.4 C) (Oral)   Ht 5\' 7"  (1.702 m)   Wt 180 lb 8 oz (81.9 kg)   SpO2 95%   BMI 28.27 kg/m   Wt Readings from Last 3 Encounters:  02/06/16 180 lb 8 oz (81.9 kg)  01/20/16 177 lb 4 oz (80.4 kg)  11/22/15 187 lb (84.8 kg)    Physical Exam  Constitutional: He appears well-developed and well-nourished. No distress.  HENT:  Mouth/Throat: Oropharynx is clear and moist. No oropharyngeal exudate.  Neck: No thyromegaly present.  Cardiovascular: Normal rate, regular rhythm, normal heart sounds and intact distal  pulses.   No murmur heard. Pulmonary/Chest: Effort normal and breath sounds normal. No respiratory distress. He has no wheezes. He has no rales.  Abdominal: Soft. Bowel sounds are normal. He exhibits no distension and no mass. There is no tenderness. There is no rebound and no guarding.  Musculoskeletal: He exhibits no edema.  Skin: Skin is warm and dry. No rash noted.  Psychiatric: He has a normal mood and affect.  Nursing note and vitals reviewed.  Results for orders placed or performed in visit on 01/20/16  Lipid panel  Result Value Ref Range   Cholesterol 265 (H) 0 - 200 mg/dL   Triglycerides 192.0 (H) 0.0 - 149.0 mg/dL   HDL 32.20 (L) >39.00 mg/dL   VLDL 38.4 0.0 - 40.0 mg/dL   LDL Cholesterol 194 (H) 0 - 99 mg/dL   Total CHOL/HDL Ratio 8    NonHDL 232.48   Renal function panel  Result Value Ref Range   Sodium 137 135 - 145 mEq/L   Potassium 4.5 3.5 - 5.1 mEq/L   Chloride 103 96 - 112 mEq/L   CO2 26 19 - 32 mEq/L   Calcium 9.6 8.4 - 10.5 mg/dL   Albumin 4.5 3.5 - 5.2 g/dL   BUN 15 6 - 23 mg/dL   Creatinine, Ser 1.02 0.40 - 1.50 mg/dL   Glucose, Bld 130 (H) 70 - 99 mg/dL   Phosphorus 4.2 2.3 - 4.6 mg/dL   GFR 75.82 >60.00 mL/min      Assessment & Plan:   Problem List Items Addressed This Visit    Advanced care planning/counseling discussion    Advanced directive planning - has living will at home, is organ donor.  HCPOA is wife.      Controlled type 2 diabetes mellitus with circulatory disorder ([place])    Appreciate endo care. Chronic, improved.       HLD (hyperlipidemia)    Chronic, sudden deterioration over the past 9 months with unclear cause. Pt endorses continuing to take crestor every night - will double check at home tonight.  Return tomorrow fasting for recheck - if remaining uncontrolled, will increase crestor dose.      Relevant Orders   Hepatic function panel   LDL Cholesterol, Direct   HTN (hypertension) - Primary    Chronic, stable. Continue  current regimen.       TIA (transient ischemic attack)    Continue aspirin 81mg  daily.      Weight loss    Weight has stabilized. Pt worried about possible exposure to liver flukes during time in [place] remotely. Will return for CBC, LFTs.       Relevant Orders   CBC with Differential/Platelet   Hepatic function panel       Follow up plan: Return in about 6 months (around 08/06/2016) for follow up visit.  Ria Bush, MD

## 2016-02-06 NOTE — Patient Instructions (Addendum)
Bring me copy of your living will. Return tomorrow morning fasting for repeat labs Return in 6 months for follow up visit.

## 2016-02-06 NOTE — Assessment & Plan Note (Addendum)
Weight has stabilized. Pt worried about possible exposure to liver flukes during time in [place] remotely. Will return for CBC, LFTs.

## 2016-02-06 NOTE — Assessment & Plan Note (Signed)
Appreciate endo care. Chronic, improved.

## 2016-02-06 NOTE — Assessment & Plan Note (Signed)
Chronic, stable. Continue current regimen. 

## 2016-02-06 NOTE — Assessment & Plan Note (Signed)
Continue aspirin 81 mg daily.   

## 2016-02-06 NOTE — Assessment & Plan Note (Signed)
Chronic, sudden deterioration over the past 9 months with unclear cause. Pt endorses continuing to take crestor every night - will double check at home tonight.  Return tomorrow fasting for recheck - if remaining uncontrolled, will increase crestor dose.

## 2016-02-06 NOTE — Progress Notes (Signed)
Pre visit review using our clinic review tool, if applicable. No additional management support is needed unless otherwise documented below in the visit note. 

## 2016-02-06 NOTE — Assessment & Plan Note (Signed)
Advanced directive planning - has living will at home, is organ donor.  HCPOA is wife.

## 2016-02-07 ENCOUNTER — Other Ambulatory Visit (INDEPENDENT_AMBULATORY_CARE_PROVIDER_SITE_OTHER): Payer: Medicare Other

## 2016-02-07 DIAGNOSIS — R634 Abnormal weight loss: Secondary | ICD-10-CM | POA: Diagnosis not present

## 2016-02-07 DIAGNOSIS — E785 Hyperlipidemia, unspecified: Secondary | ICD-10-CM

## 2016-02-07 LAB — CBC WITH DIFFERENTIAL/PLATELET
BASOS ABS: 0.1 10*3/uL (ref 0.0–0.1)
BASOS PCT: 0.8 % (ref 0.0–3.0)
Eosinophils Absolute: 0.4 10*3/uL (ref 0.0–0.7)
Eosinophils Relative: 4.5 % (ref 0.0–5.0)
HCT: 43.6 % (ref 39.0–52.0)
Hemoglobin: 14.9 g/dL (ref 13.0–17.0)
LYMPHS ABS: 2.5 10*3/uL (ref 0.7–4.0)
Lymphocytes Relative: 26.3 % (ref 12.0–46.0)
MCHC: 34.1 g/dL (ref 30.0–36.0)
MCV: 84.3 fl (ref 78.0–100.0)
MONOS PCT: 9.6 % (ref 3.0–12.0)
Monocytes Absolute: 0.9 10*3/uL (ref 0.1–1.0)
NEUTROS ABS: 5.7 10*3/uL (ref 1.4–7.7)
NEUTROS PCT: 58.8 % (ref 43.0–77.0)
PLATELETS: 216 10*3/uL (ref 150.0–400.0)
RBC: 5.17 Mil/uL (ref 4.22–5.81)
RDW: 14.2 % (ref 11.5–15.5)
WBC: 9.7 10*3/uL (ref 4.0–10.5)

## 2016-02-07 LAB — LDL CHOLESTEROL, DIRECT: LDL DIRECT: 141 mg/dL

## 2016-02-07 LAB — HEPATIC FUNCTION PANEL
ALBUMIN: 4.4 g/dL (ref 3.5–5.2)
ALK PHOS: 44 U/L (ref 39–117)
ALT: 14 U/L (ref 0–53)
AST: 17 U/L (ref 0–37)
Bilirubin, Direct: 0.1 mg/dL (ref 0.0–0.3)
TOTAL PROTEIN: 7.3 g/dL (ref 6.0–8.3)
Total Bilirubin: 0.4 mg/dL (ref 0.2–1.2)

## 2016-02-08 ENCOUNTER — Other Ambulatory Visit: Payer: Self-pay | Admitting: Family Medicine

## 2016-02-08 MED ORDER — ROSUVASTATIN CALCIUM 40 MG PO TABS: 40.0000 mg | ORAL_TABLET | Freq: Every day | ORAL | 1 refills | Status: DC

## 2016-03-16 ENCOUNTER — Encounter: Payer: Self-pay | Admitting: Internal Medicine

## 2016-03-16 ENCOUNTER — Ambulatory Visit (INDEPENDENT_AMBULATORY_CARE_PROVIDER_SITE_OTHER): Payer: Medicare Other | Admitting: Internal Medicine

## 2016-03-16 VITALS — BP 120/72 | HR 70 | Ht 67.5 in | Wt 180.0 lb

## 2016-03-16 DIAGNOSIS — E1165 Type 2 diabetes mellitus with hyperglycemia: Secondary | ICD-10-CM

## 2016-03-16 DIAGNOSIS — IMO0002 Reserved for concepts with insufficient information to code with codable children: Secondary | ICD-10-CM

## 2016-03-16 DIAGNOSIS — E1151 Type 2 diabetes mellitus with diabetic peripheral angiopathy without gangrene: Secondary | ICD-10-CM

## 2016-03-16 LAB — POCT GLYCOSYLATED HEMOGLOBIN (HGB A1C): HEMOGLOBIN A1C: 6.6

## 2016-03-16 NOTE — Progress Notes (Signed)
Patient ID: William Walton, male   DOB: [birth year], 76 y.o.   MRN: [id number]  HPI: William Walton is a 76 y.o.-year-old male, returning for f/u for DM2, dx 2010, non-insulin-dependent, uncontrolled, with complications (CAD - s/p CABG, peripheral neuropathy, ED), likely 2/2 Agent Orange. Also goes to the [place] in [place] now (previosusly was going to [place]). Last visit with me was 4 mo ago. PCP: [name].  He had syncope (11/2015) - BP-related. BP meds were adjusted.  He was doing a great job exercising more and reduced portions/changed diet. He did not exercise much over the Holidays >> will restart.  HbA1C levels: Lab Results  Component Value Date   HGBA1C 6.4 11/15/2015   HGBA1C 7.1 08/15/2015   HGBA1C 7.5 05/08/2015  02/20/2011: A1c 7.1% 05/22/2011: A1c 6.8%, after which he was taken off the insulin >> A1C started to increase.  11/19/2011: A1c 8.5%, TSH 2.18, BUN/creatinine 16/1.0, ACR 3.7 02/24/2012: A1c 8.4%, vitamin B12 321 06/01/2012: 7.6% 01/26/2013: 7.3% - received records from [place]   Pt is on a regimen of: - Janumet XR 1000-50 mg x 2 a day - Glipizide ER 5 mg in am and 10 mg in pm - Invokana 300 mg daily  Pt checks his sugars 2x a day >> reviewed detailed log: - am:  93, 106-134, 155, 180 >> 107-154, 188 >> 84-127, 132 >> 86, 91-129, 145 >> 85-125, 129 - 2h after b'fast: 113 >> n/c >> 136 >> 166 >> 162 >> 168 - before lunch: 95-134 >> 140 >> 80, 139, 149 >> n/c >> 102 >> n/c - 2h after lunch: 80 >> n/c >> 78 >> n/c >> 103 >> 107-184 >> n/c - before dinner:  176-191, 226 >> 106-141, 151 >> 119 >> 117, 144 >> 94-137, 146 >> 116, 135 - 2h after dinner: 113-189 >> 104-181 >> 114-189, 206  >> 135-189, 239 >> 108-188 >> 109-168, 177, 181 - bedtime: 94-198 (263) >> 118-174 >> 115-159 >> 151-185, 220 >> 148, 152 >> 159 >> 119, 121 >> n/c - at 11 pm: 90 >> 90, 118, 181 >> 167 >> n/c >> 101 No lows. Lowest sugar was 86 >> 85; he has hypoglycemia  awareness at 70.  Highest sugar was 188 >> 181  He exercises by going to the gym 3x a week.  - has mild CKD. Last BUN/creatinine:  Lab Results  Component Value Date   BUN 15 01/20/2016   BUN 19 11/21/2015   CREATININE 1.02 01/20/2016   CREATININE 1.32 (H) 11/21/2015  19/1.2 (GFR 63.4) in 01/26/2013  16/1.0 in 05/2012.  He is on Lisinopril 2.5.   - has HL. Last set of lipids:  Lab Results  Component Value Date   CHOL 265 (H) 01/20/2016   HDL 32.20 (L) 01/20/2016   LDLCALC 194 (H) 01/20/2016   LDLDIRECT 141.0 02/07/2016   TRIG 192.0 (H) 01/20/2016   CHOLHDL 8 01/20/2016  212/249/29/106 (01/26/2013)  200/214/26.8/130.4  On Crestor 20 mg daily.  He is on ASA 81.  - last eye exam was:  Abstract on 08/07/2015  Component Date Value Ref Range Status  . [facility] Diabetic Eye Exam- [name].  07/31/2015 No Retinopathy  No Retinopathy Final   - + numbness and tingling in his feet. He is on Neurontin 300 mg 3x a day + Metanx - generic.  He also has a history of PTSD Web designer) - on Zoloft >> reduced the dose., HTN, HL, h/o L TKR.  I reviewed pt's medications, allergies, PMH, social hx, family hx, and changes were documented in the history of present illness. Otherwise, unchanged from my initial visit note.  ROS: Constitutional: no weight gain/loss, no fatigue, no subjective hyperthermia/hypothermia, no  poor sleep, no nocturia Eyes: no blurry vision, no xerophthalmia ENT: no sore throat, no nodules palpated in throat, no dysphagia/odynophagia, no hoarseness Cardiovascular: no CP/SOB/palpitations/leg swelling Respiratory: no cough/SOB Gastrointestinal: no N/V/D/C Musculoskeletal: no muscle/joint aches Skin: no rash Neurological: no tremors/numbness/tingling/dizziness  PE: BP 120/72 (BP Location: Left Arm, Patient Position: Sitting)   Pulse 70   Ht 5' 7.5" (1.715 m)   Wt 180 lb (81.6 kg)   SpO2 97%   BMI 27.78 kg/m  Body mass index is 27.78 kg/m.  Wt Readings from  Last 3 Encounters:  03/16/16 180 lb (81.6 kg)  02/06/16 180 lb 8 oz (81.9 kg)  01/20/16 177 lb 4 oz (80.4 kg)   Constitutional: overweight, in NAD Eyes: PERRLA, EOMI, no exophthalmos ENT: moist mucous membranes, no thyromegaly, no cervical lymphadenopathy Cardiovascular: RRR, No MRG Respiratory: CTA B Gastrointestinal: abdomen soft, NT, ND, BS+ Musculoskeletal: no deformities, strength intact in all 4 Skin: moist, warm, no rashes  ASSESSMENT: 1. DM2, non-insulin-dependent, uncontrolled, with complications - CAD, s/p AMI and 5v CABG at [facility] 08/29/2010 - Peripheral neuropathy - ED  2. PN  PLAN:  1. Patient with long-standing, fairly well controlled diabetes, with better sugars over the last year due to improved diet and increased exercise. He stopped exercising over the [facility] >> will restart. No changes needed in his med regimen. - a good HbA1c target for him of ~7.5% due to age.  - I suggested to continue current regimen:  Patient Instructions  Please continue: - Janumet XR 1000-50 mg 2 tablets in am - Glipizide ER 5 mg in am and 10 mg in pm - Invokana 300 mg in am   Please return in 4 months with your sugar log.   - continue check 2 times a day, rotating checks - HbA1c today is great: 6.6%  - up to date with eye exams - given flu shot at last visit - Return to clinic in 4 months with sugar log   2. Peripheral neuropathy - on Neurontin 300 mg tid + generic Metanx  - denies tingling/burning pain in feet  [name], MD PhD [facility]

## 2016-03-16 NOTE — Patient Instructions (Signed)
Please continue: - Janumet XR 1000-50 mg 2 tablets in am - Glipizide ER 5 mg in am and 10 mg in pm - Invokana 300 mg in am   Please return in 4 months with your sugar log.  

## 2016-03-16 NOTE — Addendum Note (Signed)
Addended by: Caprice Beaver T on: 03/16/2016 10:27 AM   Modules accepted: Orders

## 2016-04-02 ENCOUNTER — Other Ambulatory Visit: Payer: Self-pay | Admitting: Internal Medicine

## 2016-04-02 ENCOUNTER — Encounter: Payer: Self-pay | Admitting: *Deleted

## 2016-04-02 ENCOUNTER — Emergency Department
Admission: EM | Admit: 2016-04-02 | Discharge: 2016-04-02 | Disposition: A | Discharge status: Home / Self Care | Payer: Medicare Other | Attending: Emergency Medicine | Admitting: Emergency Medicine

## 2016-04-02 DIAGNOSIS — Z79899 Other long term (current) drug therapy: Secondary | ICD-10-CM | POA: Diagnosis not present

## 2016-04-02 DIAGNOSIS — E119 Type 2 diabetes mellitus without complications: Secondary | ICD-10-CM | POA: Diagnosis not present

## 2016-04-02 DIAGNOSIS — R42 Dizziness and giddiness: Secondary | ICD-10-CM | POA: Diagnosis present

## 2016-04-02 DIAGNOSIS — Z7982 Long term (current) use of aspirin: Secondary | ICD-10-CM | POA: Insufficient documentation

## 2016-04-02 DIAGNOSIS — I10 Essential (primary) hypertension: Secondary | ICD-10-CM | POA: Insufficient documentation

## 2016-04-02 DIAGNOSIS — I2510 Atherosclerotic heart disease of native coronary artery without angina pectoris: Secondary | ICD-10-CM | POA: Insufficient documentation

## 2016-04-02 DIAGNOSIS — Z87891 Personal history of nicotine dependence: Secondary | ICD-10-CM | POA: Diagnosis not present

## 2016-04-02 DIAGNOSIS — E860 Dehydration: Secondary | ICD-10-CM

## 2016-04-02 LAB — CBC
HCT: 41.5 % (ref 40.0–52.0)
Hemoglobin: 13.8 g/dL (ref 13.0–18.0)
MCH: 28.5 pg (ref 26.0–34.0)
MCHC: 33.3 g/dL (ref 32.0–36.0)
MCV: 85.5 fL (ref 80.0–100.0)
PLATELETS: 200 10*3/uL (ref 150–440)
RBC: 4.85 MIL/uL (ref 4.40–5.90)
RDW: 14.9 % — ABNORMAL HIGH (ref 11.5–14.5)
WBC: 12.2 10*3/uL — AB (ref 3.8–10.6)

## 2016-04-02 LAB — BASIC METABOLIC PANEL
Anion gap: 10 (ref 5–15)
BUN: 24 mg/dL — ABNORMAL HIGH (ref 6–20)
CALCIUM: 9.1 mg/dL (ref 8.9–10.3)
CHLORIDE: 106 mmol/L (ref 101–111)
CO2: 21 mmol/L — ABNORMAL LOW (ref 22–32)
CREATININE: 1.7 mg/dL — AB (ref 0.61–1.24)
GFR, EST AFRICAN AMERICAN: 44 mL/min — AB (ref >60)
GFR, EST NON AFRICAN AMERICAN: 38 mL/min — AB (ref >60)
Glucose, Bld: 139 mg/dL — ABNORMAL HIGH (ref 65–99)
Potassium: 4.7 mmol/L (ref 3.5–5.1)
SODIUM: 137 mmol/L (ref 135–145)

## 2016-04-02 MED ORDER — SODIUM CHLORIDE 0.9 % IV BOLUS (SEPSIS)
1000.0000 mL | Freq: Once | INTRAVENOUS | Status: AC
  Administered 2016-04-02: 1000 mL via INTRAVENOUS

## 2016-04-02 NOTE — ED Provider Notes (Signed)
[facility] Emergency Department Provider Note    ____________________________________________   I have reviewed the triage vital signs and the nursing notes.   HISTORY  Chief Complaint Hypotension   History limited by: Not Limited   HPI William Walton is a 76 y.o. male who presents to the emergency department today because of an episode of hypotension and dizziness. The patient states that he spent most of the day outside filling and potholes. He started feeling dizzy this afternoon. When he came inside he checked his blood pressure and noticed that systolic was in the [id number]. He denies any concurrent chest pain or shortness of breath. He denies any recent illnesses. He states that since waiting here in the emergency department he has continued to feel better.   Past Medical History:  Diagnosis Date  . Arthritis   . CAD (coronary artery disease) 08/2010   STEMI 08/2010 s/p 5v CABG at [facility], cardiac rehab - only did 7/26 sessions  . Diabetes mellitus type II 08/2010   followed by Dr. Gabriel Carina [facility]), no retinopathy Ellin Mayhew)  . ED (erectile dysfunction)    likely medication induced  . HLD (hyperlipidemia)   . HTN (hypertension)   . Hypertension   . Mild carotid artery disease ([place]) 10/19/2015   Mild plaque build-up by [place] 10/2015, monitor clinically   . Spondyloarthropathy ([place]) 05/2014   by Xray  . Type I (juvenile type) diabetes mellitus with peripheral circulatory disorders, uncontrolled(250.73)     Patient Active Problem List   Diagnosis Date Noted  . Advanced care planning/counseling discussion 02/06/2016  . Mild carotid artery disease ([place]) 10/19/2015  . TIA (transient ischemic attack) 10/02/2015  . Weight loss 10/02/2015  . Injury of right leg 06/14/2015  . Primary osteoarthritis of both knees 05/18/2014  . Spondyloarthropathy ([place]) 05/18/2014  . Low back pain 05/10/2014  . Controlled type 2 diabetes mellitus with circulatory disorder ([place]) 12/01/2013   . Medicare annual wellness visit, initial 10/21/2010  . ED (erectile dysfunction) 10/21/2010  . HTN (hypertension)   . HLD (hyperlipidemia)   . CAD (coronary artery disease)     Past Surgical History:  Procedure Laterality Date  . CATARACT EXTRACTION  11/2010  . CORONARY ARTERY BYPASS GRAFT  08/2010   5v, [facility] Dr. Julaine Hua  . ESOPHAGOGASTRODUODENOSCOPY  08/2010   normal esophagus, localized gastric erythema  . TOTAL KNEE ARTHROPLASTY Left 08/2012   [facility] at [place]  . US ECHOCARDIOGRAPHY  08/2010   mild LV dysfxn (EF 45%) with severe LVH, nl R vent sys fxn    Prior to Admission medications   Medication Sig Start Date End Date Taking? Authorizing Provider  acetaminophen (TYLENOL) 325 MG tablet Take 650 mg by mouth every 6 (six) hours as needed. Reported on 08/15/2015    Historical Provider, MD  amoxicillin (AMOXIL) 500 MG capsule Take 2,000 mg by mouth daily. Take one hour prior to dental treatment    Historical Provider, MD  aspirin 81 MG tablet Take 81 mg by mouth every morning.     Historical Provider, MD  FREESTYLE LITE test strip USE TO TEST BLOOD SUGAR TWICE A DAY AS INSTRUCTED 08/05/15   [name], MD  gabapentin (NEURONTIN) 300 MG capsule Take 900 mg by mouth 3 (three) times daily.    Historical Provider, MD  GLIPIZIDE XL 5 MG 24 hr tablet TAKE 1 TABLET IN THE MORNING AND 2 TABLETS IN THE EVENING 04/02/16   [name], MD  INVOKANA 300 MG TABS tablet TAKE 1  TABLET EVERY MORNING 12/19/15   [name], MD  L-Methylfolate-B6-B12 Lebron Quam) 3-35-2 MG TABS TAKE 1 TABLET TWICE A DAY 03/27/14   [name], MD  metoprolol tartrate (LOPRESSOR) 25 MG tablet Take 12.5 mg by mouth 2 (two) times daily.     Historical Provider, MD  prazosin (MINIPRESS) 1 MG capsule Take 1 mg by mouth at bedtime.    Historical Provider, MD  ramipril (ALTACE) 5 MG capsule Take 5 mg by mouth daily.    Historical Provider, MD  rosuvastatin (CRESTOR) 40 MG tablet Take 1 tablet (40 mg total) by mouth  daily. 02/08/16   Ria Bush, MD  sertraline (ZOLOFT) 100 MG tablet Take 100 mg by mouth daily.    Historical Provider, MD  sitaGLIPtin-metformin (JANUMET) 50-1000 MG tablet Take 2 tablets by mouth every morning.    Historical Provider, MD    Allergies Patient has no known allergies.  Family History  Problem Relation Age of Onset  . Cancer Father 8    died of prostate or bladder CA, pt unsure  . Coronary artery disease Brother 59    bypass  . Hyperlipidemia Brother   . Hypertension Brother   . Coronary artery disease Maternal Uncle 59    MI  . Diabetes Neg Hx   . Stroke Neg Hx     Social History Social History  Substance Use Topics  . Smoking status: Former Smoker    Quit date: 02/16/1966  . Smokeless tobacco: Never Used  . Alcohol use No    Review of Systems  Constitutional: Negative for fever. Cardiovascular: Negative for chest pain. Respiratory: Negative for shortness of breath. Gastrointestinal: Negative for abdominal pain, vomiting and diarrhea. Genitourinary: Negative for dysuria. Musculoskeletal: Negative for back pain. Skin: Negative for rash. Neurological: Negative for headaches, focal weakness or numbness.  10-point ROS otherwise negative.  ____________________________________________   PHYSICAL EXAM:  VITAL SIGNS: ED Triage Vitals [04/02/16 1620]  Enc Vitals Group     BP (!) 104/50     Pulse Rate 88     Resp 18     Temp 98.7 F (37.1 C)     Temp Source Oral     SpO2 95 %     Weight 175 lb (79.4 kg)     Height 5\' 8"  (1.727 m)   Constitutional: Alert and oriented. Well appearing and in no distress. Eyes: Conjunctivae are normal. Normal extraocular movements. ENT   Head: Normocephalic and atraumatic.   Nose: No congestion/rhinnorhea.   Mouth/Throat: Mucous membranes are moist.   Neck: No stridor. Hematological/Lymphatic/Immunilogical: No cervical lymphadenopathy. Cardiovascular: Normal rate, regular rhythm.  No murmurs,  rubs, or gallops.  Respiratory: Normal respiratory effort without tachypnea nor retractions. Breath sounds are clear and equal bilaterally. No wheezes/rales/rhonchi. Gastrointestinal: Soft and non tender. No rebound. No guarding.  Genitourinary: Deferred Musculoskeletal: Normal range of motion in all extremities. No lower extremity edema. Neurologic:  Normal speech and language. No gross focal neurologic deficits are appreciated.  Skin:  Skin is warm, dry and intact. No rash noted. Psychiatric: Mood and affect are normal. Speech and behavior are normal. Patient exhibits appropriate insight and judgment.  ____________________________________________    LABS (pertinent positives/negatives)  Labs Reviewed  BASIC METABOLIC PANEL - Abnormal; Notable for the following:       Result Value   CO2 21 (*)    Glucose, Bld 139 (*)    BUN 24 (*)    Creatinine, Ser 1.70 (*)    GFR calc non Af Amer 38 (*)  GFR calc Af Amer 44 (*)    All other components within normal limits  CBC - Abnormal; Notable for the following:    WBC 12.2 (*)    RDW 14.9 (*)    All other components within normal limits     ____________________________________________   EKG  I, Nance Pear, attending physician, personally viewed and interpreted this EKG  EKG Time: 1627 Rate: 85 Rhythm: normal sinus rhythm Axis: normal Intervals: qtc 442 QRS: narrow, q waves V1-V3 ST changes: no st elevation Impression: abnormal ekg   ____________________________________________    RADIOLOGY  None   ____________________________________________   PROCEDURES  Procedures  ____________________________________________   INITIAL IMPRESSION / ASSESSMENT AND PLAN / ED COURSE  Pertinent labs & imaging results that were available during my care of the patient were reviewed by me and considered in my medical decision making (see chart for details).  Patient presented to the emergency department today after an  episode of hypotension and dizziness. She had been outside working and I do think some this could be related to dehydration. The patient's troponin was slightly elevated. I did discuss this with the patient. The patient will be given IV fluids. Patient would then like to be discharged home. At this point given lack of any evidence of infection I think it is reasonable for discharge.  ____________________________________________   FINAL CLINICAL IMPRESSION(S) / ED DIAGNOSES  Final diagnoses:  Dehydration     Note: This dictation was prepared with Dragon dictation. Any transcriptional errors that result from this process are unintentional     Nance Pear, MD 04/02/16 2150

## 2016-04-02 NOTE — Discharge Instructions (Signed)
Please see your primary care physician so they can recheck your creatinine level (a marker in blood that looks at kidney function and dehydration). Please seek medical attention for any high fevers, chest pain, shortness of breath, change in behavior, persistent vomiting, bloody stool or any other new or concerning symptoms.

## 2016-04-02 NOTE — ED Triage Notes (Signed)
Pt states he was working outside, got dizzy and when he checked his BP it was in the 70s, at present pt awake and alert, states some dizziness

## 2016-04-02 NOTE — ED Notes (Signed)
Patient reports that he felt dizzy earlier today after working to fill potholes with gravel using a shovel.  Patient reports that he now feels much better and would like to go home.  Patient is alert and oriented x 4.  Skin is warm and dry.  Dr. Archie Balboa aware.

## 2016-04-03 ENCOUNTER — Encounter: Payer: Self-pay | Admitting: Family Medicine

## 2016-04-22 DIAGNOSIS — H018 Other specified inflammations of eyelid: Secondary | ICD-10-CM | POA: Diagnosis not present

## 2016-04-22 DIAGNOSIS — H401130 Primary open-angle glaucoma, bilateral, stage unspecified: Secondary | ICD-10-CM | POA: Diagnosis not present

## 2016-04-22 DIAGNOSIS — H26491 Other secondary cataract, right eye: Secondary | ICD-10-CM | POA: Diagnosis not present

## 2016-04-22 DIAGNOSIS — E119 Type 2 diabetes mellitus without complications: Secondary | ICD-10-CM | POA: Diagnosis not present

## 2016-04-22 DIAGNOSIS — D3131 Benign neoplasm of right choroid: Secondary | ICD-10-CM | POA: Diagnosis not present

## 2016-04-22 DIAGNOSIS — H40019 Open angle with borderline findings, low risk, unspecified eye: Secondary | ICD-10-CM | POA: Diagnosis not present

## 2016-05-15 DIAGNOSIS — J209 Acute bronchitis, unspecified: Secondary | ICD-10-CM | POA: Diagnosis not present

## 2016-05-22 ENCOUNTER — Ambulatory Visit (INDEPENDENT_AMBULATORY_CARE_PROVIDER_SITE_OTHER): Payer: Medicare Other | Admitting: Family Medicine

## 2016-05-22 ENCOUNTER — Encounter: Payer: Self-pay | Admitting: Family Medicine

## 2016-05-22 ENCOUNTER — Ambulatory Visit (INDEPENDENT_AMBULATORY_CARE_PROVIDER_SITE_OTHER)
Admission: RE | Admit: 2016-05-22 | Discharge: 2016-05-22 | Disposition: A | Discharge status: Home / Self Care | Payer: Medicare Other | Source: Ambulatory Visit | Attending: Family Medicine | Admitting: Family Medicine

## 2016-05-22 VITALS — BP 114/72 | HR 67 | Temp 97.7°F | Wt 181.5 lb

## 2016-05-22 DIAGNOSIS — R05 Cough: Secondary | ICD-10-CM | POA: Insufficient documentation

## 2016-05-22 DIAGNOSIS — R059 Cough, unspecified: Secondary | ICD-10-CM | POA: Insufficient documentation

## 2016-05-22 MED ORDER — OMEPRAZOLE 40 MG PO CPDR: 40.0000 mg | DELAYED_RELEASE_CAPSULE | Freq: Every day | ORAL | 1 refills | Status: DC

## 2016-05-22 NOTE — Progress Notes (Signed)
Pre visit review using our clinic review tool, if applicable. No additional management support is needed unless otherwise documented below in the visit note. 

## 2016-05-22 NOTE — Patient Instructions (Addendum)
Chest xray today. This could be ramipril related or reflux related. Let's hold ramipril for next 1-2 weeks - if cough resolves then likely from this. If ongoing, fill omeprazole 40mg  daily course for 3 weeks (prescription provided today). Update me with effect in 2 weeks.

## 2016-05-22 NOTE — Assessment & Plan Note (Signed)
Given duration of sxs - check CXR.  Anticipate GERD vs ACEI related cough.  rec hold ramipril x 2wks and update me with effect. If cough resolves, would consider ARB in its place. If no improvement with holding ACEI, rec start 3 wk omeprazole 40mg  course.  Again, update [place] with effect. Pt agrees with plan.

## 2016-05-22 NOTE — Progress Notes (Signed)
BP 114/72   Pulse 67   Temp 97.7 F (36.5 C) (Oral)   Wt 181 lb 8 oz (82.3 kg)   SpO2 97%   BMI 27.60 kg/m    CC: cough Subjective:    Patient ID: TAJE LITTLER, male    DOB: 1940-09-09, 76 y.o.   MRN: [id number]  HPI: TERION HEDMAN is a 76 y.o. male presenting on 05/22/2016 for Cough (>1 month)   2 mo h/o dry nagging cough associated with nasal drainage. Coughing fits worse at night time. Dry cough. Some nasal drainage. Seen at [facility] 3 wks ago and treated with cough syrup and zpack. This didn't help much. Slight tickle in back of throat associated with this cough.   No fevers/chills, ear or tooth pain, headache, sore throat. No dyspnea or wheezing.  Denies night sweats, fatigue, or unexpected weight changes.  No significant allergic rhinitis symptoms other than some watery eyes.  Denies significant GERD or heartburn. He does like spicy foods - but cough is no worse after this.   Ex smoker - quit 1968s. ~10 PY hx.   Relevant past medical, surgical, family and social history reviewed and updated as indicated. Interim medical history since our last visit reviewed. Allergies and medications reviewed and updated. Outpatient Medications Prior to Visit  Medication Sig Dispense Refill  . acetaminophen (TYLENOL) 325 MG tablet Take 650 mg by mouth every 6 (six) hours as needed. Reported on 08/15/2015    . amoxicillin (AMOXIL) 500 MG capsule Take 2,000 mg by mouth daily. Take one hour prior to dental treatment    . aspirin 81 MG tablet Take 81 mg by mouth every morning.     Marland Kitchen FREESTYLE LITE test strip USE TO TEST BLOOD SUGAR TWICE A DAY AS INSTRUCTED 200 each 2  . gabapentin (NEURONTIN) 300 MG capsule Take 900 mg by mouth 3 (three) times daily.    Marland Kitchen GLIPIZIDE XL 5 MG 24 hr tablet TAKE 1 TABLET IN THE MORNING AND 2 TABLETS IN THE EVENING 270 tablet 0  . INVOKANA 300 MG TABS tablet TAKE 1 TABLET EVERY MORNING 90 tablet 1  . L-Methylfolate-B6-B12 (FOLTANX) 3-35-2 MG TABS TAKE 1 TABLET TWICE A  DAY 2 tablet 2  . metoprolol tartrate (LOPRESSOR) 25 MG tablet Take 12.5 mg by mouth 2 (two) times daily.     . prazosin (MINIPRESS) 1 MG capsule Take 1 mg by mouth at bedtime.    . ramipril (ALTACE) 5 MG capsule Take 5 mg by mouth daily.    . rosuvastatin (CRESTOR) 40 MG tablet Take 1 tablet (40 mg total) by mouth daily. 90 tablet 1  . sertraline (ZOLOFT) 100 MG tablet Take 100 mg by mouth daily.    . sitaGLIPtin-metformin (JANUMET) 50-1000 MG tablet Take 2 tablets by mouth every morning.     No facility-administered medications prior to visit.      Per HPI unless specifically indicated in ROS section below Review of Systems     Objective:    BP 114/72   Pulse 67   Temp 97.7 F (36.5 C) (Oral)   Wt 181 lb 8 oz (82.3 kg)   SpO2 97%   BMI 27.60 kg/m   Wt Readings from Last 3 Encounters:  05/22/16 181 lb 8 oz (82.3 kg)  04/02/16 175 lb (79.4 kg)  03/16/16 180 lb (81.6 kg)    Physical Exam  Constitutional: He appears well-developed and well-nourished. No distress.  HENT:  Head: Normocephalic and atraumatic.  Right  Ear: Hearing, tympanic membrane, external ear and ear canal normal.  Left Ear: Hearing, tympanic membrane, external ear and ear canal normal.  Nose: Nose normal. No mucosal edema or rhinorrhea. Right sinus exhibits no maxillary sinus tenderness and no frontal sinus tenderness. Left sinus exhibits no maxillary sinus tenderness and no frontal sinus tenderness.  Mouth/Throat: Uvula is midline, oropharynx is clear and moist and mucous membranes are normal. No oropharyngeal exudate, posterior oropharyngeal edema, posterior oropharyngeal erythema or tonsillar abscesses.  Hearing aides in place  Eyes: Conjunctivae and EOM are normal. Pupils are equal, round, and reactive to light. No scleral icterus.  Neck: Normal range of motion. Neck supple.  Cardiovascular: Normal rate, regular rhythm, normal heart sounds and intact distal pulses.   No murmur heard. Pulmonary/Chest:  Effort normal and breath sounds normal. No respiratory distress. He has no wheezes. He has no rales.  Lungs clear  Lymphadenopathy:    He has no cervical adenopathy.  Skin: Skin is warm and dry. No rash noted.  Nursing note and vitals reviewed.     Assessment & Plan:   Problem List Items Addressed This Visit    Cough - Primary    Given duration of sxs - check CXR.  Anticipate GERD vs ACEI related cough.  rec hold ramipril x 2wks and update me with effect. If cough resolves, would consider ARB in its place. If no improvement with holding ACEI, rec start 3 wk omeprazole 40mg  course.  Again, update [place] with effect. Pt agrees with plan.       Relevant Orders   DG Chest 2 View       Follow up plan: Return if symptoms worsen or fail to improve.  Ria Bush, MD

## 2016-06-08 IMAGING — DX DG KNEE AP/LAT W/ SUNRISE*R*
4 series · 4 of 4 positions shown · non-contrast
Comparison: None.

CLINICAL DATA: Right lower extremity twisting injury 1 week prior.

EXAM:
RIGHT KNEE 3 VIEWS

[knee ap (1 of 2)]
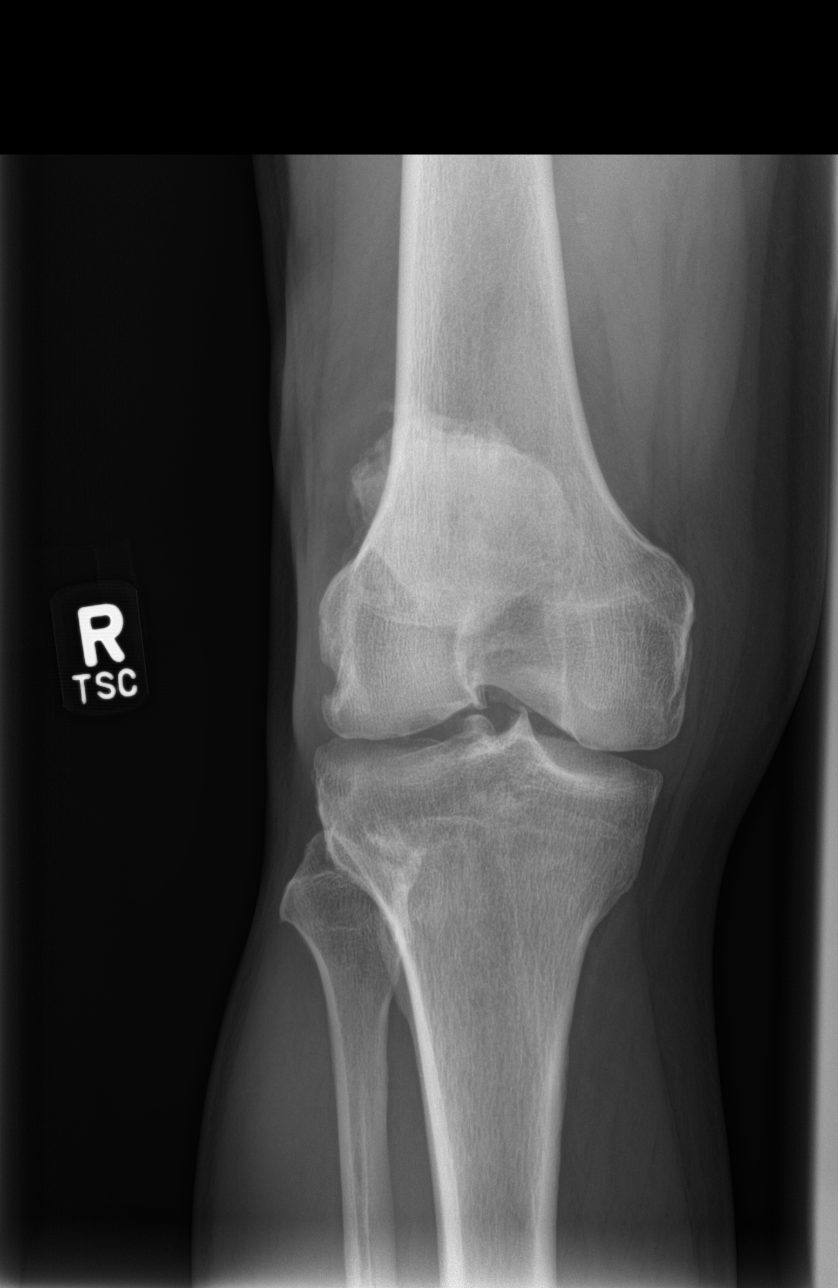

[knee lat]
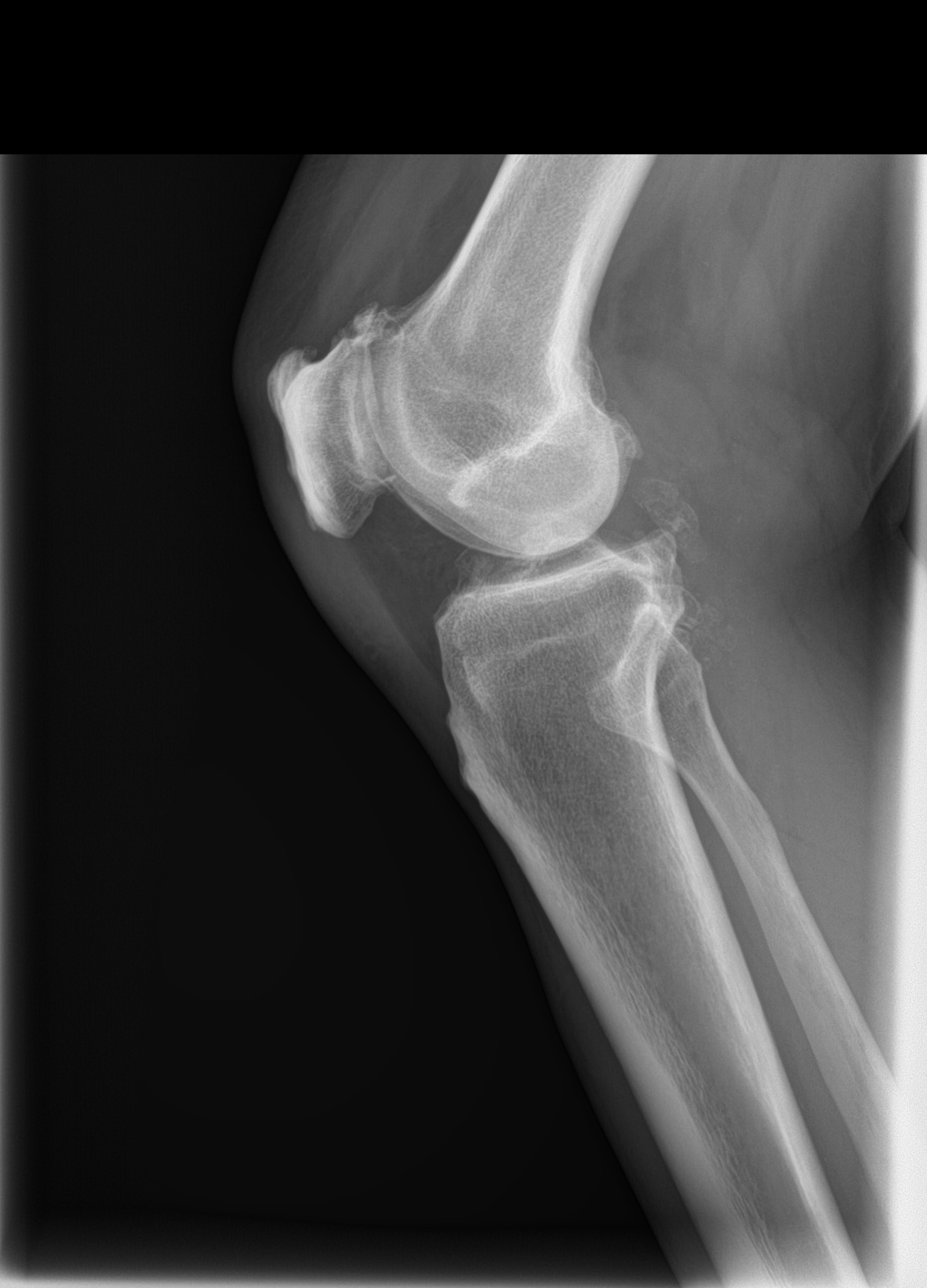

[patella skyline]
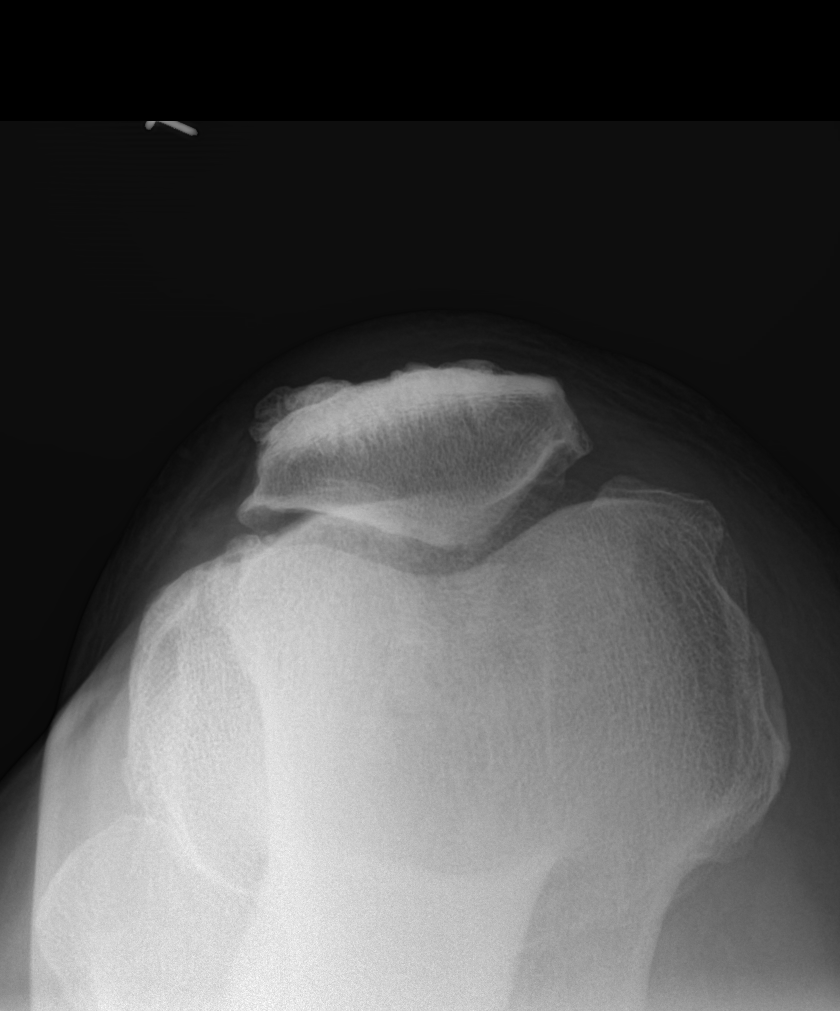

[knee ap (2 of 2)]
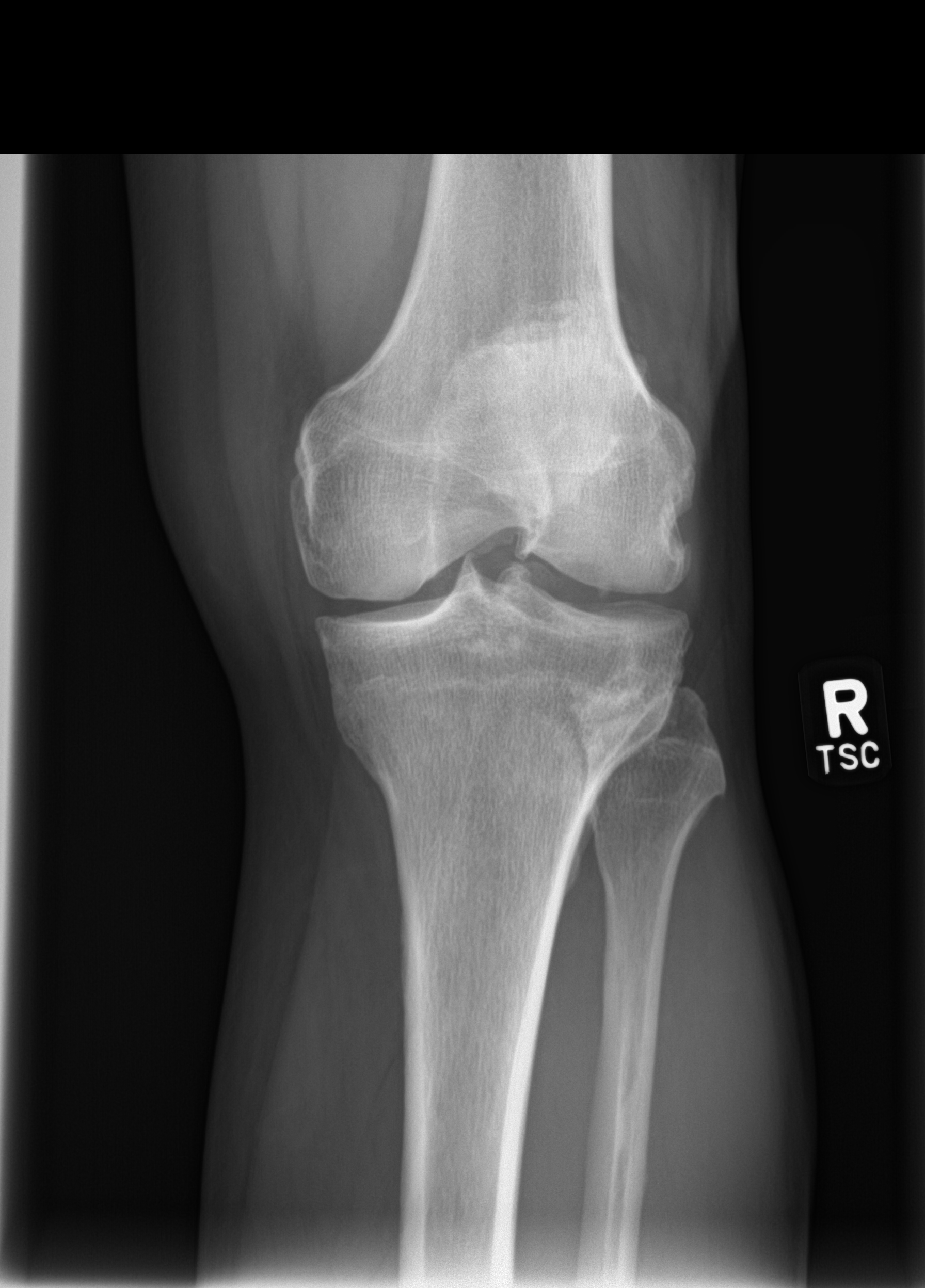

[4 of 4 positions shown; findings below may reference images not displayed]

FINDINGS: No fracture, joint effusion, dislocation or suspicious focal osseous
lesion. Small to moderate superior right patellar enthesophyte.
Moderate osteoarthritis of the patellofemoral compartment with bulky
superior marginal osteophytes. Mild osteoarthritis in the lateral
compartment. Preserved medial compartment. Mild prepatellar soft
tissue swelling.
IMPRESSION: 1. Mild prepatellar soft tissue swelling with no fracture, joint
effusion or malalignment.
2. Moderate patellofemoral compartment and mild lateral compartment
osteoarthritis in the right knee joint.

## 2016-06-08 IMAGING — DX DG TIBIA/FIBULA 2V*R*
2 series · 2 of 2 positions shown · non-contrast
Comparison: Right knee radiographs obtained at the same time.

CLINICAL DATA: Right lower leg pain following a twisting injury 1
week ago.

EXAM:
RIGHT TIBIA AND FIBULA - 2 VIEW

[tibia ap]
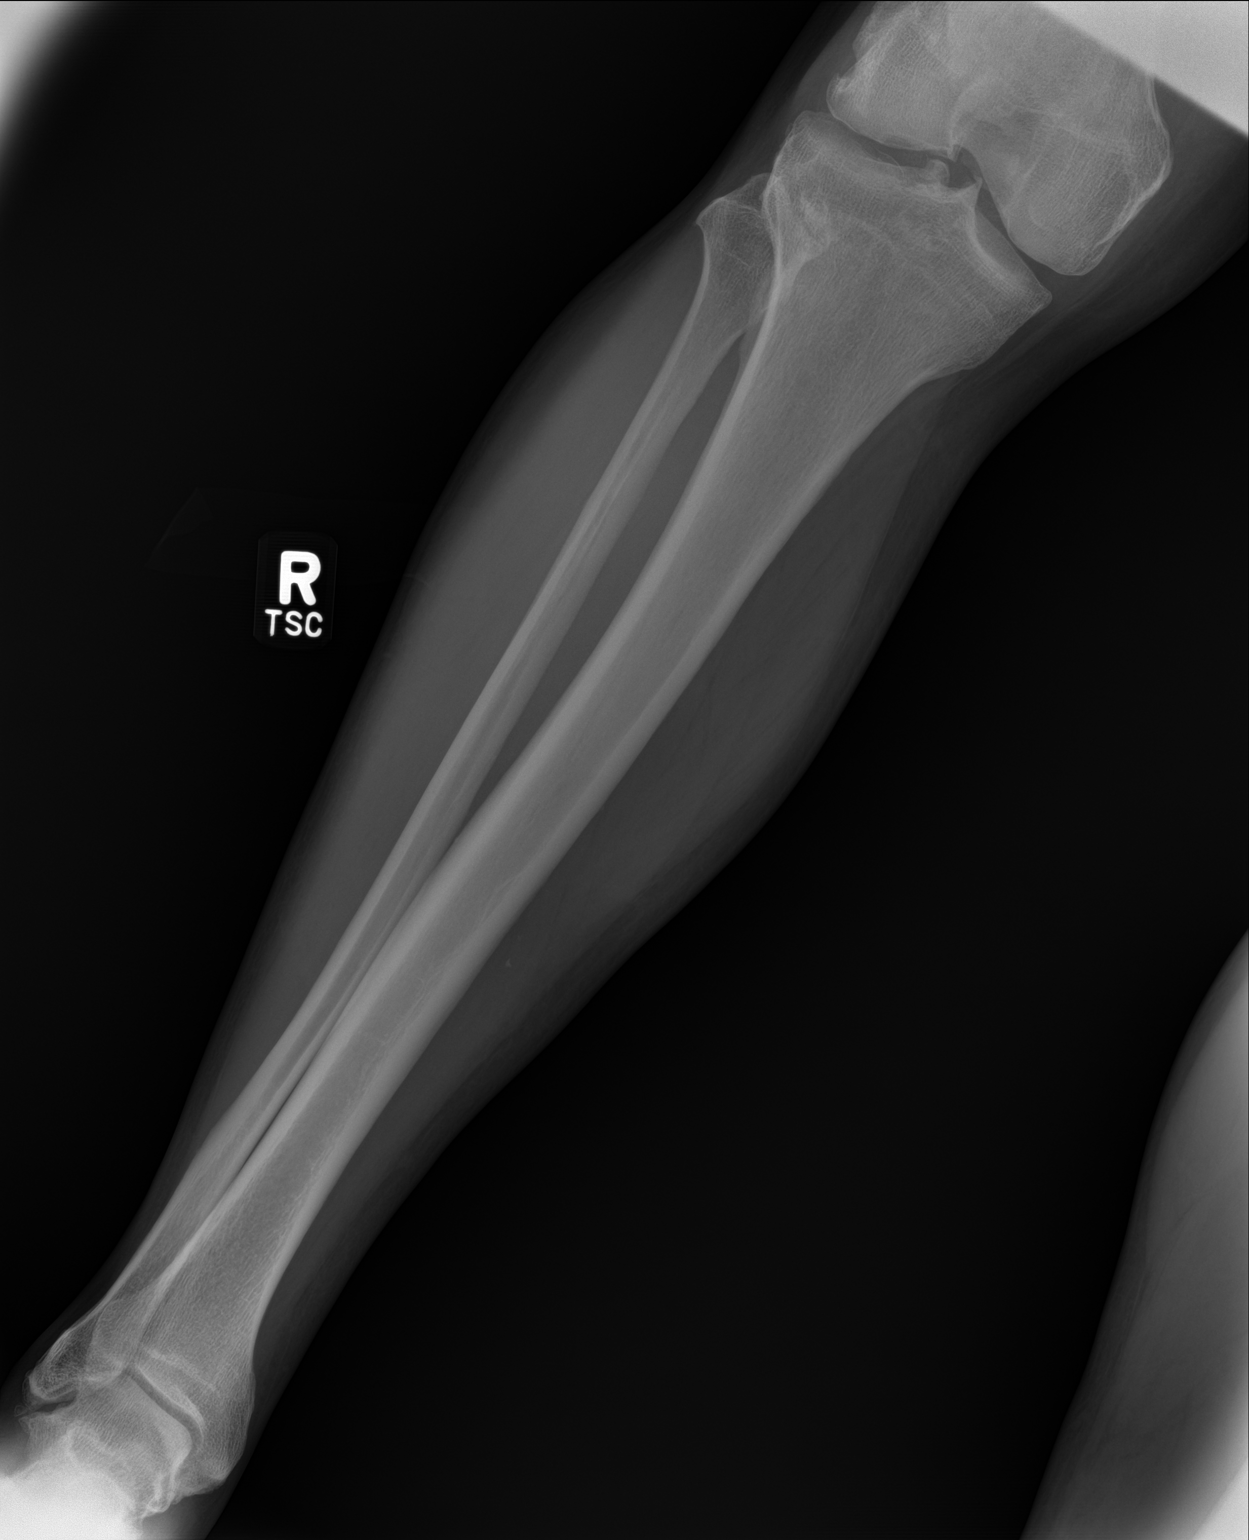

[tibia lat]
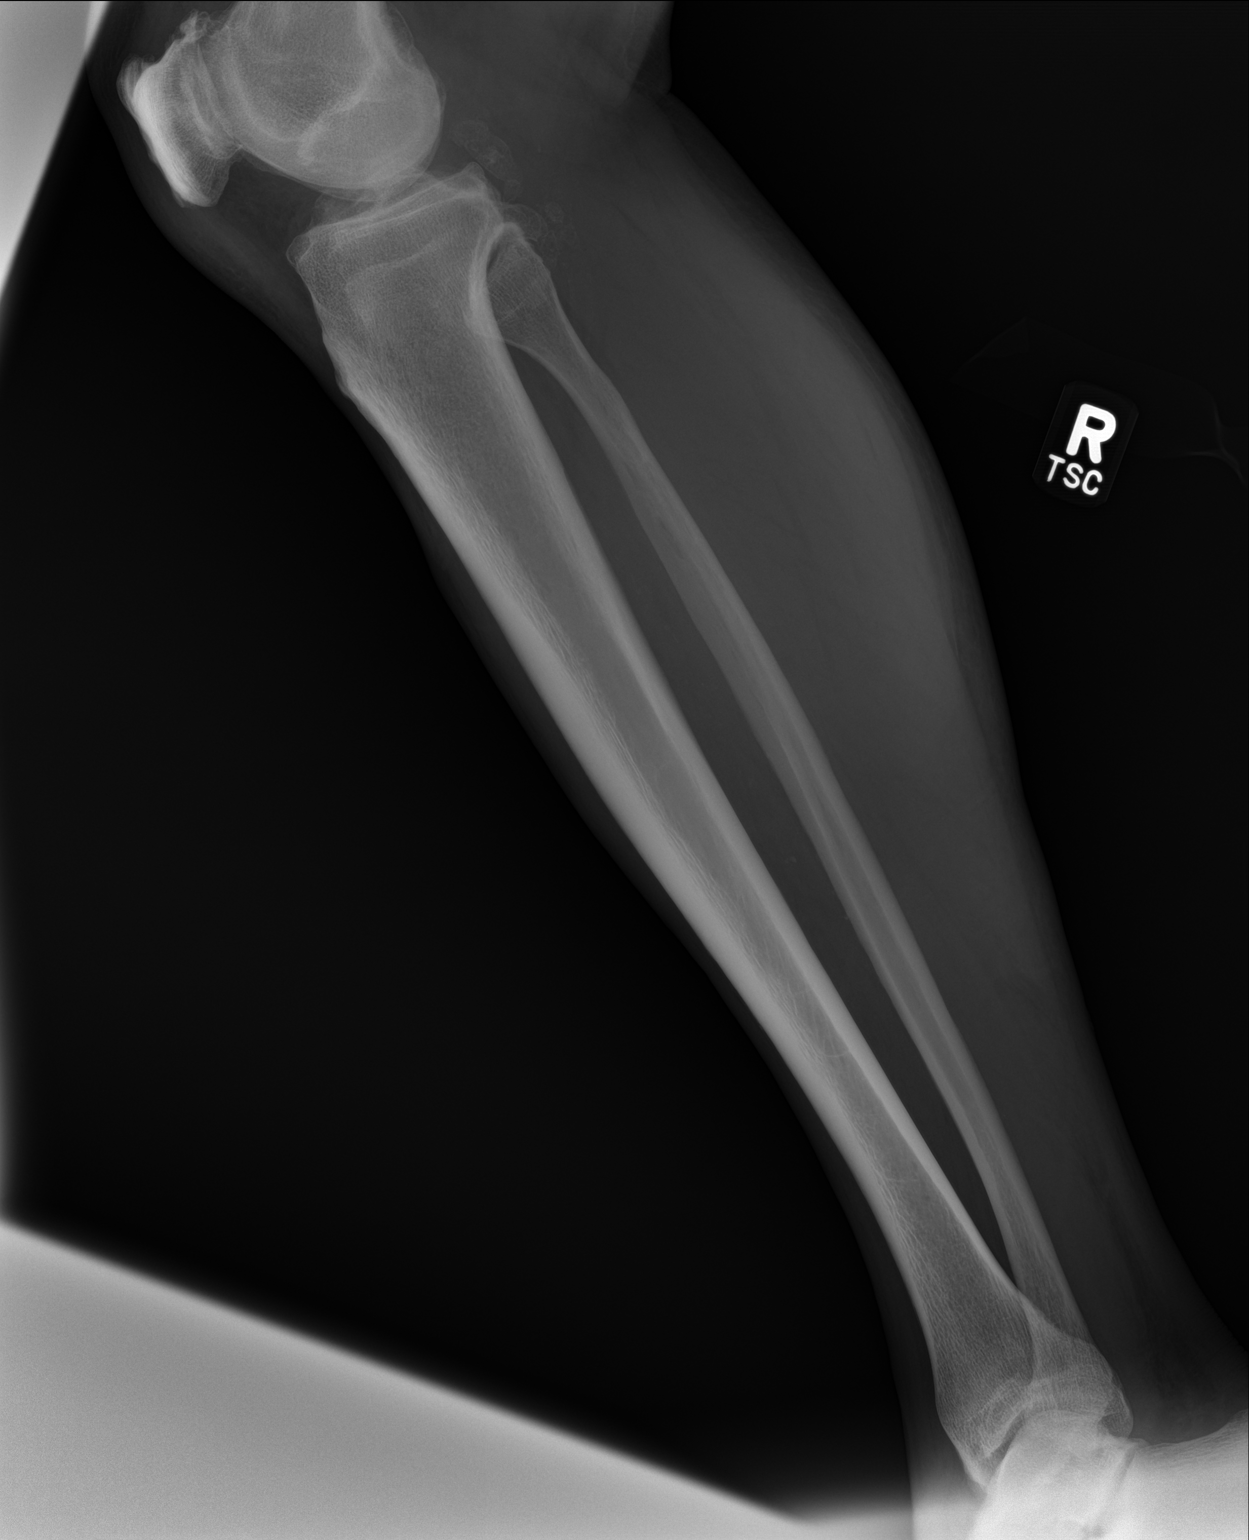

[2 of 2 positions shown; findings below may reference images not displayed]

FINDINGS: Several E described right knee degenerative changes with posterior
loose bodies. Fibular talar degenerative changes are also noted. No
fracture or dislocation is seen.
IMPRESSION: 1. No fracture.
2. Right knee and ankle degenerative changes.

## 2016-07-08 DIAGNOSIS — M791 Myalgia: Secondary | ICD-10-CM | POA: Diagnosis not present

## 2016-07-08 DIAGNOSIS — M48061 Spinal stenosis, lumbar region without neurogenic claudication: Secondary | ICD-10-CM | POA: Diagnosis not present

## 2016-07-08 DIAGNOSIS — M5136 Other intervertebral disc degeneration, lumbar region: Secondary | ICD-10-CM | POA: Diagnosis not present

## 2016-07-08 DIAGNOSIS — S7001XA Contusion of right hip, initial encounter: Secondary | ICD-10-CM | POA: Diagnosis not present

## 2016-07-15 ENCOUNTER — Ambulatory Visit (INDEPENDENT_AMBULATORY_CARE_PROVIDER_SITE_OTHER): Payer: Medicare Other | Admitting: Internal Medicine

## 2016-07-15 ENCOUNTER — Encounter: Payer: Self-pay | Admitting: Internal Medicine

## 2016-07-15 VITALS — BP 130/72 | HR 58 | Ht 67.5 in | Wt 184.0 lb

## 2016-07-15 DIAGNOSIS — E1159 Type 2 diabetes mellitus with other circulatory complications: Secondary | ICD-10-CM | POA: Diagnosis not present

## 2016-07-15 LAB — POCT GLYCOSYLATED HEMOGLOBIN (HGB A1C): Hemoglobin A1C: 6.7

## 2016-07-15 MED ORDER — CANAGLIFLOZIN 300 MG PO TABS: 300.0000 mg | ORAL_TABLET | Freq: Every morning | ORAL | 3 refills | Status: DC

## 2016-07-15 MED ORDER — GLIPIZIDE ER 5 MG PO TB24: ORAL_TABLET | ORAL | 3 refills | Status: DC

## 2016-07-15 MED ORDER — SITAGLIPTIN PHOS-METFORMIN HCL 50-1000 MG PO TABS: 2.0000 | ORAL_TABLET | ORAL | 3 refills | Status: DC

## 2016-07-15 NOTE — Patient Instructions (Addendum)
Please continue: - Janumet XR 1000-50 mg 2 tablets in am - Glipizide ER 5 mg in am and 10 mg in pm - Invokana 300 mg in am   Please skip Invokana when you are working out in the yard.  Please return in 4 months with your sugar log.

## 2016-07-15 NOTE — Progress Notes (Signed)
Patient ID: William Walton, male   DOB: [birth year], 76 y.o.   MRN: [id number]  HPI: William Walton is a 76 y.o.-year-old male, returning for f/u for DM2, dx 2010, non-insulin-dependent, uncontrolled, with complications (CAD - s/p CABG, peripheral neuropathy, ED), likely 2/2 Agent Orange. Also goes to the [place] in [place] now (previously was going to [place]). Last visit with me 4 mo ago. PCP: [name].  He had syncope 2/2 BP meds in 11/2015 (meds adjusted then) and then again went to the ED with dehydration 03/2016. sBP was in the 70s.  He gained 9 lbs in last 3 mo. He had pbs with back pan >> less activity. He got steroid shots.   HbA1C levels: Lab Results  Component Value Date   HGBA1C 6.6 03/16/2016   HGBA1C 6.4 11/15/2015   HGBA1C 7.1 08/15/2015  02/20/2011: A1c 7.1% 05/22/2011: A1c 6.8%, after which he was taken off the insulin >> A1C started to increase.  11/19/2011: A1c 8.5%, TSH 2.18, BUN/creatinine 16/1.0, ACR 3.7 02/24/2012: A1c 8.4%, vitamin B12 321 06/01/2012: 7.6% 01/26/2013: 7.3% - received records from [place]   Pt is on a regimen of: - Janumet XR 1000-50 mg x 2 in am - Glipizide ER 5 mg in am and 10 mg in pm - Invokana 300 mg daily  Pt checks his sugars 2x a day >> reviewed detailed log: - am:   84-127, 132 >> 86, 91-129, 145 >> 85-125, 129 >> 84, 92-133, 155 - 2h after b'fast: 113 >> n/c >> 136 >> 166 >> 162 >> 168 >> n/c - before lunch:  140 >> 80, 139, 149 >> n/c >> 102 >> n/c  - 2h after lunch:  78 >> n/c >> 103 >> 107-184 >> n/c - before dinner: 117, 144 >> 94-137, 146 >> 116, 135 >> 80-111 - 2h after dinner: 135-189, 239 >> 108-188 >> 109-168, 177, 181 >>91-180 - bedtime: 151-185, 220 >> 148, 152 >> 159 >> 119, 121 >> n/c >> 126 - at 11 pm: 90 >> 90, 118, 181 >> 167 >> n/c >> 101 No lows. Lowest sugar was 86 >> 85 >> 80; he has hypoglycemia awareness at 70.  Highest sugar was 188 >> 181 >> 180  He goes to the gym 3x a week.  - has  mild CKD. Last BUN/creatinine:  Lab Results  Component Value Date   BUN 24 (H) 04/02/2016   BUN 15 01/20/2016   CREATININE 1.70 (H) 04/02/2016   CREATININE 1.02 01/20/2016  19/1.2 (GFR 63.4) in 01/26/2013  16/1.0 in 05/2012.  He is on Lisinopril 2.5 mg daily.  - He has HL. Last set of lipids:  Lab Results  Component Value Date   CHOL 265 (H) 01/20/2016   HDL 32.20 (L) 01/20/2016   LDLCALC 194 (H) 01/20/2016   LDLDIRECT 141.0 02/07/2016   TRIG 192.0 (H) 01/20/2016   CHOLHDL 8 01/20/2016  212/249/29/106 (01/26/2013)  200/214/26.8/130.4  On Crestor 20 mg daily.  He is on ASA 81.  - last eye exam was:  Abstract on 08/07/2015  Component Date Value Ref Range Status  . HM Diabetic Eye Exam- [name].  07/31/2015 No Retinopathy  No Retinopathy Final   - He has neuropathy >> on Neurontin 300 mg tic  + generic Metanx   He also has a history of PTSD ([employer] veteran), HTN, HL, h/o L TKR.   ROS: Constitutional: + weight gain/no weight loss, no fatigue, no subjective hyperthermia, no subjective hypothermia  Eyes: no blurry vision, no xerophthalmia ENT: no sore throat, no nodules palpated in throat, no dysphagia, no odynophagia, no hoarseness Cardiovascular: no CP/no SOB/no palpitations/no leg swelling Respiratory: no cough/no SOB/no wheezing Gastrointestinal: no N/no V/no D/no C/no acid reflux Musculoskeletal: no muscle aches/no joint aches Skin: no rashes, no hair loss Neurological: no tremors/no numbness/no tingling/no dizziness  I reviewed pt's medications, allergies, PMH, social hx, family hx, and changes were documented in the history of present illness. Otherwise, unchanged from my initial visit note.  PE: BP 130/72 (BP Location: Left Arm, Patient Position: Sitting)   Pulse (!) 58   Ht 5' 7.5" (1.715 m)   Wt 184 lb (83.5 kg)   SpO2 97%   BMI 28.39 kg/m  Body mass index is 28.39 kg/m.  Wt Readings from Last 3 Encounters:  07/15/16 184 lb (83.5 kg)  05/22/16 181 lb  8 oz (82.3 kg)  04/02/16 175 lb (79.4 kg)   Constitutional: normal weight, in NAD Eyes: PERRLA, EOMI, no exophthalmos ENT: moist mucous membranes, no thyromegaly, no cervical lymphadenopathy Cardiovascular: RRR, No MRG Respiratory: CTA B Gastrointestinal: abdomen soft, NT, ND, BS+ Musculoskeletal: no deformities, strength intact in all 4 Skin: moist, warm, no rashes Neurological: no tremor with outstretched hands, DTR normal in all 4  ASSESSMENT: 1. DM2, non-insulin-dependent, uncontrolled, with complications - CAD, s/p AMI and 5v CABG at [facility] 08/29/2010 - Peripheral neuropathy - ED  2. PN  PLAN:  1. Patient with long-standing, fairly well controlled DM, with good sugars despite stopping exercise recently 2/2 back pain. He had 2 steroid shots since last visit >> sugars did not increase too much.  - we discussed about his dehydration episodes >> they appear only when he works out in the yard >> I advised him to not take Invokana if he plans to do so. This is usually once out of 7 days. - a good HbA1c target for him of <7.5% due to age - I suggested to continue current regimen:  Patient Instructions  Please continue: - Janumet XR 1000-50 mg 2 tablets in am - Glipizide ER 5 mg in am and 10 mg in pm - Invokana 300 mg in am   Please return in 4 months with your sugar log.   - today, HbA1c is 6.7%  - continue checking sugars at different times of the day - check 1-2x a day, rotating checks - advised for yearly eye exams >> he is UTD - Return to clinic in 4 mo with sugar log    2. Peripheral neuropathy - on Neurontin 30 mg tid + generic Metanx - denies tingling/burnign in her feet  [name], MD PhD [facility] Endocrinology

## 2016-07-15 NOTE — Addendum Note (Signed)
Addended by: Caprice Beaver T on: 07/15/2016 11:25 AM   Modules accepted: Orders

## 2016-10-08 ENCOUNTER — Other Ambulatory Visit: Payer: Self-pay | Admitting: Internal Medicine

## 2016-11-16 ENCOUNTER — Ambulatory Visit: Payer: Medicare Other | Admitting: Internal Medicine

## 2016-12-07 ENCOUNTER — Encounter: Payer: Self-pay | Admitting: Internal Medicine

## 2016-12-07 ENCOUNTER — Ambulatory Visit (INDEPENDENT_AMBULATORY_CARE_PROVIDER_SITE_OTHER): Payer: Medicare Other | Admitting: Internal Medicine

## 2016-12-07 VITALS — BP 130/70 | HR 60 | Wt 188.0 lb

## 2016-12-07 DIAGNOSIS — G63 Polyneuropathy in diseases classified elsewhere: Secondary | ICD-10-CM

## 2016-12-07 DIAGNOSIS — E1159 Type 2 diabetes mellitus with other circulatory complications: Secondary | ICD-10-CM

## 2016-12-07 LAB — POCT GLYCOSYLATED HEMOGLOBIN (HGB A1C): Hemoglobin A1C: 6.7

## 2016-12-07 NOTE — Progress Notes (Signed)
Patient ID: William Walton, male   DOB: 1940/06/15, 76 y.o.   MRN: [id number]  HPI: William Walton is a 76 y.o.-year-old male, returning for f/u for DM2, dx 2010, non-insulin-dependent, uncontrolled, with complications (CAD - s/p CABG, peripheral neuropathy, ED), likely 2/2 Agent Orange. He goes to [facility] in [place] now. (previously was going to [place]). Last visit with me 4 mo ago. PCP: [name].  He has a h/o back steroid inj's, no recentl  HbA1C levels: Lab Results  Component Value Date   HGBA1C 6.7 07/15/2016   HGBA1C 6.6 03/16/2016   HGBA1C 6.4 11/15/2015  02/20/2011: A1c 7.1% 05/22/2011: A1c 6.8%, after which he was taken off the insulin >> A1C started to increase.  11/19/2011: A1c 8.5%, TSH 2.18, BUN/creatinine 16/1.0, ACR 3.7 02/24/2012: A1c 8.4%, vitamin B12 321 06/01/2012: 7.6% 01/26/2013: 7.3% - received records from [place]   Pt is on a regimen of: - Janumet XR 1000-50 mg 2 tablets in am - Glipizide ER 5 mg in am and 10 mg in pm - Invokana 300 mg in am   Pt checks his sugars 2x a day - brings a good log: - am:  85-125, 129 >> 84, 92-133, 155 >> 81, 83-133, 149 - 2h after b'fast: 166 >> 162 >> 168 >> n/c - before lunch: 80, 139, 149 >> n/c >> 102 >> n/c >> 169 (forgot meds) - 2h after lunch: 103 >> 107-184 >> n/c >> 134 - before dinner: 116, 135 >> 80-111 >> 95-133, 152 - 2h after dinner: 109-168, 177, 181 >>91-180 >> 90-183 - bedtime: 119, 121 >> n/c >> 126 - at 11 pm:  167 >> n/c >> 101 Lowest sugar was 80 >> 81 he has hypoglycemia awareness at 70s.  Highest sugar was 180 >> 183  He goes to the gym less.  - + CKD. Last BUN/creatinine:  Lab Results  Component Value Date   BUN 24 (H) 04/02/2016   BUN 15 01/20/2016   CREATININE 1.70 (H) 04/02/2016   CREATININE 1.02 01/20/2016  19/1.2 (GFR 63.4) in 01/26/2013  16/1.0 in 05/2012.  On Lisniopril 2.5.  - + HL. Last set of lipids:  Lab Results  Component Value Date   CHOL 265 (H)  01/20/2016   HDL 32.20 (L) 01/20/2016   LDLCALC 194 (H) 01/20/2016   LDLDIRECT 141.0 02/07/2016   TRIG 192.0 (H) 01/20/2016   CHOLHDL 8 01/20/2016  212/249/29/106 (01/26/2013)  200/214/26.8/130.4  On Crestor 20.  On ASA 81.  - last eye exam was: 07/31/2015 >> No DR (Dr. Ellin Mayhew). Has one coming up in November.  - + PN >> on Neurontin 300 mg tid + generic Metanx  He also has a history of PTSD ([employer] veteran), HTN, HL, h/o L TKR.   He had syncope 2/2 BP meds in 11/2015 (meds adjusted then) and then again went to the ED with dehydration 03/2016. sBP was in the 70s.  ROS: Constitutional: no weight gain/no weight loss, no fatigue, no subjective hyperthermia, no subjective hypothermia, + nocturia Eyes: + blurry vision, no xerophthalmia ENT: no sore throat, no nodules palpated in throat, no dysphagia, no odynophagia, no hoarseness Cardiovascular: no CP/no SOB/no palpitations/no leg swelling Respiratory: no cough/no SOB/no wheezing Gastrointestinal: no N/no V/no D/no C/no acid reflux Musculoskeletal: no muscle aches/no joint aches Skin: no rashes, no hair loss Neurological: no tremors/no numbness/no tingling/no dizziness  I reviewed pt's medications, allergies, PMH, social hx, family hx, and changes were documented in the history of  present illness. Otherwise, unchanged from my initial visit note.  PE: BP 130/70 (BP Location: Left Arm, Patient Position: Sitting)   Pulse 60   Wt 188 lb (85.3 kg)   SpO2 96%   BMI 29.01 kg/m  Body mass index is 29.01 kg/m.  Wt Readings from Last 3 Encounters:  12/07/16 188 lb (85.3 kg)  07/15/16 184 lb (83.5 kg)  05/22/16 181 lb 8 oz (82.3 kg)   Constitutional: normal weight, in NAD Eyes: PERRLA, EOMI, no exophthalmos ENT: moist mucous membranes, no thyromegaly, no cervical lymphadenopathy Cardiovascular: RRR, No MRG Respiratory: CTA B Gastrointestinal: abdomen soft, NT, ND, BS+ Musculoskeletal: no deformities, strength intact in all  4 Skin: moist, warm, no rashes Neurological: no tremor with outstretched hands, DTR normal in all 4  ASSESSMENT: 1. DM2, non-insulin-dependent, uncontrolled, with complications - CAD, s/p AMI and 5v CABG at [facility] 08/29/2010 - Peripheral neuropathy - ED  2. PN  PLAN:  1. Patient with long-standing, fairly well controlled DM2, with stable control since last visit. No steroid injections since then.  - we reviewed his carefully kept log >> sugars all at or slightly above goal. No lows. We can continue his current regimen. - target HbA1c is <7.5% due to age - I suggested to continue current regimen:  Patient Instructions  Please continue: - Janumet XR 1000-50 mg 2 tablets in am - Glipizide ER 5 mg in am and 10 mg in pm - Invokana 300 mg in am   Please return in 4 months with your sugar log.   - today, HbA1c is 6.7% (stable) - continue checking sugars at different times of the day - check 1x a day, rotating checks - advised for yearly eye exams >> he is due  - eye exam scheduled - Had the flu shot already - Return to clinic in 4 mo with sugar log   2. Peripheral neuropathy - on Neurontin 300 mg 3x a day + generic Metanx - stable  [name], MD PhD [facility] Endocrinology

## 2016-12-07 NOTE — Patient Instructions (Signed)
Please continue: - Janumet XR 1000-50 mg 2 tablets in am - Glipizide ER 5 mg in am and 10 mg in pm - Invokana 300 mg in am   Please return in 4 months with your sugar log.

## 2016-12-07 NOTE — Addendum Note (Signed)
Addended by: Caprice Beaver T on: 12/07/2016 09:35 AM   Modules accepted: Orders

## 2016-12-10 DIAGNOSIS — Z1283 Encounter for screening for malignant neoplasm of skin: Secondary | ICD-10-CM | POA: Diagnosis not present

## 2016-12-10 DIAGNOSIS — D229 Melanocytic nevi, unspecified: Secondary | ICD-10-CM | POA: Diagnosis not present

## 2016-12-10 DIAGNOSIS — L821 Other seborrheic keratosis: Secondary | ICD-10-CM | POA: Diagnosis not present

## 2016-12-10 DIAGNOSIS — L812 Freckles: Secondary | ICD-10-CM | POA: Diagnosis not present

## 2016-12-10 DIAGNOSIS — D1801 Hemangioma of skin and subcutaneous tissue: Secondary | ICD-10-CM | POA: Diagnosis not present

## 2016-12-10 DIAGNOSIS — L578 Other skin changes due to chronic exposure to nonionizing radiation: Secondary | ICD-10-CM | POA: Diagnosis not present

## 2016-12-10 DIAGNOSIS — L570 Actinic keratosis: Secondary | ICD-10-CM | POA: Diagnosis not present

## 2017-04-09 ENCOUNTER — Ambulatory Visit: Payer: Medicare Other | Admitting: Internal Medicine

## 2017-04-30 ENCOUNTER — Encounter: Payer: Self-pay | Admitting: Internal Medicine

## 2017-04-30 ENCOUNTER — Ambulatory Visit (INDEPENDENT_AMBULATORY_CARE_PROVIDER_SITE_OTHER): Payer: Medicare Other | Admitting: Internal Medicine

## 2017-04-30 VITALS — BP 142/84 | HR 88 | Ht 67.5 in | Wt 188.2 lb

## 2017-04-30 DIAGNOSIS — G629 Polyneuropathy, unspecified: Secondary | ICD-10-CM | POA: Insufficient documentation

## 2017-04-30 DIAGNOSIS — E1159 Type 2 diabetes mellitus with other circulatory complications: Secondary | ICD-10-CM | POA: Diagnosis not present

## 2017-04-30 DIAGNOSIS — G63 Polyneuropathy in diseases classified elsewhere: Secondary | ICD-10-CM | POA: Diagnosis not present

## 2017-04-30 DIAGNOSIS — E785 Hyperlipidemia, unspecified: Secondary | ICD-10-CM | POA: Diagnosis not present

## 2017-04-30 LAB — HEMOGLOBIN A1C: HEMOGLOBIN A1C: 7.6 % — AB (ref 4.6–6.5)

## 2017-04-30 NOTE — Progress Notes (Signed)
Patient ID: William Walton, male   DOB: [birth year], 77 y.o.   MRN: [id number]  HPI: William Walton is a 77 y.o.-year-old male, returning for f/u for DM2, dx 2010, non-insulin-dependent, uncontrolled, with complications (CAD - s/p CABG, peripheral neuropathy, ED), likely 2/2 Agent Orange. He goes to [place] in [place] (previously was going to [place]). Last visit with me 5 mo ago. PCP: [name].  In 02/2017, he went in vacation, forgot meds >> off meds for 1 week. Sugars higher.  Also, he is now doing a water fast x 3 days >> off all meds >> sugars a little higher in am >> will restart eating tonight.  HbA1C levels: Lab Results  Component Value Date   HGBA1C 6.7 12/07/2016   HGBA1C 6.7 07/15/2016   HGBA1C 6.6 03/16/2016  02/20/2011: A1c 7.1% 05/22/2011: A1c 6.8%, after which he was taken off the insulin >> A1C started to increase.  11/19/2011: A1c 8.5%, TSH 2.18, BUN/creatinine 16/1.0, ACR 3.7 02/24/2012: A1c 8.4%, vitamin B12 321 06/01/2012: 7.6% 01/26/2013: 7.3% - received records from [place]   Pt is on a regimen of: - Janumet XR 1000-50 mg 2 tablets in am - Glipizide ER 5 mg in am and 10 mg in pm - Invokana 300 mg in am   Pt checks his sugars 2x a day - reviewed his log:: - am:81, 83-133, 149 >> 92, 94, 107-142, 176, 193 (higher during the last 3 days - fasting, just water, no meds) - 2h after b'fast: 166 >> 162 >> 168 >> n/c - before lunch: 102 >> n/c >> 169 (forgot meds) >> n/c - 2h after lunch: 107-184 >> n/c >> 134 >> 151 - before dinner: 80-111 >> 95-133, 152 >> 92, 138 - 2h after dinner: 91-180 >> 90-183 >> 107-185 - bedtime: 119, 121 >> n/c >> 126 >> 129-152, 185 - at 11 pm:  167 >> n/c >> 101 >> 111 Lowest sugar was 80 >> 81 >> ; he has hypoglycemia awareness in the 70s. Highest sugar was 180 >> 183 >> 281 (off meds - vacation).   - + CKD. Last BUN/creatinine:  Lab Results  Component Value Date   BUN 24 (H) 04/02/2016   BUN 15 01/20/2016   CREATININE 1.70 (H) 04/02/2016   CREATININE 1.02 01/20/2016  19/1.2 (GFR 63.4) in 01/26/2013  16/1.0 in 05/2012.  On Lisinopril 2.5.  - + HL. Last set of lipids:  Lab Results  Component Value Date   CHOL 265 (H) 01/20/2016   HDL 32.20 (L) 01/20/2016   LDLCALC 194 (H) 01/20/2016   LDLDIRECT 141.0 02/07/2016   TRIG 192.0 (H) 01/20/2016   CHOLHDL 8 01/20/2016  212/249/29/106 (01/26/2013)  200/214/26.8/130.4  On Crestor 20  On ASA 81.  - last eye exam was: 12/2016: No DR (Dr. Ellin Mayhew).   - + neuropathy >> on Neurontin 300 tid + generic Metanx   He also has a history of PTSD ([employer] veteran), HTN, h/o L TKR.   He had syncope 2/2 BP meds in 11/2015 (meds adjusted then) and then again went to the ED with dehydration 03/2016. sBP was in the 70s.  ROS: Constitutional: no weight gain/no weight loss, no fatigue, no subjective hyperthermia, no subjective hypothermia, + nocturia Eyes: no blurry vision, no xerophthalmia ENT: no sore throat, no nodules palpated in throat, no dysphagia, no odynophagia, no hoarseness Cardiovascular: no CP/no SOB/no palpitations/no leg swelling Respiratory: no cough/no SOB/no wheezing Gastrointestinal: no N/no V/no D/no C/no acid reflux Musculoskeletal:  no muscle aches/no joint aches Skin: no rashes, no hair loss Neurological: no tremors/no numbness/no tingling/no dizziness  I reviewed pt's medications, allergies, PMH, social hx, family hx, and changes were documented in the history of present illness. Otherwise, unchanged from my initial visit note.  Past Medical History:  Diagnosis Date  . Arthritis   . CAD (coronary artery disease) 08/2010   STEMI 08/2010 s/p 5v CABG at [facility], cardiac rehab - only did 7/26 sessions  . Controlled type 2 diabetes mellitus with circulatory disorder ([place]) 2012   Atira Borello  . ED (erectile dysfunction)    likely medication induced  . HLD (hyperlipidemia)   . HTN (hypertension)   . Hypertension   . Mild carotid artery  disease ([place]) 10/19/2015   Mild plaque build-up by [place] 10/2015, monitor clinically   . Spondyloarthropathy ([place]) 05/2014   by Odette Horns   Past Surgical History:  Procedure Laterality Date  . CATARACT EXTRACTION  11/2010  . CORONARY ARTERY BYPASS GRAFT  08/2010   5v, [facility] Dr. Julaine Hua  . ESOPHAGOGASTRODUODENOSCOPY  08/2010   normal esophagus, localized gastric erythema  . TOTAL KNEE ARTHROPLASTY Left 08/2012   [facility] at [place]  . US ECHOCARDIOGRAPHY  08/2010   mild LV dysfxn (EF 45%) with severe LVH, nl R vent sys fxn   Social History   Socioeconomic History  . Marital status: Married    Spouse name: Not on file  . Number of children: 2  . Years of education: 4 yr colle  . Highest education level: Not on file  Social Needs  . Financial resource strain: Not on file  . Food insecurity - worry: Not on file  . Food insecurity - inability: Not on file  . Transportation needs - medical: Not on file  . Transportation needs - non-medical: Not on file  Occupational History  . Occupation: retired Lobbyist: RETIRED  Tobacco Use  . Smoking status: Former Smoker    Last attempt to quit: 02/16/1966    Years since quitting: 51.2  . Smokeless tobacco: Never Used  Substance and Sexual Activity  . Alcohol use: No  . Drug use: No  . Sexual activity: No  Other Topics Concern  . 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 amoxicillin (AMOXIL) 500 MG capsule Take 2,000 mg by mouth daily. Take one hour prior to dental treatment    . aspirin 81 MG tablet Take 81 mg by mouth every morning.     .  canagliflozin (INVOKANA) 300 MG TABS tablet Take 1 tablet (300 mg total) by mouth every morning. 90 tablet 3  . FREESTYLE LITE test strip USE TO TEST BLOOD SUGAR TWICE A DAY AS INSTRUCTED 200 each 2  . gabapentin (NEURONTIN) 300 MG capsule Take 900 mg by mouth 3 (three) times daily.    Marland Kitchen glipiZIDE (GLIPIZIDE XL) 5 MG 24 hr tablet TAKE 1 TABLET IN THE MORNING AND 2 TABLETS IN THE EVENING 270 tablet 3  . JANUMET XR 50-1000 MG TB24 TAKE 1 TABLET TWICE A DAY 180 tablet 0  . L-Methylfolate-B6-B12 Lebron Quam)  3-35-2 MG TABS TAKE 1 TABLET TWICE A DAY 2 tablet 2  . metoprolol tartrate (LOPRESSOR) 25 MG tablet Take 12.5 mg by mouth 2 (two) times daily.     Marland Kitchen omeprazole (PRILOSEC) 40 MG capsule Take 1 capsule (40 mg total) by mouth daily. 30 capsule 1  . prazosin (MINIPRESS) 1 MG capsule Take 1 mg by mouth at bedtime.    . ramipril (ALTACE) 5 MG capsule Take 5 mg by mouth daily.    . rosuvastatin (CRESTOR) 40 MG tablet Take 1 tablet (40 mg total) by mouth daily. 90 tablet 1  . sertraline (ZOLOFT) 100 MG tablet Take 100 mg by mouth daily.    . sitaGLIPtin-metformin (JANUMET) 50-1000 MG tablet Take 2 tablets by mouth every morning. 180 tablet 3   No current facility-administered medications on file prior to visit.    No Known Allergies Family History  Problem Relation Age of Onset  . Cancer Father 11       died of prostate or bladder CA, pt unsure  . Coronary artery disease Brother 59       bypass  . Hyperlipidemia Brother   . Hypertension Brother   . Coronary artery disease Maternal Uncle 59       MI  . Diabetes Neg Hx   . Stroke Neg Hx     PE: BP (!) 142/84   Pulse 88   Ht 5' 7.5" (1.715 m)   Wt 188 lb 3.2 oz (85.4 kg)   SpO2 96%   BMI 29.04 kg/m  Body mass index is 29.04 kg/m.  Wt Readings from Last 3 Encounters:  04/30/17 188 lb 3.2 oz (85.4 kg)  12/07/16 188 lb (85.3 kg)  07/15/16 184 lb (83.5 kg)   Constitutional: overweight, in NAD Eyes: PERRLA, EOMI, no exophthalmos ENT:  moist mucous membranes, no thyromegaly, no cervical lymphadenopathy Cardiovascular: RRR, No MRG Respiratory: CTA B Gastrointestinal: abdomen soft, NT, ND, BS+ Musculoskeletal: no deformities, strength intact in all 4 Skin: moist, warm, no rashes Neurological: no tremor with outstretched hands, DTR normal in all 4  ASSESSMENT: 1. DM2, non-insulin-dependent, uncontrolled, with complications - CAD, s/p AMI and 5v CABG at [facility] 08/29/2010 - Peripheral neuropathy - ED  2. PN  3. HL  PLAN:  1. Patient with long standing, now fairly well controlled DM2,  With stable ctrl since last visit. No recent steroid inj's. At last visit, we did not change the regimen as his sugars were mostly at goal with few hyperglycemic spikes. - he has the same pattern at this visit, but sugars higher overall during vacation (1 week in 02/2017) as he was off meds. - also, during his fast, sugars are a little higher in am >> will stop tonight and resume his meds - target HbA1c is <7.5% due to age - I suggested to continue current regimen:  Patient Instructions  Please bring me the labs from the [place] at next visit.  Please stop at the lab.  Please continue: - Janumet XR 1000-50 mg 2 tablets in am - Glipizide ER 5 mg in am and 10 mg in pm - Invokana 300 mg in am   Please return in 4 months with your sugar log.   - today will check an HbA1c - continue checking sugars at different times of the day - check 1x a day, rotating checks - advised for yearly eye exams >> he is UTD - Return to clinic in 4 mo with sugar log   2. Peripheral neuropathy -  continues on Neurontin 300 tid + generic Metanx  - stable  3. HL - reviewed Lipids from 2017: elevated LDL, TG - continues Crestor 20 w/o SEs - he had a Lipid panel at the [place] 3 mo ago >> will bring me the records at next visit  Office Visit on 04/30/2017  Component Date Value Ref Range Status  . Hgb A1c MFr Bld 04/30/2017 7.6* 4.6 - 6.5 % Final   Glycemic  Control Guidelines for People with Diabetes:Non Diabetic:  <6%Goal of Therapy: <7%Additional Action Suggested:  >8%    HbA1c is higher, as suspected. [name], MD PhD [facility] Endocrinology

## 2017-04-30 NOTE — Patient Instructions (Addendum)
Please bring me the labs from the [place] at next visit.  Please stop at the lab.  Please continue: - Janumet XR 1000-50 mg 2 tablets in am - Glipizide ER 5 mg in am and 10 mg in pm - Invokana 300 mg in am   Please return in 4 months with your sugar log.

## 2017-06-02 ENCOUNTER — Other Ambulatory Visit: Payer: Self-pay | Admitting: Internal Medicine

## 2017-08-30 ENCOUNTER — Ambulatory Visit: Payer: Medicare Other | Admitting: Internal Medicine

## 2017-09-20 ENCOUNTER — Other Ambulatory Visit: Payer: Self-pay

## 2017-12-10 ENCOUNTER — Ambulatory Visit: Payer: Medicare Other | Admitting: Internal Medicine

## 2017-12-23 ENCOUNTER — Ambulatory Visit: Payer: Medicare Other

## 2017-12-27 ENCOUNTER — Emergency Department
Admission: EM | Admit: 2017-12-27 | Discharge: 2017-12-27 | Disposition: A | Discharge status: Home / Self Care | Payer: Medicare Other | Attending: Emergency Medicine | Admitting: Emergency Medicine

## 2017-12-27 ENCOUNTER — Other Ambulatory Visit: Payer: Self-pay

## 2017-12-27 ENCOUNTER — Emergency Department: Payer: Medicare Other

## 2017-12-27 DIAGNOSIS — Y92007 Garden or yard of unspecified non-institutional (private) residence as the place of occurrence of the external cause: Secondary | ICD-10-CM | POA: Insufficient documentation

## 2017-12-27 DIAGNOSIS — Z87891 Personal history of nicotine dependence: Secondary | ICD-10-CM | POA: Diagnosis not present

## 2017-12-27 DIAGNOSIS — S0990XA Unspecified injury of head, initial encounter: Secondary | ICD-10-CM | POA: Diagnosis not present

## 2017-12-27 DIAGNOSIS — W19XXXA Unspecified fall, initial encounter: Secondary | ICD-10-CM

## 2017-12-27 DIAGNOSIS — S0993XA Unspecified injury of face, initial encounter: Secondary | ICD-10-CM | POA: Diagnosis not present

## 2017-12-27 DIAGNOSIS — I2510 Atherosclerotic heart disease of native coronary artery without angina pectoris: Secondary | ICD-10-CM | POA: Diagnosis not present

## 2017-12-27 DIAGNOSIS — E785 Hyperlipidemia, unspecified: Secondary | ICD-10-CM | POA: Insufficient documentation

## 2017-12-27 DIAGNOSIS — W010XXA Fall on same level from slipping, tripping and stumbling without subsequent striking against object, initial encounter: Secondary | ICD-10-CM | POA: Insufficient documentation

## 2017-12-27 DIAGNOSIS — Y9389 Activity, other specified: Secondary | ICD-10-CM | POA: Diagnosis not present

## 2017-12-27 DIAGNOSIS — Z8673 Personal history of transient ischemic attack (TIA), and cerebral infarction without residual deficits: Secondary | ICD-10-CM | POA: Insufficient documentation

## 2017-12-27 DIAGNOSIS — S199XXA Unspecified injury of neck, initial encounter: Secondary | ICD-10-CM | POA: Diagnosis not present

## 2017-12-27 DIAGNOSIS — Z79899 Other long term (current) drug therapy: Secondary | ICD-10-CM | POA: Diagnosis not present

## 2017-12-27 DIAGNOSIS — Z7984 Long term (current) use of oral hypoglycemic drugs: Secondary | ICD-10-CM | POA: Insufficient documentation

## 2017-12-27 DIAGNOSIS — Z7982 Long term (current) use of aspirin: Secondary | ICD-10-CM | POA: Insufficient documentation

## 2017-12-27 DIAGNOSIS — Y998 Other external cause status: Secondary | ICD-10-CM | POA: Insufficient documentation

## 2017-12-27 DIAGNOSIS — Z23 Encounter for immunization: Secondary | ICD-10-CM | POA: Insufficient documentation

## 2017-12-27 DIAGNOSIS — I10 Essential (primary) hypertension: Secondary | ICD-10-CM | POA: Insufficient documentation

## 2017-12-27 DIAGNOSIS — E119 Type 2 diabetes mellitus without complications: Secondary | ICD-10-CM | POA: Diagnosis not present

## 2017-12-27 MED ORDER — TETANUS-DIPHTH-ACELL PERTUSSIS 5-2.5-18.5 LF-MCG/0.5 IM SUSP
0.5000 mL | Freq: Once | INTRAMUSCULAR | Status: AC
  Administered 2017-12-27: 0.5 mL via INTRAMUSCULAR
  Filled 2017-12-27: qty 0.5

## 2017-12-27 MED ORDER — TETANUS-DIPHTHERIA TOXOIDS TD 5-2 LFU IM INJ: 0.5000 mL | INJECTION | Freq: Once | INTRAMUSCULAR | Status: DC

## 2017-12-27 MED ORDER — BACITRACIN ZINC 500 UNIT/GM EX OINT
TOPICAL_OINTMENT | Freq: Every day | CUTANEOUS | Status: DC
  Administered 2017-12-27: 1 via TOPICAL
  Filled 2017-12-27: qty 0.9

## 2017-12-27 NOTE — ED Triage Notes (Signed)
Fell in yard when he backed up and fell in ditch.  Says hit head on rock.  Wound left forehead.

## 2017-12-27 NOTE — ED Notes (Signed)
Spoke with pt about wait times and what to expect next. Advised pt that I am available for further questions if needed.  

## 2017-12-27 NOTE — ED Triage Notes (Signed)
Pt states he was working in his yard at home today and tripped and fell in the ditch. Pt has a lac to the forehead and hematoma below the left eye. Denies LOC. Is not on any blood thinners. Denies any other neuro sx at this time.,

## 2017-12-27 NOTE — ED Notes (Signed)
Pt c/o fall this afternoon pt lost balance, fell and hit head on large rock. Hematoma under right eye, abrasion to right forehead. Pt denies being on blood thinners.

## 2017-12-27 NOTE — ED Provider Notes (Signed)
[facility] Emergency Department Provider Note  Time seen: 6:27 PM  I have reviewed the triage vital signs and the nursing notes.   HISTORY  Chief Complaint Fall    HPI William Walton is a 77 y.o. male the past medical history of arthritis, CAD, hypertension, hyperlipidemia, presents to the emergency department after a fall.  According to the patient he was outside working on his property when he tripped and fell in a ditch hitting his head on a rock.  Denies any loss of consciousness.  Patient does have an abrasion to his left forehead and he has a swollen black left eye.  Denies any neck pain or back pain.  Has been ambulatory since the fall without issue.  Came to the emergency department as a precaution.   Past Medical History:  Diagnosis Date  . Arthritis   . CAD (coronary artery disease) 08/2010   STEMI 08/2010 s/p 5v CABG at [facility], cardiac rehab - only did 7/26 sessions  . Controlled type 2 diabetes mellitus with circulatory disorder ([place]) 2012   [place]  . ED (erectile dysfunction)    likely medication induced  . HLD (hyperlipidemia)   . HTN (hypertension)   . Hypertension   . Mild carotid artery disease ([place]) 10/19/2015   Mild plaque build-up by [place] 10/2015, monitor clinically   . Spondyloarthropathy 05/2014   by Xray    Patient Active Problem List   Diagnosis Date Noted  . Peripheral neuropathy 04/30/2017  . Cough 05/22/2016  . Advanced care planning/counseling discussion 02/06/2016  . Mild carotid artery disease ([place]) 10/19/2015  . TIA (transient ischemic attack) 10/02/2015  . Weight loss 10/02/2015  . Injury of right leg 06/14/2015  . Primary osteoarthritis of both knees 05/18/2014  . Spondyloarthropathy 05/18/2014  . Low back pain 05/10/2014  . Controlled type 2 diabetes mellitus with circulatory disorder ([place]) 12/01/2013  . Medicare annual wellness visit, initial 10/21/2010  . ED (erectile dysfunction) 10/21/2010  . HTN (hypertension)   .  HLD (hyperlipidemia)   . CAD (coronary artery disease)     Past Surgical History:  Procedure Laterality Date  . CATARACT EXTRACTION  11/2010  . CORONARY ARTERY BYPASS GRAFT  08/2010   5v, [facility] Dr. Julaine Hua  . ESOPHAGOGASTRODUODENOSCOPY  08/2010   normal esophagus, localized gastric erythema  . TOTAL KNEE ARTHROPLASTY Left 08/2012   [facility] at [place]  . US ECHOCARDIOGRAPHY  08/2010   mild LV dysfxn (EF 45%) with severe LVH, nl R vent sys fxn    Prior to Admission medications   Medication Sig Start Date End Date Taking? Authorizing Provider  amoxicillin (AMOXIL) 500 MG capsule Take 2,000 mg by mouth daily. Take one hour prior to dental treatment    [provider]  aspirin 81 MG tablet Take 81 mg by mouth every morning.     [provider]  FREESTYLE LITE test strip USE TO TEST BLOOD SUGAR TWICE A DAY AS INSTRUCTED 06/07/17   [name], MD  gabapentin (NEURONTIN) 300 MG capsule Take 900 mg by mouth 3 (three) times daily.    [provider]  glipiZIDE (GLIPIZIDE XL) 5 MG 24 hr tablet TAKE 1 TABLET IN THE MORNING AND 2 TABLETS IN THE EVENING 07/15/16   [name], MD  [facility] 300 MG TABS tablet TAKE 1 TABLET EVERY MORNING 06/07/17   [name], MD  JANUMET XR 50-1000 MG TB24 TAKE 1 TABLET TWICE A DAY 10/08/16   [name], MD  L-Methylfolate-B6-B12 Lebron Quam)  3-35-2 MG TABS TAKE 1 TABLET TWICE A DAY 03/27/14   [name], MD  metoprolol tartrate (LOPRESSOR) 25 MG tablet Take 12.5 mg by mouth 2 (two) times daily.     [provider]  omeprazole (PRILOSEC) 40 MG capsule Take 1 capsule (40 mg total) by mouth daily. 05/22/16   Ria Bush, MD  prazosin (MINIPRESS) 1 MG capsule Take 1 mg by mouth at bedtime.    [provider]  ramipril (ALTACE) 5 MG capsule Take 5 mg by mouth daily.    [provider]  rosuvastatin (CRESTOR) 40 MG tablet Take 1 tablet (40 mg total) by mouth daily. 02/08/16   Ria Bush, MD   sertraline (ZOLOFT) 100 MG tablet Take 100 mg by mouth daily.    [provider]  sitaGLIPtin-metformin (JANUMET) 50-1000 MG tablet Take 2 tablets by mouth every morning. 07/15/16   [name], MD    No Known Allergies  Family History  Problem Relation Age of Onset  . Cancer Father 85       died of prostate or bladder CA, pt unsure  . Coronary artery disease Brother 59       bypass  . Hyperlipidemia Brother   . Hypertension Brother   . Coronary artery disease Maternal Uncle 59       MI  . Diabetes Neg Hx   . Stroke Neg Hx     Social History Social History   Tobacco Use  . Smoking status: Former Smoker    Last attempt to quit: 02/16/1966    Years since quitting: 51.8  . Smokeless tobacco: Never Used  Substance Use Topics  . Alcohol use: No  . Drug use: No    Review of Systems Constitutional: Negative for loss of consciousness Cardiovascular: Negative for chest pain. Respiratory: Negative for shortness of breath. Gastrointestinal: Negative for abdominal pain, vomiting Genitourinary: Negative for urinary compaints Musculoskeletal: Negative for musculoskeletal complaints Skin: Negative for skin complaints  Neurological: Negative for headache All other ROS negative  ____________________________________________   PHYSICAL EXAM:  VITAL SIGNS: ED Triage Vitals  Enc Vitals Group     BP 12/27/17 1511 129/78     Pulse Rate 12/27/17 1511 95     Resp 12/27/17 1511 18     Temp 12/27/17 1511 98.1 F (36.7 C)     Temp Source 12/27/17 1511 Oral     SpO2 12/27/17 1511 97 %     Weight 12/27/17 1513 168 lb (76.2 kg)     Height 12/27/17 1513 5\' 8"  (1.727 m)     Head Circumference --      Peak Flow --      Pain Score 12/27/17 1513 5     Pain Loc --      Pain Edu? --      Excl. in [place]? --    Constitutional: Alert and oriented. Well appearing and in no distress. Eyes: Mild to moderate periorbital edema of the left eye with mild ecchymosis/hematoma, Perl,  EOMI. ENT   Head: 2 x 2 centimeter abrasion to left forehead, hemostatic   Mouth/Throat: Mucous membranes are moist. Cardiovascular: Normal rate, regular rhythm. No murmur Respiratory: Normal respiratory effort without tachypnea nor retractions. Breath sounds are clear  Gastrointestinal: Soft and nontender. No distention.   Musculoskeletal: Nontender with normal range of motion in all extremities.  Neurologic:  Normal speech and language. No gross focal neurologic deficits Skin: 2 x 2 centimeter abrasion to the left forehead, hemostatic Psychiatric: Mood and affect  are normal.   ____________________________________________   RADIOLOGY  CT scans are negative for acute abnormality  ____________________________________________   INITIAL IMPRESSION / ASSESSMENT AND PLAN / ED COURSE  Pertinent labs & imaging results that were available during my care of the patient were reviewed by me and considered in my medical decision making (see chart for details).  Patient presents emergency department after mechanical fall, no LOC.  Has a small abrasion approximately 2 a 2 cm to the left forehead, hemostatic, does have periorbital edema around the left eye with underlying ecchymosis/hematoma.  Denies any visual changes, extraocular muscles intact, Perl.  Mild tenderness over this area, CT scans are negative for acute fracture.  Patient does not know when his last tetanus shot was we will update his tetanus, treat with bacitracin.  Patient will be discharged with PCP follow-up.  Patient agreeable to plan of care.  ____________________________________________   FINAL CLINICAL IMPRESSION(S) / ED DIAGNOSES  Fall Head injury    Harvest Dark, MD 12/27/17 1843

## 2017-12-31 ENCOUNTER — Encounter: Payer: Medicare Other | Admitting: Family Medicine

## 2019-01-19 DIAGNOSIS — L578 Other skin changes due to chronic exposure to nonionizing radiation: Secondary | ICD-10-CM | POA: Diagnosis not present

## 2019-01-19 DIAGNOSIS — D229 Melanocytic nevi, unspecified: Secondary | ICD-10-CM | POA: Diagnosis not present

## 2019-01-19 DIAGNOSIS — L821 Other seborrheic keratosis: Secondary | ICD-10-CM | POA: Diagnosis not present

## 2019-01-19 DIAGNOSIS — L814 Other melanin hyperpigmentation: Secondary | ICD-10-CM | POA: Diagnosis not present

## 2019-01-19 DIAGNOSIS — D1801 Hemangioma of skin and subcutaneous tissue: Secondary | ICD-10-CM | POA: Diagnosis not present

## 2019-01-19 DIAGNOSIS — L918 Other hypertrophic disorders of the skin: Secondary | ICD-10-CM | POA: Diagnosis not present

## 2019-01-19 DIAGNOSIS — L570 Actinic keratosis: Secondary | ICD-10-CM | POA: Diagnosis not present

## 2019-01-19 DIAGNOSIS — L820 Inflamed seborrheic keratosis: Secondary | ICD-10-CM | POA: Diagnosis not present

## 2019-01-19 DIAGNOSIS — Z1283 Encounter for screening for malignant neoplasm of skin: Secondary | ICD-10-CM | POA: Diagnosis not present

## 2019-01-27 DIAGNOSIS — E119 Type 2 diabetes mellitus without complications: Secondary | ICD-10-CM | POA: Diagnosis not present

## 2019-01-27 DIAGNOSIS — D3131 Benign neoplasm of right choroid: Secondary | ICD-10-CM | POA: Diagnosis not present

## 2019-01-27 DIAGNOSIS — H401131 Primary open-angle glaucoma, bilateral, mild stage: Secondary | ICD-10-CM | POA: Diagnosis not present

## 2019-01-27 DIAGNOSIS — H02889 Meibomian gland dysfunction of unspecified eye, unspecified eyelid: Secondary | ICD-10-CM | POA: Diagnosis not present

## 2019-01-27 DIAGNOSIS — H26491 Other secondary cataract, right eye: Secondary | ICD-10-CM | POA: Diagnosis not present

## 2019-01-27 DIAGNOSIS — H40019 Open angle with borderline findings, low risk, unspecified eye: Secondary | ICD-10-CM | POA: Diagnosis not present

## 2019-07-27 ENCOUNTER — Ambulatory Visit (INDEPENDENT_AMBULATORY_CARE_PROVIDER_SITE_OTHER): Payer: Medicare Other | Admitting: Dermatology

## 2019-07-27 ENCOUNTER — Other Ambulatory Visit: Payer: Self-pay

## 2019-07-27 DIAGNOSIS — L578 Other skin changes due to chronic exposure to nonionizing radiation: Secondary | ICD-10-CM | POA: Diagnosis not present

## 2019-07-27 DIAGNOSIS — L570 Actinic keratosis: Secondary | ICD-10-CM

## 2019-07-27 DIAGNOSIS — L821 Other seborrheic keratosis: Secondary | ICD-10-CM | POA: Diagnosis not present

## 2019-07-27 NOTE — Patient Instructions (Signed)
Cryotherapy Aftercare  . Wash gently with soap and water everyday.   . Apply Vaseline and Band-Aid daily until healed.  

## 2019-07-27 NOTE — Progress Notes (Signed)
   Follow-Up Visit   Subjective  William Walton is a 79 y.o. male who presents for the following: Actinic Keratosis (face, scalp, 55m f/u, and check sun exposed areas).   The following portions of the chart were reviewed this encounter and updated as appropriate:  Tobacco  Allergies  Meds  Problems  Med Hx  Surg Hx  Fam Hx      Review of Systems:  No other skin or systemic complaints except as noted in HPI or Assessment and Plan.  Objective  Well appearing patient in no apparent distress; mood and affect are within normal limits.  A focused examination was performed including face, scalp, arms. Relevant physical exam findings are noted in the Assessment and Plan.  Objective  scalp, face, L ear x 16 (16): Pink scaly macules    Assessment & Plan    Actinic Damage - diffuse scaly erythematous macules with underlying dyspigmentation - Recommend daily broad spectrum sunscreen SPF 30+ to sun-exposed areas, reapply every 2 hours as needed.  - Call for new or changing lesions.  Seborrheic Keratoses - Stuck-on, waxy, tan-brown papules and plaques  - Discussed benign etiology and prognosis. - Observe - Call for any changes  AK (actinic keratosis) (16) scalp, face, L ear x 16  Destruction of lesion - scalp, face, L ear x 16 Complexity: simple   Destruction method: cryotherapy   Informed consent: discussed and consent obtained   Timeout:  patient name, date of birth, surgical site, and procedure verified Lesion destroyed using liquid nitrogen: Yes   Region frozen until ice ball extended beyond lesion: Yes   Outcome: patient tolerated procedure well with no complications   Post-procedure details: wound care instructions given    Return in about 6 months (around 01/26/2020) for TBSE.  I, Othelia Pulling, RMA, am acting as scribe for Sarina Ser, MD .  Documentation: I have reviewed the above documentation for accuracy and completeness, and I agree with the above.  Sarina Ser, MD

## 2019-07-31 ENCOUNTER — Encounter: Payer: Self-pay | Admitting: Dermatology

## 2019-10-09 ENCOUNTER — Ambulatory Visit
Admission: EM | Admit: 2019-10-09 | Discharge: 2019-10-09 | Disposition: A | Discharge status: Home / Self Care | Payer: Medicare Other | Attending: Emergency Medicine | Admitting: Emergency Medicine

## 2019-10-09 DIAGNOSIS — J069 Acute upper respiratory infection, unspecified: Secondary | ICD-10-CM

## 2019-10-09 DIAGNOSIS — R059 Cough, unspecified: Secondary | ICD-10-CM

## 2019-10-09 DIAGNOSIS — R05 Cough: Secondary | ICD-10-CM | POA: Diagnosis not present

## 2019-10-09 NOTE — ED Triage Notes (Signed)
Pt presents with cough, nasal congestion and body aches x 3 days. Pt denies SOB, chest pain, fever. Pt reports 2 people in his household tested positive for RVS in the past 2-3 weeks.

## 2019-10-09 NOTE — Discharge Instructions (Signed)
Your COVID test is pending.  You should self quarantine until the test result is back.    Take Tylenol as needed for fever or discomfort.  Take Mucinex as needed for congestion.  Rest and keep yourself hydrated.    Go to the emergency department if you develop acute worsening symptoms.

## 2019-10-09 NOTE — ED Provider Notes (Signed)
William Walton    CSN: [id number] Arrival date & time: 10/09/19  1247      History   Chief Complaint Chief Complaint  Patient presents with  . Nasal Congestion    HPI William Walton is a 79 y.o. male.   Patient presents with 3-day history of body aches, nasal congestion, postnasal drip, nonproductive cough.  2 people in his household tested positive for RSV.  He denies fever, chills, shortness of breath, vomiting, diarrhea, rash, or other symptoms.  Treatment attempted at home with Bayer aspirin.  The history is provided by the patient.    Past Medical History:  Diagnosis Date  . Actinic keratosis    face  . Arthritis   . CAD (coronary artery disease) 08/2010   STEMI 08/2010 s/p 5v CABG at [facility], cardiac rehab - only did 7/26 sessions  . Controlled type 2 diabetes mellitus with circulatory disorder ([place]) 2012   [place]  . ED (erectile dysfunction)    likely medication induced  . HLD (hyperlipidemia)   . HTN (hypertension)   . Hypertension   . Mild carotid artery disease ([place]) 10/19/2015   Mild plaque build-up by [place] 10/2015, monitor clinically   . Spondyloarthropathy 05/2014   by Xray    Patient Active Problem List   Diagnosis Date Noted  . Peripheral neuropathy 04/30/2017  . Cough 05/22/2016  . Advanced care planning/counseling discussion 02/06/2016  . Mild carotid artery disease ([place]) 10/19/2015  . TIA (transient ischemic attack) 10/02/2015  . Weight loss 10/02/2015  . Injury of right leg 06/14/2015  . Primary osteoarthritis of both knees 05/18/2014  . Spondyloarthropathy 05/18/2014  . Low back pain 05/10/2014  . Controlled type 2 diabetes mellitus with circulatory disorder ([place]) 12/01/2013  . Medicare annual wellness visit, initial 10/21/2010  . ED (erectile dysfunction) 10/21/2010  . HTN (hypertension)   . HLD (hyperlipidemia)   . CAD (coronary artery disease)     Past Surgical History:  Procedure Laterality Date  . CATARACT EXTRACTION  11/2010  .  CORONARY ARTERY BYPASS GRAFT  08/2010   5v, [facility] Dr. Julaine Hua  . ESOPHAGOGASTRODUODENOSCOPY  08/2010   normal esophagus, localized gastric erythema  . TOTAL KNEE ARTHROPLASTY Left 08/2012   [facility] at [place]  . US ECHOCARDIOGRAPHY  08/2010   mild LV dysfxn (EF 45%) with severe LVH, nl R vent sys fxn       Home Medications    Prior to Admission medications   Medication Sig Start Date End Date Taking? Authorizing Provider  amoxicillin (AMOXIL) 500 MG capsule Take 2,000 mg by mouth daily. Take one hour prior to dental treatment    [provider]  aspirin 81 MG tablet Take 81 mg by mouth every morning.     [provider]  FREESTYLE LITE test strip USE TO TEST BLOOD SUGAR TWICE A DAY AS INSTRUCTED 06/07/17   [name], MD  gabapentin (NEURONTIN) 300 MG capsule Take 900 mg by mouth 3 (three) times daily.    [provider]  glipiZIDE (GLIPIZIDE XL) 5 MG 24 hr tablet TAKE 1 TABLET IN THE MORNING AND 2 TABLETS IN THE EVENING 07/15/16   [name], MD  [facility] 300 MG TABS tablet TAKE 1 TABLET EVERY MORNING 06/07/17   [name], MD  JANUMET XR 50-1000 MG TB24 TAKE 1 TABLET TWICE A DAY 10/08/16   [name], MD  L-Methylfolate-B6-B12 Lebron Quam) 3-35-2 MG TABS TAKE 1 TABLET TWICE A DAY 03/27/14   [name], MD  metoprolol  tartrate (LOPRESSOR) 25 MG tablet Take 12.5 mg by mouth 2 (two) times daily.     [provider]  omeprazole (PRILOSEC) 40 MG capsule Take 1 capsule (40 mg total) by mouth daily. 05/22/16   Ria Bush, MD  prazosin (MINIPRESS) 1 MG capsule Take 1 mg by mouth at bedtime.    [provider]  ramipril (ALTACE) 5 MG capsule Take 5 mg by mouth daily.    [provider]  rosuvastatin (CRESTOR) 40 MG tablet Take 1 tablet (40 mg total) by mouth daily. 02/08/16   Ria Bush, MD  sertraline (ZOLOFT) 100 MG tablet Take 100 mg by mouth daily.    [provider]  sitaGLIPtin-metformin (JANUMET)  50-1000 MG tablet Take 2 tablets by mouth every morning. 07/15/16   [name], MD    Family History Family History  Problem Relation Age of Onset  . Cancer Father 79       died of prostate or bladder CA, pt unsure  . Coronary artery disease Brother 59       bypass  . Hyperlipidemia Brother   . Hypertension Brother   . Coronary artery disease Maternal Uncle 59       MI  . Diabetes Neg Hx   . Stroke Neg Hx     Social History Social History   Tobacco Use  . Smoking status: Former Smoker    Quit date: 02/16/1966    Years since quitting: 53.6  . Smokeless tobacco: Never Used  Substance Use Topics  . Alcohol use: No  . Drug use: No     Allergies   Patient has no known allergies.   Review of Systems Review of Systems  Constitutional: Negative for chills and fever.  HENT: Positive for congestion, postnasal drip, rhinorrhea and sinus pressure. Negative for ear pain and sore throat.   Eyes: Negative for pain and visual disturbance.  Respiratory: Positive for cough. Negative for shortness of breath.   Cardiovascular: Negative for chest pain and palpitations.  Gastrointestinal: Negative for abdominal pain and vomiting.  Genitourinary: Negative for dysuria and hematuria.  Musculoskeletal: Negative for arthralgias and back pain.  Skin: Negative for color change and rash.  Neurological: Negative for seizures and syncope.  All other systems reviewed and are negative.    Physical Exam Triage Vital Signs ED Triage Vitals  Enc Vitals Group     BP 10/09/19 1255 128/77     Pulse Rate 10/09/19 1255 92     Resp 10/09/19 1255 20     Temp 10/09/19 1255 98.7 F (37.1 C)     Temp Source 10/09/19 1255 Oral     SpO2 10/09/19 1255 95 %     Weight --      Height --      Head Circumference --      Peak Flow --      Pain Score 10/09/19 1250 0     Pain Loc --      Pain Edu? --      Excl. in [place]? --    No data found.  Updated Vital Signs BP 128/77 (BP Location: Left Arm)    Pulse 92   Temp 98.7 F (37.1 C) (Oral)   Resp 20   SpO2 95%   Visual Acuity Right Eye Distance:   Left Eye Distance:   Bilateral Distance:    Right Eye Near:   Left Eye Near:    Bilateral Near:     Physical Exam Vitals and nursing  note reviewed.  Constitutional:      General: He is not in acute distress.    Appearance: He is well-developed. He is not ill-appearing.  HENT:     Head: Normocephalic and atraumatic.     Right Ear: Tympanic membrane normal.     Left Ear: Tympanic membrane normal.     Nose: Nose normal.     Mouth/Throat:     Mouth: Mucous membranes are moist.     Pharynx: Oropharynx is clear.  Eyes:     Conjunctiva/sclera: Conjunctivae normal.  Cardiovascular:     Rate and Rhythm: Normal rate and regular rhythm.     Heart sounds: No murmur heard.   Pulmonary:     Effort: Pulmonary effort is normal. No respiratory distress.     Breath sounds: Normal breath sounds.  Abdominal:     Palpations: Abdomen is soft.     Tenderness: There is no abdominal tenderness. There is no guarding or rebound.  Musculoskeletal:     Cervical back: Neck supple.  Skin:    General: Skin is warm and dry.     Findings: No rash.  Neurological:     General: No focal deficit present.     Mental Status: He is alert and oriented to person, place, and time.     Gait: Gait normal.  Psychiatric:        Mood and Affect: Mood normal.        Behavior: Behavior normal.      UC Treatments / Results  Labs (all labs ordered are listed, but only abnormal results are displayed) Labs Reviewed  NOVEL CORONAVIRUS, NAA    EKG   Radiology No results found.  Procedures Procedures (including critical care time)  Medications Ordered in UC Medications - No data to display  Initial Impression / Assessment and Plan / UC Course  I have reviewed the triage vital signs and the nursing notes.  Pertinent labs & imaging results that were available during my care of the patient were  reviewed by me and considered in my medical decision making (see chart for details).   Viral upper respiratory infection with cough.  PCR COVID test performed here.  Instructed patient to self quarantine until the test result is back.  Discussed with patient that he can take Tylenol as needed for fever or discomfort and Mucinex as needed for congestion.  Instructed patient to go to the emergency department if he develops high fever, shortness of breath, severe diarrhea, or other concerning symptoms.  Patient agrees with plan of care.     Final Clinical Impressions(s) / UC Diagnoses   Final diagnoses:  Viral URI with cough     Discharge Instructions     Your COVID test is pending.  You should self quarantine until the test result is back.    Take Tylenol as needed for fever or discomfort.  Take Mucinex as needed for congestion.  Rest and keep yourself hydrated.    Go to the emergency department if you develop acute worsening symptoms.        ED Prescriptions    None     PDMP not reviewed this encounter.   Sharion Balloon, NP 10/09/19 1310

## 2019-10-11 LAB — SARS-COV-2, NAA 2 DAY TAT

## 2019-10-11 LAB — NOVEL CORONAVIRUS, NAA: SARS-CoV-2, NAA: NOT DETECTED

## 2020-03-08 DIAGNOSIS — H40013 Open angle with borderline findings, low risk, bilateral: Secondary | ICD-10-CM | POA: Diagnosis not present

## 2020-03-08 DIAGNOSIS — D3131 Benign neoplasm of right choroid: Secondary | ICD-10-CM | POA: Diagnosis not present

## 2020-03-08 DIAGNOSIS — H26491 Other secondary cataract, right eye: Secondary | ICD-10-CM | POA: Diagnosis not present

## 2020-03-08 DIAGNOSIS — H02889 Meibomian gland dysfunction of unspecified eye, unspecified eyelid: Secondary | ICD-10-CM | POA: Diagnosis not present

## 2020-03-08 DIAGNOSIS — E119 Type 2 diabetes mellitus without complications: Secondary | ICD-10-CM | POA: Diagnosis not present

## 2020-03-11 ENCOUNTER — Encounter: Payer: Medicare Other | Admitting: Dermatology

## 2020-04-18 DIAGNOSIS — E119 Type 2 diabetes mellitus without complications: Secondary | ICD-10-CM | POA: Diagnosis not present

## 2020-04-18 DIAGNOSIS — H1045 Other chronic allergic conjunctivitis: Secondary | ICD-10-CM | POA: Diagnosis not present

## 2020-04-18 DIAGNOSIS — D3131 Benign neoplasm of right choroid: Secondary | ICD-10-CM | POA: Diagnosis not present

## 2020-04-18 DIAGNOSIS — H40019 Open angle with borderline findings, low risk, unspecified eye: Secondary | ICD-10-CM | POA: Diagnosis not present

## 2020-04-18 DIAGNOSIS — H02889 Meibomian gland dysfunction of unspecified eye, unspecified eyelid: Secondary | ICD-10-CM | POA: Diagnosis not present

## 2020-04-18 DIAGNOSIS — H26491 Other secondary cataract, right eye: Secondary | ICD-10-CM | POA: Diagnosis not present

## 2020-04-18 DIAGNOSIS — H01009 Unspecified blepharitis unspecified eye, unspecified eyelid: Secondary | ICD-10-CM | POA: Diagnosis not present

## 2020-04-18 LAB — HM DIABETES EYE EXAM

## 2020-07-03 ENCOUNTER — Encounter: Payer: Medicare Other | Admitting: Dermatology

## 2020-10-01 ENCOUNTER — Ambulatory Visit: Payer: Medicare Other | Admitting: Dermatology

## 2021-01-02 ENCOUNTER — Ambulatory Visit (INDEPENDENT_AMBULATORY_CARE_PROVIDER_SITE_OTHER): Payer: Medicare Other | Admitting: Dermatology

## 2021-01-02 ENCOUNTER — Other Ambulatory Visit: Payer: Self-pay

## 2021-01-02 DIAGNOSIS — L570 Actinic keratosis: Secondary | ICD-10-CM | POA: Diagnosis not present

## 2021-01-02 DIAGNOSIS — L578 Other skin changes due to chronic exposure to nonionizing radiation: Secondary | ICD-10-CM

## 2021-01-02 DIAGNOSIS — L814 Other melanin hyperpigmentation: Secondary | ICD-10-CM | POA: Diagnosis not present

## 2021-01-02 DIAGNOSIS — B353 Tinea pedis: Secondary | ICD-10-CM

## 2021-01-02 DIAGNOSIS — B351 Tinea unguium: Secondary | ICD-10-CM

## 2021-01-02 DIAGNOSIS — D229 Melanocytic nevi, unspecified: Secondary | ICD-10-CM | POA: Diagnosis not present

## 2021-01-02 DIAGNOSIS — D1801 Hemangioma of skin and subcutaneous tissue: Secondary | ICD-10-CM | POA: Diagnosis not present

## 2021-01-02 DIAGNOSIS — L821 Other seborrheic keratosis: Secondary | ICD-10-CM | POA: Diagnosis not present

## 2021-01-02 DIAGNOSIS — Z1283 Encounter for screening for malignant neoplasm of skin: Secondary | ICD-10-CM | POA: Diagnosis not present

## 2021-01-02 DIAGNOSIS — Z872 Personal history of diseases of the skin and subcutaneous tissue: Secondary | ICD-10-CM

## 2021-01-02 DIAGNOSIS — L609 Nail disorder, unspecified: Secondary | ICD-10-CM

## 2021-01-02 MED ORDER — KETOCONAZOLE 2 % EX CREA: 1.0000 "application " | TOPICAL_CREAM | Freq: Two times a day (BID) | CUTANEOUS | 5 refills | Status: AC

## 2021-01-02 NOTE — Patient Instructions (Addendum)
Prior to procedure, discussed risks of blister formation, small wound, skin dyspigmentation, or rare scar following cryotherapy. Recommend Vaseline ointment to treated areas while healing.     If You Need Anything After Your Visit  If you have any questions or concerns for your doctor, please call our main line at [phone] and press option 4 to reach your doctor's medical assistant. If no one answers, please leave a voicemail as directed and we will return your call as soon as possible. Messages left after 4 pm will be answered the following business day.   You may also send us a message via MyChart. We typically respond to MyChart messages within 1-2 business days.  For prescription refills, please ask your pharmacy to contact our office. Our fax number is [phone].  If you have an urgent issue when the clinic is closed that cannot wait until the next business day, you can page your doctor at the number below.    Please note that while we do our best to be available for urgent issues outside of office hours, we are not available 24/7.   If you have an urgent issue and are unable to reach us, you may choose to seek medical care at your doctor's office, retail clinic, urgent care center, or emergency room.  If you have a medical emergency, please immediately call 911 or go to the emergency department.  Pager Numbers  - Dr. Kowalski: [phone]  - Dr. Moye: [phone]  - Dr. Stewart: [phone]  In the event of inclement weather, please call our main line at [phone] for an update on the status of any delays or closures.  Dermatology Medication Tips: Please keep the boxes that topical medications come in in order to help keep track of the instructions about where and how to use these. Pharmacies typically print the medication instructions only on the boxes and not directly on the medication tubes.   If your medication is too expensive, please contact our office at  [phone] option 4 or send us a message through MyChart.   We are unable to tell what your co-pay for medications will be in advance as this is different depending on your insurance coverage. However, we may be able to find a substitute medication at lower cost or fill out paperwork to get insurance to cover a needed medication.   If a prior authorization is required to get your medication covered by your insurance company, please allow us 1-2 business days to complete this process.  Drug prices often vary depending on where the prescription is filled and some pharmacies may offer cheaper prices.  The website www.goodrx.com contains coupons for medications through different pharmacies. The prices here do not account for what the cost may be with help from insurance (it may be cheaper with your insurance), but the website can give you the price if you did not use any insurance.  - You can print the associated coupon and take it with your prescription to the pharmacy.  - You may also stop by our office during regular business hours and pick up a GoodRx coupon card.  - If you need your prescription sent electronically to a different pharmacy, notify our office through [facility] MyChart or by phone at [phone] option 4.     Si Usted Necesita Algo Despus de Su Visita  Tambin puede enviarnos un mensaje a travs de MyChart. Por lo general respondemos a los mensajes de MyChart en el transcurso de 1 a 2 das   hbiles.  Para renovar recetas, por favor pida a su farmacia que se ponga en contacto con nuestra oficina. Nuestro nmero de fax es el [phone].  Si tiene un asunto urgente cuando la clnica est cerrada y que no puede esperar hasta el siguiente da hbil, puede llamar/localizar a su doctor(a) al nmero que aparece a continuacin.   Por favor, tenga en cuenta que aunque hacemos todo lo posible para estar disponibles para asuntos urgentes fuera del horario de oficina, no estamos  disponibles las 24 horas del da, los 7 das de la semana.   Si tiene un problema urgente y no puede comunicarse con nosotros, puede optar por buscar atencin mdica  en el consultorio de su doctor(a), en una clnica privada, en un centro de atencin urgente o en una sala de emergencias.  Si tiene una emergencia mdica, por favor llame inmediatamente al 911 o vaya a la sala de emergencias.  Nmeros de bper  - Dr. Kowalski: [phone]  - Dra. Moye: [phone]  - Dra. Stewart: [phone]  En caso de inclemencias del tiempo, por favor llame a nuestra lnea principal al [phone] para una actualizacin sobre el estado de cualquier retraso o cierre.  Consejos para la medicacin en dermatologa: Por favor, guarde las cajas en las que vienen los medicamentos de uso tpico para ayudarle a seguir las instrucciones sobre dnde y cmo usarlos. Las farmacias generalmente imprimen las instrucciones del medicamento slo en las cajas y no directamente en los tubos del medicamento.   Si su medicamento es muy caro, por favor, pngase en contacto con nuestra oficina llamando al [phone] y presione la opcin 4 o envenos un mensaje a travs de MyChart.   No podemos decirle cul ser su copago por los medicamentos por adelantado ya que esto es diferente dependiendo de la cobertura de su seguro. Sin embargo, es posible que podamos encontrar un medicamento sustituto a menor costo o llenar un formulario para que el seguro cubra el medicamento que se considera necesario.   Si se requiere una autorizacin previa para que su compaa de seguros cubra su medicamento, por favor permtanos de 1 a 2 das hbiles para completar este proceso.  Los precios de los medicamentos varan con frecuencia dependiendo del lugar de dnde se surte la receta y alguna farmacias pueden ofrecer precios ms baratos.  El sitio web www.goodrx.com tiene cupones para medicamentos de diferentes farmacias. Los precios aqu no  tienen en cuenta lo que podra costar con la ayuda del seguro (puede ser ms barato con su seguro), pero el sitio web puede darle el precio si no utiliz ningn seguro.  - Puede imprimir el cupn correspondiente y llevarlo con su receta a la farmacia.  - Tambin puede pasar por nuestra oficina durante el horario de atencin regular y recoger una tarjeta de cupones de GoodRx.  - Si necesita que su receta se enve electrnicamente a una farmacia diferente, informe a nuestra oficina a travs de MyChart de  o por telfono llamando al [phone] y presione la opcin 4.  

## 2021-01-02 NOTE — Progress Notes (Signed)
Follow-Up Visit   Subjective  William Walton is a 80 y.o. male who presents for the following: Actinic Keratosis (6 months f/u ). Hx of Aks  The patient presents for Total-Body Skin Exam (TBSE) for skin cancer screening and mole check.   The following portions of the chart were reviewed this encounter and updated as appropriate:   Tobacco  Allergies  Meds  Problems  Med Hx  Surg Hx  Fam Hx     Review of Systems:  No other skin or systemic complaints except as noted in HPI or Assessment and Plan.  Objective  Well appearing patient in no apparent distress; mood and affect are within normal limits.  A full examination was performed including scalp, head, eyes, ears, nose, lips, neck, chest, axillae, abdomen, back, buttocks, bilateral upper extremities, bilateral lower extremities, hands, feet, fingers, toes, fingernails, and toenails. All findings within normal limits unless otherwise noted below.  toenails Scaliness of the feet and toenail dystrophy  face x 16 (16) Erythematous thin papules/macules with gritty scale.    Assessment & Plan  Nail problem -tinea pedis and unguium toenails and feet Chronic and persistent Patient declines oral treatment. Will start ketoconazole cream nightly. Advised to Creamalin will not clear her nails but will improve them. Related Medications ketoconazole (NIZORAL) 2 % cream Apply 1 application topically 2 (two) times daily. Apply to feet at bedtime  AK (actinic keratosis) (16) face x 16  Actinic keratoses are precancerous spots that appear secondary to cumulative UV radiation exposure/sun exposure over time. They are chronic with expected duration over 1 year. A portion of actinic keratoses will progress to squamous cell carcinoma of the skin. It is not possible to reliably predict which spots will progress to skin cancer and so treatment is recommended to prevent development of skin cancer.  Recommend daily broad spectrum sunscreen SPF  30+ to sun-exposed areas, reapply every 2 hours as needed.  Recommend staying in the shade or wearing long sleeves, sun glasses (UVA+UVB protection) and wide brim hats (4-inch brim around the entire circumference of the hat). Call for new or changing lesions.   Destruction of lesion - face x 16 Complexity: simple   Destruction method: cryotherapy   Informed consent: discussed and consent obtained   Timeout:  patient name, date of birth, surgical site, and procedure verified Lesion destroyed using liquid nitrogen: Yes   Region frozen until ice ball extended beyond lesion: Yes   Outcome: patient tolerated procedure well with no complications   Post-procedure details: wound care instructions given    Skin cancer screening  Lentigines - Scattered tan macules - Due to sun exposure - Benign-appearing, observe - Recommend daily broad spectrum sunscreen SPF 30+ to sun-exposed areas, reapply every 2 hours as needed. - Call for any changes  Seborrheic Keratoses - Stuck-on, waxy, tan-brown papules and/or plaques  - Benign-appearing - Discussed benign etiology and prognosis. - Observe - Call for any changes  Melanocytic Nevi - Tan-brown and/or pink-flesh-colored symmetric macules and papules - Benign appearing on exam today - Observation - Call clinic for new or changing moles - Recommend daily use of broad spectrum spf 30+ sunscreen to sun-exposed areas.   Hemangiomas - Red papules - Discussed benign nature - Observe - Call for any changes  Actinic Damage - Chronic condition, secondary to cumulative UV/sun exposure - diffuse scaly erythematous macules with underlying dyspigmentation - Recommend daily broad spectrum sunscreen SPF 30+ to sun-exposed areas, reapply every 2 hours as needed.  -  Staying in the shade or wearing long sleeves, sun glasses (UVA+UVB protection) and wide brim hats (4-inch brim around the entire circumference of the hat) are also recommended for sun  protection.  - Call for new or changing lesions.  Skin cancer screening performed today.   Return in about 1 year (around 01/02/2022) for TBSE, hx of AKS .  IMarye Round, CMA, am acting as scribe for Sarina Ser, MD .  Documentation: I have reviewed the above documentation for accuracy and completeness, and I agree with the above.  Sarina Ser, MD

## 2021-01-16 DEATH — deceased

## 2021-01-17 ENCOUNTER — Encounter: Payer: Self-pay | Admitting: Dermatology

## 2021-02-19 ENCOUNTER — Emergency Department
Admission: EM | Admit: 2021-02-19 | Discharge: 2021-02-19 | Disposition: A | Discharge status: Home / Self Care | Payer: Medicare Other | Attending: Emergency Medicine | Admitting: Emergency Medicine

## 2021-02-19 ENCOUNTER — Emergency Department: Payer: Medicare Other

## 2021-02-19 ENCOUNTER — Other Ambulatory Visit: Payer: Self-pay

## 2021-02-19 DIAGNOSIS — I10 Essential (primary) hypertension: Secondary | ICD-10-CM | POA: Diagnosis not present

## 2021-02-19 DIAGNOSIS — R0602 Shortness of breath: Secondary | ICD-10-CM | POA: Diagnosis not present

## 2021-02-19 DIAGNOSIS — E119 Type 2 diabetes mellitus without complications: Secondary | ICD-10-CM | POA: Diagnosis not present

## 2021-02-19 DIAGNOSIS — E871 Hypo-osmolality and hyponatremia: Secondary | ICD-10-CM | POA: Diagnosis not present

## 2021-02-19 DIAGNOSIS — R531 Weakness: Secondary | ICD-10-CM | POA: Diagnosis not present

## 2021-02-19 DIAGNOSIS — I2510 Atherosclerotic heart disease of native coronary artery without angina pectoris: Secondary | ICD-10-CM | POA: Insufficient documentation

## 2021-02-19 DIAGNOSIS — R1010 Upper abdominal pain, unspecified: Secondary | ICD-10-CM | POA: Diagnosis not present

## 2021-02-19 DIAGNOSIS — Z28310 Unvaccinated for covid-19: Secondary | ICD-10-CM | POA: Insufficient documentation

## 2021-02-19 DIAGNOSIS — U071 COVID-19: Secondary | ICD-10-CM

## 2021-02-19 DIAGNOSIS — R059 Cough, unspecified: Secondary | ICD-10-CM | POA: Diagnosis not present

## 2021-02-19 DIAGNOSIS — R509 Fever, unspecified: Secondary | ICD-10-CM | POA: Diagnosis not present

## 2021-02-19 DIAGNOSIS — N400 Enlarged prostate without lower urinary tract symptoms: Secondary | ICD-10-CM | POA: Diagnosis not present

## 2021-02-19 DIAGNOSIS — K5730 Diverticulosis of large intestine without perforation or abscess without bleeding: Secondary | ICD-10-CM | POA: Diagnosis not present

## 2021-02-19 LAB — URINALYSIS, ROUTINE W REFLEX MICROSCOPIC
Bacteria, UA: NONE SEEN
Bilirubin Urine: NEGATIVE
Glucose, UA: >=500 mg/dL — AB
Ketones, ur: 20 mg/dL — AB
Leukocytes,Ua: NEGATIVE
Nitrite: NEGATIVE
Protein, ur: 100 mg/dL — AB
Specific Gravity, Urine: 1.023 (ref 1.005–1.030)
pH: 5 (ref 5.0–8.0)

## 2021-02-19 LAB — BASIC METABOLIC PANEL
Anion gap: 10 (ref 5–15)
BUN: 14 mg/dL (ref 8–23)
CO2: 21 mmol/L — ABNORMAL LOW (ref 22–32)
Calcium: 8.1 mg/dL — ABNORMAL LOW (ref 8.9–10.3)
Chloride: 96 mmol/L — ABNORMAL LOW (ref 98–111)
Creatinine, Ser: 1.03 mg/dL (ref 0.61–1.24)
GFR, Estimated: >60 mL/min (ref >60)
Glucose, Bld: 229 mg/dL — ABNORMAL HIGH (ref 70–99)
Potassium: 4.5 mmol/L (ref 3.5–5.1)
Sodium: 127 mmol/L — ABNORMAL LOW (ref 135–145)

## 2021-02-19 LAB — CBC
HCT: 41.7 % (ref 39.0–52.0)
Hemoglobin: 13.9 g/dL (ref 13.0–17.0)
MCH: 28.7 pg (ref 26.0–34.0)
MCHC: 33.3 g/dL (ref 30.0–36.0)
MCV: 86.2 fL (ref 80.0–100.0)
Platelets: 243 10*3/uL (ref 150–400)
RBC: 4.84 MIL/uL (ref 4.22–5.81)
RDW: 14.3 % (ref 11.5–15.5)
WBC: 8.4 10*3/uL (ref 4.0–10.5)
nRBC: 0 % (ref 0.0–0.2)

## 2021-02-19 LAB — TROPONIN I (HIGH SENSITIVITY): Troponin I (High Sensitivity): 7 ng/L (ref <18)

## 2021-02-19 LAB — CBG MONITORING, ED: Glucose-Capillary: 232 mg/dL — ABNORMAL HIGH (ref 70–99)

## 2021-02-19 LAB — BRAIN NATRIURETIC PEPTIDE: B Natriuretic Peptide: 26 pg/mL (ref 0.0–100.0)

## 2021-02-19 LAB — RESP PANEL BY RT-PCR (FLU A&B, COVID) ARPGX2
Influenza A by PCR: NEGATIVE
Influenza B by PCR: NEGATIVE
SARS Coronavirus 2 by RT PCR: POSITIVE — AB

## 2021-02-19 MED ORDER — IOHEXOL 300 MG/ML  SOLN
100.0000 mL | Freq: Once | INTRAMUSCULAR | Status: AC | PRN
  Administered 2021-02-19: 100 mL via INTRAVENOUS
  Filled 2021-02-19: qty 100

## 2021-02-19 MED ORDER — PREDNISONE 20 MG PO TABS
60.0000 mg | ORAL_TABLET | Freq: Once | ORAL | Status: AC
Start: 2021-02-19 — End: 2021-02-19
  Administered 2021-02-19: 60 mg via ORAL
  Filled 2021-02-19: qty 3

## 2021-02-19 MED ORDER — ONDANSETRON 8 MG PO TBDP: 8.0000 mg | ORAL_TABLET | Freq: Three times a day (TID) | ORAL | 0 refills | Status: DC | PRN

## 2021-02-19 MED ORDER — PREDNISONE 20 MG PO TABS: 60.0000 mg | ORAL_TABLET | Freq: Every day | ORAL | 0 refills | Status: DC

## 2021-02-19 MED ORDER — ALBUTEROL SULFATE HFA 108 (90 BASE) MCG/ACT IN AERS: 2.0000 | INHALATION_SPRAY | Freq: Four times a day (QID) | RESPIRATORY_TRACT | 0 refills | Status: DC | PRN

## 2021-02-19 MED ORDER — SODIUM CHLORIDE 0.9 % IV BOLUS
1000.0000 mL | Freq: Once | INTRAVENOUS | Status: AC
  Administered 2021-02-19: 1000 mL via INTRAVENOUS

## 2021-02-19 NOTE — ED Provider Notes (Signed)
[facility] Provider Note    Event Date/Time   First MD Initiated Contact with Patient 02/19/21 1500     (approximate)   History   Weakness   HPI  William Walton is a 81 y.o. male with past medical history of CAD, DM, hypertension, and hyperlipidemia who presents with generalized weakness for 1 week.  It is associated with nausea and inability to eat or drink; the patient states that when he tries to eat, he feels very nauseous, feels like his stomach is turning, and is unable to continue.  When he stops eating this resolves.  He reports some associated mid/upper abdominal discomfort.  There is no associated vomiting or diarrhea.  He has also had low grade fever/chills, shortness of breath, and cough.     Physical Exam   Triage Vital Signs: ED Triage Vitals  Enc Vitals Group     BP 02/19/21 1208 134/68     Pulse Rate 02/19/21 1208 90     Resp 02/19/21 1208 17     Temp 02/19/21 1208 98.5 F (36.9 C)     Temp Source 02/19/21 1208 Oral     SpO2 02/19/21 1208 96 %     Weight 02/19/21 1208 171 lb 1 oz (77.6 kg)     Height 02/19/21 1208 5\' 8"  (1.727 m)     Head Circumference --      Peak Flow --      Pain Score 02/19/21 1125 3     Pain Loc --      Pain Edu? --      Excl. in [place]? --     Most recent vital signs: Vitals:   02/19/21 1208 02/19/21 1553  BP: 134/68 116/68  Pulse: 90 93  Resp: 17 18  Temp: 98.5 F (36.9 C)   SpO2: 96% 93%    General: Awake, no distress.  CV:  Good peripheral perfusion.  Resp:  Normal effort.  Lungs clear to auscultation bilaterally. Abd:  Soft with mild mid abdominal tenderness.  No distention.  Other:  Mucous membranes somewhat dry.   ED Results / Procedures / Treatments   Labs (all labs ordered are listed, but only abnormal results are displayed) Labs Reviewed  RESP PANEL BY RT-PCR (FLU A&B, COVID) ARPGX2 - Abnormal; Notable for the following components:      Result Value   SARS Coronavirus 2 by RT PCR  POSITIVE (*)    All other components within normal limits  BASIC METABOLIC PANEL - Abnormal; Notable for the following components:   Sodium 127 (*)    Chloride 96 (*)    CO2 21 (*)    Glucose, Bld 229 (*)    Calcium 8.1 (*)    All other components within normal limits  URINALYSIS, ROUTINE W REFLEX MICROSCOPIC - Abnormal; Notable for the following components:   Color, Urine YELLOW (*)    APPearance HAZY (*)    Glucose, UA >=500 (*)    Hgb urine dipstick SMALL (*)    Ketones, ur 20 (*)    Protein, ur 100 (*)    All other components within normal limits  CBG MONITORING, ED - Abnormal; Notable for the following components:   Glucose-Capillary 232 (*)    All other components within normal limits  CBC  BRAIN NATRIURETIC PEPTIDE  TROPONIN I (HIGH SENSITIVITY)     EKG  ED ECG REPORT I, Arta Silence, the attending physician, personally viewed and interpreted this ECG.  Date: 02/19/2021  EKG Time: 1159 Rate: 99 Rhythm: normal sinus rhythm QRS Axis: normal Intervals: normal ST/T Wave abnormalities: Nonspecific T wave abnormality Narrative Interpretation: no evidence of acute ischemia    RADIOLOGY  Chest x-ray interpreted by me shows bilateral lower lobe interstitial opacity; I reviewed the radiology report which confirms this finding.  CT abdomen/pelvis: I reviewed the radiology report which shows bilateral lower lobe groundglass opacities but no acute intra-abdominal process.  IMPRESSION:  1. Patchy bibasilar ground-glass airspace disease most compatible  with COVID-19 pneumonia.  2. Colonic diverticulosis without diverticulitis.  3. Indeterminate 1 cm left renal hypodensity, likely a small cyst.  4. No acute intra-abdominal or intrapelvic process.  5.  Aortic Atherosclerosis (ICD10-I70.0).    PROCEDURES:  Critical Care performed: No  Procedures   MEDICATIONS ORDERED IN ED: Medications  predniSONE (DELTASONE) tablet 60 mg (has no administration in time  range)  sodium chloride 0.9 % bolus 1,000 mL (1,000 mLs Intravenous New Bag/Given 02/19/21 1558)  iohexol (OMNIPAQUE) 300 MG/ML solution 100 mL (100 mLs Intravenous Contrast Given 02/19/21 1622)     IMPRESSION / MDM / ASSESSMENT AND PLAN / ED COURSE  I reviewed the triage vital signs and the nursing notes.  81 year old male with PMH as noted above presents with generalized weakness over the last week associated with nausea, decreased p.o. intake, low-grade fever, cough and shortness of breath.  On exam his vital signs are normal except for borderline low O2 saturation.  Lungs are clear to auscultation.  The abdomen is soft with mild mid abdominal tenderness.  Mucous membranes are dry.  Neurologic exam is nonfocal.  Differential diagnosis includes, but is not limited to, influenza, COVID-19, other viral syndrome, bacterial pneumonia, acute bronchitis, dehydration, electrolyte abnormality, other metabolic cause, or less likely primary cardiac etiology.  We will obtain lab work-up, respiratory panel, chest x-ray, CT abdomen/pelvis, give fluids and reassess.  ----------------------------------------- 5:43 PM on 02/19/2021 -----------------------------------------  I reviewed the chest x-ray and CT both which showed lower lobe groundglass opacities but no acute intra-abdominal findings.  The patient is positive for COVID-19 which is consistent with these findings.  The patient is not vaccinated.  Lab work-up is also significant for sodium of 127.  Troponin and BNP are negative.  There is no indication for repeat based on the duration of the symptoms.  Urinalysis shows findings compatible with dehydration but no evidence of UTI.  Overall presentation is consistent with symptoms related to COVID-19.  Because of the hyponatremia and the patient's reported difficulty tolerating p.o., I considered admission.  However, I discussed this with the patient and he expresses a strong preference to go home.   Given his normal vital signs, O2 saturation in the 90s, and otherwise reassuring lab work-up and imaging findings, I think that this is reasonable.  I counseled the patient and his wife extensively on the results of the work-up and plan of care.  The patient is out of the window for treatment with paxlovid. I will prescribe albuterol and prednisone due to the patient's respiratory symptoms, and Zofran for nausea.  I gave the patient and his wife the return precautions and they expressed understanding.   FINAL CLINICAL IMPRESSION(S) / ED DIAGNOSES   Final diagnoses:  COVID-19  Hyponatremia     Rx / DC Orders   ED Discharge Orders          Ordered    predniSONE (DELTASONE) 20 MG tablet  Daily with breakfast        02/19/21 1741  ondansetron (ZOFRAN-ODT) 8 MG disintegrating tablet  Every 8 hours PRN        02/19/21 1741    albuterol (VENTOLIN HFA) 108 (90 Base) MCG/ACT inhaler  Every 6 hours PRN        02/19/21 1741             Note:  This document was prepared using Dragon voice recognition software and may include unintentional dictation errors.    Arta Silence, MD 02/19/21 [phone]

## 2021-02-19 NOTE — Discharge Instructions (Addendum)
Use the Zofran as needed for nausea and take small sips of water, Pedialyte, or Gatorade in order to help keep yourself hydrated.  Take the prednisone daily starting tomorrow and use the albuterol as needed up to every 4 hours.  Return to the ER immediately for new, worsening, or persistent shortness of breath, oxygen level below 90%, worsening weakness or lightheadedness, muscle spasms, numbness, vomiting, high fever, or any other new or worsening symptoms that concern you.

## 2021-02-19 NOTE — ED Triage Notes (Addendum)
Pt comes with c/o weakness for about a week. Pt not able to eat and drink. Low grade fevers at home, coughing especially at night.  Pt states some SOB. Pt states it is hard for him to breath.  Pt states all he wants to do is sleep.

## 2021-02-25 ENCOUNTER — Encounter: Payer: Self-pay | Admitting: Nurse Practitioner

## 2021-02-25 ENCOUNTER — Other Ambulatory Visit: Payer: Self-pay

## 2021-02-25 ENCOUNTER — Ambulatory Visit (INDEPENDENT_AMBULATORY_CARE_PROVIDER_SITE_OTHER)
Admission: RE | Admit: 2021-02-25 | Discharge: 2021-02-25 | Disposition: A | Discharge status: Home / Self Care | Payer: Medicare Other | Source: Ambulatory Visit | Attending: Nurse Practitioner | Admitting: Nurse Practitioner

## 2021-02-25 ENCOUNTER — Telehealth: Payer: Self-pay | Admitting: Family Medicine

## 2021-02-25 ENCOUNTER — Ambulatory Visit (INDEPENDENT_AMBULATORY_CARE_PROVIDER_SITE_OTHER): Payer: Medicare Other | Admitting: Nurse Practitioner

## 2021-02-25 VITALS — BP 106/54 | HR 79 | Temp 97.6°F | Resp 16 | Ht 68.0 in | Wt 174.1 lb

## 2021-02-25 DIAGNOSIS — U071 COVID-19: Secondary | ICD-10-CM | POA: Insufficient documentation

## 2021-02-25 DIAGNOSIS — R194 Change in bowel habit: Secondary | ICD-10-CM | POA: Diagnosis not present

## 2021-02-25 DIAGNOSIS — K5909 Other constipation: Secondary | ICD-10-CM | POA: Diagnosis not present

## 2021-02-25 DIAGNOSIS — E871 Hypo-osmolality and hyponatremia: Secondary | ICD-10-CM | POA: Diagnosis not present

## 2021-02-25 DIAGNOSIS — M160 Bilateral primary osteoarthritis of hip: Secondary | ICD-10-CM | POA: Diagnosis not present

## 2021-02-25 DIAGNOSIS — J1282 Pneumonia due to coronavirus disease 2019: Secondary | ICD-10-CM

## 2021-02-25 DIAGNOSIS — E1159 Type 2 diabetes mellitus with other circulatory complications: Secondary | ICD-10-CM | POA: Diagnosis not present

## 2021-02-25 HISTORY — DX: Pneumonia due to coronavirus disease 2019: J12.82

## 2021-02-25 HISTORY — DX: COVID-19: U07.1

## 2021-02-25 LAB — CBC WITH DIFFERENTIAL/PLATELET
Basophils Absolute: 0 10*3/uL (ref 0.0–0.1)
Basophils Relative: 0.4 % (ref 0.0–3.0)
Eosinophils Absolute: 0.1 10*3/uL (ref 0.0–0.7)
Eosinophils Relative: 1.2 % (ref 0.0–5.0)
HCT: 41 % (ref 39.0–52.0)
Hemoglobin: 13.6 g/dL (ref 13.0–17.0)
Lymphocytes Relative: 21.5 % (ref 12.0–46.0)
Lymphs Abs: 2.2 10*3/uL (ref 0.7–4.0)
MCHC: 33.2 g/dL (ref 30.0–36.0)
MCV: 84.9 fl (ref 78.0–100.0)
Monocytes Absolute: 0.9 10*3/uL (ref 0.1–1.0)
Monocytes Relative: 9.1 % (ref 3.0–12.0)
Neutro Abs: 6.9 10*3/uL (ref 1.4–7.7)
Neutrophils Relative %: 67.8 % (ref 43.0–77.0)
Platelets: 382 10*3/uL (ref 150.0–400.0)
RBC: 4.83 Mil/uL (ref 4.22–5.81)
RDW: 14.5 % (ref 11.5–15.5)
WBC: 10.2 10*3/uL (ref 4.0–10.5)

## 2021-02-25 LAB — COMPREHENSIVE METABOLIC PANEL
ALT: 15 U/L (ref 0–53)
AST: 12 U/L (ref 0–37)
Albumin: 3.6 g/dL (ref 3.5–5.2)
Alkaline Phosphatase: 45 U/L (ref 39–117)
BUN: 26 mg/dL — ABNORMAL HIGH (ref 6–23)
CO2: 27 mEq/L (ref 19–32)
Calcium: 9.2 mg/dL (ref 8.4–10.5)
Chloride: 93 mEq/L — ABNORMAL LOW (ref 96–112)
Creatinine, Ser: 1.21 mg/dL (ref 0.40–1.50)
GFR: 56.99 mL/min — ABNORMAL LOW (ref >60.00)
Glucose, Bld: 396 mg/dL — ABNORMAL HIGH (ref 70–99)
Potassium: 4.1 mEq/L (ref 3.5–5.1)
Sodium: 127 mEq/L — ABNORMAL LOW (ref 135–145)
Total Bilirubin: 0.7 mg/dL (ref 0.2–1.2)
Total Protein: 6.7 g/dL (ref 6.0–8.3)

## 2021-02-25 MED ORDER — FREESTYLE LIBRE 2 SENSOR MISC: 1.0000 | 2 refills | Status: DC

## 2021-02-25 MED ORDER — FREESTYLE LIBRE 2 READER DEVI: 1.0000 | 0 refills | Status: DC

## 2021-02-25 MED ORDER — DOXYCYCLINE HYCLATE 100 MG PO TABS: 100.0000 mg | ORAL_TABLET | Freq: Two times a day (BID) | ORAL | 0 refills | Status: AC

## 2021-02-25 NOTE — Telephone Encounter (Signed)
Ok to re establish with me.

## 2021-02-25 NOTE — Patient Instructions (Signed)
Nice to see you today I will be in touch regarding the xray and lab results I sent in an antibiotic and the glucose meter We need to get you to follow up in approx 6 weeks to make sure the pneumonia is resolved If you do not start improving over the next 4 days you need to reach out to the office and let me know

## 2021-02-25 NOTE — Assessment & Plan Note (Signed)
Patient was seen in the emergency department diagnosed with pneumonia secondary to COVID-19.  He was giving IV hydration along with steroids and discharged with steroids.  Following up today patient states he is doing the same still having the fatigue cough and some shortness of breath.  We will elect to treat patient with doxycycline 100 mg twice daily for 10 days.  We will repeat blood work today pending lab results.  He will follow-up in approximate 6 weeks for repeat chest x-ray and to reestablish with primary care provider.

## 2021-02-25 NOTE — Telephone Encounter (Signed)
Pt want to know if he can reestablish  Care with dr g. As a ( new pt )

## 2021-02-25 NOTE — Assessment & Plan Note (Signed)
Patient is concerned that he has not had a bowel movement 8 days.  States he is still passing flatus.  He has had marked decrease in intake since being ill this is likely the culprit.  Will obtain 2 view abdomen to make sure he is not full of stool low likelihood of obstruction pending x-ray result

## 2021-02-25 NOTE — Progress Notes (Signed)
Acute Office Visit  Subjective:    Patient ID: William Walton, male    DOB: 1940/02/28, 81 y.o.   MRN: [id number]  Chief Complaint  Patient presents with   ER follow up    Had Covid. Feels the same-not feeling well. All started around Christmas. Went to ER on 02/19/21. He was dehydrated and had IV fluids.    HPI Patient is in today for ED follow up   Was seen in emergency department on 02/19/2021. Had labs drawn that show  States that he is still feeling washed out and weak. Coughing that is dry. SHOB still present. States that he was given steroids that likely helped per report. Finished the steroid pack.   States that he has nausea medicine and states that he is eating three meals a day, now. Some days eats 2 meals and snacks, some days. Is using Gatorade lite and Pedialyte. States peeing about 3 times a day. States urine has lightened in color.   Past Medical History:  Diagnosis Date   Actinic keratosis    face   Arthritis    CAD (coronary artery disease) 08/2010   STEMI 08/2010 s/p 5v CABG at [facility], cardiac rehab - only did 7/26 sessions   Controlled type 2 diabetes mellitus with circulatory disorder ([place]) 2012   [facility]   ED (erectile dysfunction)    likely medication induced   HLD (hyperlipidemia)    HTN (hypertension)    Hypertension    Mild carotid artery disease ([place]) 10/19/2015   Mild plaque build-up by [place] 10/2015, monitor clinically    Spondyloarthropathy 05/2014   by Xray    Past Surgical History:  Procedure Laterality Date   CATARACT EXTRACTION  11/2010   CORONARY ARTERY BYPASS GRAFT  08/2010   5v, [facility] Dr. Julaine Hua   ESOPHAGOGASTRODUODENOSCOPY  08/2010   normal esophagus, localized gastric erythema   TOTAL KNEE ARTHROPLASTY Left 08/2012   [facility] at [facility]   US ECHOCARDIOGRAPHY  08/2010   mild LV dysfxn (EF 45%) with severe LVH, nl R vent sys fxn    Family History  Problem Relation Age of Onset   Cancer Father 56       died of prostate or bladder CA, pt unsure    Coronary artery disease Brother 71       bypass   Hyperlipidemia Brother    Hypertension Brother    Coronary artery disease Maternal Uncle 59       MI   Diabetes Neg Hx    Stroke Neg Hx     Social History   Socioeconomic History   Marital status: Married    Spouse name: Not on file   Number of children: 2   Years of education: 4 yr colle   Highest education level: Not on file  Occupational History   Occupation: retired Lobbyist: RETIRED  Tobacco Use   Smoking status: Former    Types: Cigarettes    Quit date: 02/16/1966    Years since quitting: 55.0   Smokeless tobacco: Never  Substance and Sexual Activity   Alcohol use: No   Drug use: No   Sexual activity: Never  Other Topics Concern   Not on file  Social History Narrative   Caffeine: 3-4 cups caffeine free coffee, unsweet tea, diet caffeine free soda   Lives with wife, daughter, son in Sports coach and granddaughter, 1 dog Bichon   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)   Activity: membership at [employer], previously  went 3d/wk (lifting)   Diet: fruits and vegetables, meat and potatoes, water   Social Determinants of Health   Financial Resource Strain: Not on file  Food Insecurity: Not on file  Transportation Needs: Not on file  Physical Activity: Not on file  Stress: Not on file  Social Connections: Not on file  Intimate Partner Violence: Not on file    Outpatient Medications Prior to Visit  Medication Sig Dispense Refill   albuterol (VENTOLIN HFA) 108 (90 Base) MCG/ACT inhaler Inhale 2 puffs into the lungs every 6 (six) hours as needed for wheezing or shortness of breath. 8 g 0   amoxicillin (AMOXIL) 500 MG capsule Take 2,000 mg by mouth daily. Take one hour prior to dental treatment     aspirin 81 MG tablet Take 81 mg by mouth every morning.      Casanthranol-Docusate Sodium (STOOL SOFTENER PLUS PO) Take by mouth.     FREESTYLE LITE test strip USE TO TEST BLOOD SUGAR TWICE A DAY AS INSTRUCTED 200 each 2    insulin aspart protamine - aspart (NOVOLOG MIX 70/30 FLEXPEN) (70-30) 100 UNIT/ML FlexPen 36 u in the morning and 33 units before supper     ondansetron (ZOFRAN-ODT) 8 MG disintegrating tablet Take 1 tablet (8 mg total) by mouth every 8 (eight) hours as needed for nausea or vomiting. 15 tablet 0   gabapentin (NEURONTIN) 300 MG capsule Take 900 mg by mouth 3 (three) times daily. (Patient not taking: Reported on 02/25/2021)     glipiZIDE (GLIPIZIDE XL) 5 MG 24 hr tablet TAKE 1 TABLET IN THE MORNING AND 2 TABLETS IN THE EVENING (Patient not taking: Reported on 02/25/2021) 270 tablet 3   INVOKANA 300 MG TABS tablet TAKE 1 TABLET EVERY MORNING (Patient not taking: Reported on 02/25/2021) 90 tablet 3   JANUMET XR 50-1000 MG TB24 TAKE 1 TABLET TWICE A DAY (Patient not taking: Reported on 02/25/2021) 180 tablet 0   L-Methylfolate-B6-B12 (FOLTANX) 3-35-2 MG TABS TAKE 1 TABLET TWICE A DAY (Patient not taking: Reported on 02/25/2021) 2 tablet 2   metoprolol tartrate (LOPRESSOR) 25 MG tablet Take 12.5 mg by mouth 2 (two) times daily.  (Patient not taking: Reported on 02/25/2021)     omeprazole (PRILOSEC) 40 MG capsule Take 1 capsule (40 mg total) by mouth daily. (Patient not taking: Reported on 02/25/2021) 30 capsule 1   prazosin (MINIPRESS) 1 MG capsule Take 1 mg by mouth at bedtime. (Patient not taking: Reported on 02/25/2021)     ramipril (ALTACE) 5 MG capsule Take 5 mg by mouth daily. (Patient not taking: Reported on 02/25/2021)     rosuvastatin (CRESTOR) 40 MG tablet Take 1 tablet (40 mg total) by mouth daily. (Patient not taking: Reported on 02/25/2021) 90 tablet 1   sertraline (ZOLOFT) 100 MG tablet Take 100 mg by mouth daily. (Patient not taking: Reported on 02/25/2021)     sitaGLIPtin-metformin (JANUMET) 50-1000 MG tablet Take 2 tablets by mouth every morning. (Patient not taking: Reported on 02/25/2021) 180 tablet 3   predniSONE (DELTASONE) 20 MG tablet Take 3 tablets (60 mg total) by mouth daily with breakfast for  5 days. Start the day after the ER visit 15 tablet 0   No facility-administered medications prior to visit.    No Known Allergies  Review of Systems  Constitutional:  Positive for chills and fatigue. Negative for fever.  HENT:  Positive for congestion. Negative for ear discharge, ear pain, sinus pressure, sinus pain and sore throat.   Respiratory:  Positive for cough and shortness of breath.   Cardiovascular:  Negative for chest pain.  Gastrointestinal:  Negative for diarrhea, nausea and vomiting.       No BM in approx 8 day. States he is still having flatus  Genitourinary:  Negative for dysuria, frequency and hematuria.  Neurological:  Positive for weakness and headaches. Negative for dizziness and light-headedness.      Objective:    Physical Exam Vitals and nursing note reviewed.  Constitutional:      Appearance: Normal appearance.  HENT:     Right Ear: Tympanic membrane, ear canal and external ear normal.     Left Ear: Tympanic membrane, ear canal and external ear normal.     Mouth/Throat:     Mouth: Mucous membranes are dry.     Pharynx: Oropharynx is clear.  Eyes:     Extraocular Movements: Extraocular movements intact.     Pupils: Pupils are equal, round, and reactive to light.     Comments: Wears glasses  Cardiovascular:     Rate and Rhythm: Normal rate and regular rhythm.     Heart sounds: Normal heart sounds.  Pulmonary:     Effort: Pulmonary effort is normal.     Breath sounds: Normal breath sounds.  Abdominal:     General: Bowel sounds are normal. There is no distension.     Palpations: There is no mass.     Tenderness: There is no abdominal tenderness.  Lymphadenopathy:     Cervical: No cervical adenopathy.  Skin:    General: Skin is warm.  Neurological:     Mental Status: He is alert.  Psychiatric:        Mood and Affect: Mood normal.        Behavior: Behavior normal.        Thought Content: Thought content normal.        Judgment: Judgment normal.     BP (!) 106/54    Pulse 79    Temp 97.6 F (36.4 C)    Resp 16    Ht 5\' 8"  (1.727 m)    Wt 174 lb 2 oz (79 kg)    SpO2 95%    BMI 26.48 kg/m  Wt Readings from Last 3 Encounters:  02/25/21 174 lb 2 oz (79 kg)  02/19/21 171 lb 1 oz (77.6 kg)  12/27/17 168 lb (76.2 kg)    Health Maintenance Due  Topic Date Due   Hepatitis C Screening  Never done   Zoster Vaccines- Shingrix (1 of 2) Never done   FOOT EXAM  07/17/2016   OPHTHALMOLOGY EXAM  07/30/2016   HEMOGLOBIN A1C  10/31/2017    There are no preventive care reminders to display for this patient.   Lab Results  Component Value Date   TSH 1.57 10/17/2010   Lab Results  Component Value Date   WBC 8.4 02/19/2021   HGB 13.9 02/19/2021   HCT 41.7 02/19/2021   MCV 86.2 02/19/2021   PLT 243 02/19/2021   Lab Results  Component Value Date   NA 127 (L) 02/19/2021   K 4.5 02/19/2021   CO2 21 (L) 02/19/2021   GLUCOSE 229 (H) 02/19/2021   BUN 14 02/19/2021   CREATININE 1.03 02/19/2021   BILITOT 0.4 02/07/2016   ALKPHOS 44 02/07/2016   AST 17 02/07/2016   ALT 14 02/07/2016   PROT 7.3 02/07/2016   ALBUMIN 4.4 02/07/2016   CALCIUM 8.1 (L) 02/19/2021   ANIONGAP 10 02/19/2021   GFR  75.82 01/20/2016   Lab Results  Component Value Date   CHOL 265 (H) 01/20/2016   Lab Results  Component Value Date   HDL 32.20 (L) 01/20/2016   Lab Results  Component Value Date   LDLCALC 194 (H) 01/20/2016   Lab Results  Component Value Date   TRIG 192.0 (H) 01/20/2016   Lab Results  Component Value Date   CHOLHDL 8 01/20/2016   Lab Results  Component Value Date   HGBA1C 7.6 (H) 04/30/2017       Assessment & Plan:   Problem List Items Addressed This Visit       Cardiovascular and Mediastinum   Controlled type 2 diabetes mellitus with circulatory disorder [facility])    Patient states he checks his glucose 2-3 times daily.  Has not checked like these been sick encourage patient to check glucose he did ask about having a CGM, I  will write the order for patient since he was given self insulin.  He is managed by the [facility] hospital.  Continue following up with them      Relevant Medications   insulin aspart protamine - aspart (NOVOLOG MIX 70/30 FLEXPEN) (70-30) 100 UNIT/ML FlexPen   Continuous Blood Gluc Sensor (FREESTYLE LIBRE 2 SENSOR) MISC   Continuous Blood Gluc Receiver (FREESTYLE LIBRE 2 READER) DEVI     Respiratory   Pneumonia due to COVID-19 virus    Patient was seen in the emergency department diagnosed with pneumonia secondary to COVID-19.  He was giving IV hydration along with steroids and discharged with steroids.  Following up today patient states he is doing the same still having the fatigue cough and some shortness of breath.  We will elect to treat patient with doxycycline 100 mg twice daily for 10 days.  We will repeat blood work today pending lab results.  He will follow-up in approximate 6 weeks for repeat chest x-ray and to reestablish with primary care provider.      Relevant Medications   doxycycline (VIBRA-TABS) 100 MG tablet     Other   Hyponatremia - Primary   Relevant Orders   CBC with Differential/Platelet   Comprehensive metabolic panel   Other constipation    Patient is concerned that he has not had a bowel movement 8 days.  States he is still passing flatus.  He has had marked decrease in intake since being ill this is likely the culprit.  Will obtain 2 view abdomen to make sure he is not full of stool low likelihood of obstruction pending x-ray result      Relevant Orders   DG Abd 2 Views     Meds ordered this encounter  Medications   doxycycline (VIBRA-TABS) 100 MG tablet    Sig: Take 1 tablet (100 mg total) by mouth 2 (two) times daily for 10 days.    Dispense:  20 tablet    Refill:  0    Order Specific Question:   Supervising Provider    Answer:   TOWER, MARNE A [[id number]]   Continuous Blood Gluc Sensor (FREESTYLE LIBRE 2 SENSOR) MISC    Sig: 1 Device by Does not apply route as  directed.    Dispense:  2 each    Refill:  2    Order Specific Question:   Supervising Provider    Answer:   Glori Bickers, MARNE A [[id number]]   Continuous Blood Gluc Receiver (FREESTYLE LIBRE 2 READER) DEVI    Sig: 1 Device by Does not apply route as directed.  Dispense:  1 each    Refill:  0    Order Specific Question:   Supervising Provider    Answer:   Loura Pardon A [[id number]]   This visit occurred during the SARS-CoV-2 public health emergency.  Safety protocols were in place, including screening questions prior to the visit, additional usage of staff PPE, and extensive cleaning of exam room while observing appropriate contact time as indicated for disinfecting solutions.    Romilda Garret, NP

## 2021-02-25 NOTE — Assessment & Plan Note (Signed)
Patient states he checks his glucose 2-3 times daily.  Has not checked like these been sick encourage patient to check glucose he did ask about having a CGM, I will write the order for patient since he was given self insulin.  He is managed by the [facility].  Continue following up with them

## 2021-02-26 ENCOUNTER — Telehealth: Payer: Self-pay

## 2021-02-26 NOTE — Telephone Encounter (Signed)
Noted see result note-home number voicemail is not set up not able to get through, called cell number on file and left a message to call back

## 2021-02-26 NOTE — Telephone Encounter (Signed)
Mrs. Gomillion called in checking the labs for her husband William Walton

## 2021-02-26 NOTE — Telephone Encounter (Signed)
PA freestyle libre submitted through covermymeds. Waiting for reply

## 2021-02-26 NOTE — Telephone Encounter (Signed)
Sent you a message with result and information

## 2021-02-26 NOTE — Telephone Encounter (Signed)
Labs are not reviewed by the provider yet, will send for review

## 2021-02-28 ENCOUNTER — Ambulatory Visit: Payer: Medicare Other | Admitting: Family Medicine

## 2021-02-28 NOTE — Telephone Encounter (Addendum)
PA denied. Patient advised. Patient will check with pharmacy to see the cost for this out of pocket and will let us know if we need to do something different.

## 2021-03-05 ENCOUNTER — Ambulatory Visit (INDEPENDENT_AMBULATORY_CARE_PROVIDER_SITE_OTHER): Payer: Medicare Other | Admitting: Nurse Practitioner

## 2021-03-05 ENCOUNTER — Other Ambulatory Visit: Payer: Self-pay

## 2021-03-05 VITALS — BP 108/54 | HR 87 | Temp 98.2°F | Resp 14 | Ht 68.0 in | Wt 175.0 lb

## 2021-03-05 DIAGNOSIS — J1282 Pneumonia due to coronavirus disease 2019: Secondary | ICD-10-CM | POA: Diagnosis not present

## 2021-03-05 DIAGNOSIS — U071 COVID-19: Secondary | ICD-10-CM | POA: Diagnosis not present

## 2021-03-05 NOTE — Patient Instructions (Signed)
Nice to see you today I need you to finish the antibiotic.  Your lungs sound good and clear. You also look good I think you are fine to be around you family this weekend Keep the appointment with Dr. Darnell Level. That is scheduled and defer to [facility] on diabetes medication until you establish

## 2021-03-05 NOTE — Progress Notes (Signed)
Acute Office Visit  Subjective:    Patient ID: William Walton, male    DOB: 1940/07/05, 81 y.o.   MRN: [id number]  Chief Complaint  Patient presents with   Follow-up    Overall doing better. Wanted to check up and make sure he is not contagious to others. Bowel movements are better-has a normal bowel every other day.    HPI Patient is in today for Recheck  Was seen in the emergency department on 02/19/2021 then followed up with me in office on 02/25/2021 where I placed him on a course of doxycycline antibiotics.   States that he is having family over this weekend and wants to make sure that is it ok to have family over as he has a grand daughter that is pregnant  Checked glucose yesterday morning and it was 100. States he ate breakfast and the other 2 meals and took partial shot before bed and sugar this am was 85  Did have a regular coke and sausage biscuit this morning and rechecked his glucose prior to coming to office and was 190  Past Medical History:  Diagnosis Date   Actinic keratosis    face   Arthritis    CAD (coronary artery disease) 08/2010   STEMI 08/2010 s/p 5v CABG at [facility], cardiac rehab - only did 7/26 sessions   Controlled type 2 diabetes mellitus with circulatory disorder ([place]) 2012   [facility]   ED (erectile dysfunction)    likely medication induced   HLD (hyperlipidemia)    HTN (hypertension)    Hypertension    Mild carotid artery disease ([place]) 10/19/2015   Mild plaque build-up by [place] 10/2015, monitor clinically    Spondyloarthropathy 05/2014   by Xray    Past Surgical History:  Procedure Laterality Date   CATARACT EXTRACTION  11/2010   CORONARY ARTERY BYPASS GRAFT  08/2010   5v, [facility] Dr. Julaine Hua   ESOPHAGOGASTRODUODENOSCOPY  08/2010   normal esophagus, localized gastric erythema   TOTAL KNEE ARTHROPLASTY Left 08/2012   [facility] at [facility]   US ECHOCARDIOGRAPHY  08/2010   mild LV dysfxn (EF 45%) with severe LVH, nl R vent sys fxn    Family History  Problem  Relation Age of Onset   Cancer Father 81       died of prostate or bladder CA, pt unsure   Coronary artery disease Brother 46       bypass   Hyperlipidemia Brother    Hypertension Brother    Coronary artery disease Maternal Uncle 59       MI   Diabetes Neg Hx    Stroke Neg Hx     Social History   Socioeconomic History   Marital status: Married    Spouse name: Not on file   Number of children: 2   Years of education: 4 yr colle   Highest education level: Not on file  Occupational History   Occupation: retired Lobbyist: RETIRED  Tobacco Use   Smoking status: Former    Types: Cigarettes    Quit date: 02/16/1966    Years since quitting: 55.0   Smokeless tobacco: Never  Substance and Sexual Activity   Alcohol use: No   Drug use: No   Sexual activity: Never  Other Topics Concern   Not on file  Social History Narrative   Caffeine: 3-4 cups caffeine free coffee, unsweet tea, diet caffeine free soda   Lives with wife, daughter, son in Sports coach and  granddaughter, 1 dog Bichon   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)   Activity: membership at [employer], previously went 3d/wk (lifting)   Diet: fruits and vegetables, meat and potatoes, water   Social Determinants of Radio broadcast assistant Strain: Not on file  Food Insecurity: Not on file  Transportation Needs: Not on file  Physical Activity: Not on file  Stress: Not on file  Social Connections: Not on file  Intimate Partner Violence: Not on file    Outpatient Medications Prior to Visit  Medication Sig Dispense Refill   albuterol (VENTOLIN HFA) 108 (90 Base) MCG/ACT inhaler Inhale 2 puffs into the lungs every 6 (six) hours as needed for wheezing or shortness of breath. 8 g 0   aspirin 81 MG tablet Take 81 mg by mouth every morning.      Casanthranol-Docusate Sodium (STOOL SOFTENER PLUS PO) Take by mouth.     Continuous Blood Gluc Receiver (FREESTYLE LIBRE 2 READER) DEVI 1 Device by Does not apply route as directed. 1  each 0   Continuous Blood Gluc Sensor (FREESTYLE LIBRE 2 SENSOR) MISC 1 Device by Does not apply route as directed. 2 each 2   doxycycline (VIBRA-TABS) 100 MG tablet Take 1 tablet (100 mg total) by mouth 2 (two) times daily for 10 days. 20 tablet 0   FREESTYLE LITE test strip USE TO TEST BLOOD SUGAR TWICE A DAY AS INSTRUCTED 200 each 2   ondansetron (ZOFRAN-ODT) 8 MG disintegrating tablet Take 1 tablet (8 mg total) by mouth every 8 (eight) hours as needed for nausea or vomiting. 15 tablet 0   amoxicillin (AMOXIL) 500 MG capsule Take 2,000 mg by mouth daily. Take one hour prior to dental treatment (Patient not taking: Reported on 03/05/2021)     gabapentin (NEURONTIN) 300 MG capsule Take 900 mg by mouth 3 (three) times daily. (Patient not taking: Reported on 02/25/2021)     glipiZIDE (GLIPIZIDE XL) 5 MG 24 hr tablet TAKE 1 TABLET IN THE MORNING AND 2 TABLETS IN THE EVENING (Patient not taking: Reported on 02/25/2021) 270 tablet 3   insulin aspart protamine - aspart (NOVOLOG MIX 70/30 FLEXPEN) (70-30) 100 UNIT/ML FlexPen 36 u in the morning and 33 units before supper (Patient not taking: Reported on 03/05/2021)     INVOKANA 300 MG TABS tablet TAKE 1 TABLET EVERY MORNING (Patient not taking: Reported on 02/25/2021) 90 tablet 3   JANUMET XR 50-1000 MG TB24 TAKE 1 TABLET TWICE A DAY (Patient not taking: Reported on 02/25/2021) 180 tablet 0   L-Methylfolate-B6-B12 (FOLTANX) 3-35-2 MG TABS TAKE 1 TABLET TWICE A DAY (Patient not taking: Reported on 02/25/2021) 2 tablet 2   metoprolol tartrate (LOPRESSOR) 25 MG tablet Take 12.5 mg by mouth 2 (two) times daily.  (Patient not taking: Reported on 02/25/2021)     omeprazole (PRILOSEC) 40 MG capsule Take 1 capsule (40 mg total) by mouth daily. (Patient not taking: Reported on 02/25/2021) 30 capsule 1   prazosin (MINIPRESS) 1 MG capsule Take 1 mg by mouth at bedtime. (Patient not taking: Reported on 02/25/2021)     ramipril (ALTACE) 5 MG capsule Take 5 mg by mouth daily.  (Patient not taking: Reported on 02/25/2021)     rosuvastatin (CRESTOR) 40 MG tablet Take 1 tablet (40 mg total) by mouth daily. (Patient not taking: Reported on 02/25/2021) 90 tablet 1   sertraline (ZOLOFT) 100 MG tablet Take 100 mg by mouth daily. (Patient not taking: Reported on 02/25/2021)     sitaGLIPtin-metformin (JANUMET)  50-1000 MG tablet Take 2 tablets by mouth every morning. (Patient not taking: Reported on 02/25/2021) 180 tablet 3   No facility-administered medications prior to visit.    No Known Allergies  Review of Systems  Constitutional:  Positive for fatigue (improved not back to baseline yet). Negative for chills and fever.  HENT:  Negative for congestion, ear discharge, ear pain, sinus pressure, sinus pain and sore throat.   Respiratory:  Positive for cough (improved by 80%. Dry). Negative for shortness of breath.   Cardiovascular:  Negative for chest pain.  Gastrointestinal:  Negative for constipation, diarrhea, nausea and vomiting.       Every 24 hours they are soft and normal  Musculoskeletal:  Negative for arthralgias and myalgias.  Neurological:  Negative for dizziness, light-headedness and headaches.      Objective:    Physical Exam Vitals and nursing note reviewed.  Constitutional:      Appearance: Normal appearance.  HENT:     Right Ear: Tympanic membrane, ear canal and external ear normal.     Left Ear: Tympanic membrane, ear canal and external ear normal.  Cardiovascular:     Rate and Rhythm: Normal rate and regular rhythm.     Heart sounds: Normal heart sounds.  Pulmonary:     Effort: Pulmonary effort is normal.     Breath sounds: Normal breath sounds.  Abdominal:     General: Bowel sounds are normal. There is no distension.     Palpations: There is no mass.     Tenderness: There is no abdominal tenderness.  Neurological:     Mental Status: He is alert.    BP (!) 108/54    Pulse 87    Temp 98.2 F (36.8 C)    Resp 14    Ht 5\' 8"  (1.727 m)    Wt  175 lb (79.4 kg)    SpO2 97%    BMI 26.61 kg/m  Wt Readings from Last 3 Encounters:  03/05/21 175 lb (79.4 kg)  02/25/21 174 lb 2 oz (79 kg)  02/19/21 171 lb 1 oz (77.6 kg)    Health Maintenance Due  Topic Date Due   Hepatitis C Screening  Never done   Zoster Vaccines- Shingrix (1 of 2) Never done   FOOT EXAM  07/17/2016   OPHTHALMOLOGY EXAM  07/30/2016   HEMOGLOBIN A1C  10/31/2017    There are no preventive care reminders to display for this patient.   Lab Results  Component Value Date   TSH 1.57 10/17/2010   Lab Results  Component Value Date   WBC 10.2 02/25/2021   HGB 13.6 02/25/2021   HCT 41.0 02/25/2021   MCV 84.9 02/25/2021   PLT 382.0 02/25/2021   Lab Results  Component Value Date   NA 127 (L) 02/25/2021   K 4.1 02/25/2021   CO2 27 02/25/2021   GLUCOSE 396 (H) 02/25/2021   BUN 26 (H) 02/25/2021   CREATININE 1.21 02/25/2021   BILITOT 0.7 02/25/2021   ALKPHOS 45 02/25/2021   AST 12 02/25/2021   ALT 15 02/25/2021   PROT 6.7 02/25/2021   ALBUMIN 3.6 02/25/2021   CALCIUM 9.2 02/25/2021   ANIONGAP 10 02/19/2021   GFR 56.99 (L) 02/25/2021   Lab Results  Component Value Date   CHOL 265 (H) 01/20/2016   Lab Results  Component Value Date   HDL 32.20 (L) 01/20/2016   Lab Results  Component Value Date   LDLCALC 194 (H) 01/20/2016   Lab Results  Component Value Date   TRIG 192.0 (H) 01/20/2016   Lab Results  Component Value Date   CHOLHDL 8 01/20/2016   Lab Results  Component Value Date   HGBA1C 7.6 (H) 04/30/2017       Assessment & Plan:   Problem List Items Addressed This Visit       Respiratory   Pneumonia due to COVID-19 virus - Primary    Patient came back in for recheck on ambulation.  Patient is having family over this weekend with small children and pregnant family members 1 make sure he was okay.  Patient has 2 pills left of his antibiotic but has improved greatly and assessment and per patient report.  Encouraged patient  antibiotics and follow-up with primary care as scheduled.  Also continue checking his glucose and taking medication as prescribed [facility] currently manages his diabetes.  He does have a history of nonadherence but does take insulin.  Again referred to the [facility] hospital for further management        No orders of the defined types were placed in this encounter.  This visit occurred during the SARS-CoV-2 public health emergency.  Safety protocols were in place, including screening questions prior to the visit, additional usage of staff PPE, and extensive cleaning of exam room while observing appropriate contact time as indicated for disinfecting solutions.   Romilda Garret, NP

## 2021-03-05 NOTE — Assessment & Plan Note (Signed)
Patient came back in for recheck on ambulation.  Patient is having family over this weekend with small children and pregnant family members 1 make sure he was okay.  Patient has 2 pills left of his antibiotic but has improved greatly and assessment and per patient report.  Encouraged patient antibiotics and follow-up with primary care as scheduled.  Also continue checking his glucose and taking medication as prescribed [facility] currently manages his diabetes.  He does have a history of nonadherence but does take insulin.  Again referred to the [facility] hospital for further management

## 2021-03-18 NOTE — Telephone Encounter (Signed)
Lvm for pt to schedule a re establish appt with dr g. And also sent my chart letter

## 2021-03-25 NOTE — Telephone Encounter (Signed)
2nd attempt lvm for pt to schedule re-establish with dr. Darnell Level.

## 2021-04-11 ENCOUNTER — Encounter: Payer: Self-pay | Admitting: Family Medicine

## 2021-04-11 ENCOUNTER — Other Ambulatory Visit: Payer: Self-pay

## 2021-04-11 ENCOUNTER — Ambulatory Visit (INDEPENDENT_AMBULATORY_CARE_PROVIDER_SITE_OTHER): Payer: Medicare Other | Admitting: Family Medicine

## 2021-04-11 VITALS — BP 132/80 | HR 85 | Temp 97.6°F | Ht 66.5 in | Wt 172.0 lb

## 2021-04-11 DIAGNOSIS — Z794 Long term (current) use of insulin: Secondary | ICD-10-CM

## 2021-04-11 DIAGNOSIS — E785 Hyperlipidemia, unspecified: Secondary | ICD-10-CM | POA: Diagnosis not present

## 2021-04-11 DIAGNOSIS — U071 COVID-19: Secondary | ICD-10-CM

## 2021-04-11 DIAGNOSIS — G459 Transient cerebral ischemic attack, unspecified: Secondary | ICD-10-CM

## 2021-04-11 DIAGNOSIS — F4310 Post-traumatic stress disorder, unspecified: Secondary | ICD-10-CM

## 2021-04-11 DIAGNOSIS — E1169 Type 2 diabetes mellitus with other specified complication: Secondary | ICD-10-CM | POA: Diagnosis not present

## 2021-04-11 DIAGNOSIS — I10 Essential (primary) hypertension: Secondary | ICD-10-CM

## 2021-04-11 DIAGNOSIS — I2510 Atherosclerotic heart disease of native coronary artery without angina pectoris: Secondary | ICD-10-CM

## 2021-04-11 DIAGNOSIS — Z77098 Contact with and (suspected) exposure to other hazardous, chiefly nonmedicinal, chemicals: Secondary | ICD-10-CM | POA: Diagnosis not present

## 2021-04-11 DIAGNOSIS — J1282 Pneumonia due to coronavirus disease 2019: Secondary | ICD-10-CM | POA: Diagnosis not present

## 2021-04-11 DIAGNOSIS — E871 Hypo-osmolality and hyponatremia: Secondary | ICD-10-CM | POA: Diagnosis not present

## 2021-04-11 LAB — RENAL FUNCTION PANEL
Albumin: 4.4 g/dL (ref 3.5–5.2)
BUN: 14 mg/dL (ref 6–23)
CO2: 29 mEq/L (ref 19–32)
Calcium: 9.3 mg/dL (ref 8.4–10.5)
Chloride: 101 mEq/L (ref 96–112)
Creatinine, Ser: 1.08 mg/dL (ref 0.40–1.50)
GFR: 65.26 mL/min (ref >60.00)
Glucose, Bld: 145 mg/dL — ABNORMAL HIGH (ref 70–99)
Phosphorus: 3.4 mg/dL (ref 2.3–4.6)
Potassium: 4.1 mEq/L (ref 3.5–5.1)
Sodium: 135 mEq/L (ref 135–145)

## 2021-04-11 LAB — LIPID PANEL
Cholesterol: 240 mg/dL — ABNORMAL HIGH (ref 0–200)
HDL: 35.2 mg/dL — ABNORMAL LOW (ref >39.00)
LDL Cholesterol: 184 mg/dL — ABNORMAL HIGH (ref 0–99)
NonHDL: 204.94
Total CHOL/HDL Ratio: 7
Triglycerides: 105 mg/dL (ref 0.0–149.0)
VLDL: 21 mg/dL (ref 0.0–40.0)

## 2021-04-11 LAB — MICROALBUMIN / CREATININE URINE RATIO
Creatinine,U: 108.5 mg/dL
Microalb Creat Ratio: 1.2 mg/g (ref 0.0–30.0)
Microalb, Ur: 1.3 mg/dL (ref 0.0–1.9)

## 2021-04-11 LAB — HEMOGLOBIN A1C: Hgb A1c MFr Bld: 8.6 % — ABNORMAL HIGH (ref 4.6–6.5)

## 2021-04-11 MED ORDER — FREESTYLE LIBRE 2 READER DEVI: 1.0000 | 0 refills | Status: DC

## 2021-04-11 MED ORDER — FREESTYLE LIBRE 2 SENSOR MISC: 1.0000 | 6 refills | Status: DC

## 2021-04-11 MED ORDER — NOVOLOG MIX 70/30 FLEXPEN (70-30) 100 UNIT/ML ~~LOC~~ SUPN: PEN_INJECTOR | SUBCUTANEOUS | 3 refills | Status: DC

## 2021-04-11 MED ORDER — ASPIRIN 81 MG PO TBEC: 81.0000 mg | DELAYED_RELEASE_TABLET | Freq: Every day | ORAL | Status: AC

## 2021-04-11 NOTE — Progress Notes (Signed)
Patient ID: William Walton, male    DOB: [birth year], 81 y.o.   MRN: [id number]  This visit was conducted in person.  BP 132/80    Pulse 85    Temp 97.6 F (36.4 C) (Temporal)    Ht 5' 6.5" (1.689 m)    Wt 172 lb (78 kg)    SpO2 99%    BMI 27.35 kg/m    CC: re establish care  Subjective:   HPI: William Walton is a 81 y.o. male presenting on 04/11/2021 for Re-establish Care (States he was being seen by [facility].  Pt stopped all meds except insulin due to feeling tired regularly, then [facility]   Last meal was 7:30am - sausage and egg biscuit.   I last saw patient 05/2016. He also previously saw endocrinology Dr Cruzita Lederer for T2DM, last seen 04/2017. At that time he was on janumet XR 1000/50mg  2 tab daily, glipizide ER 5mg  in am and 10mg  in pm, invokana 300mg  daily.   [facility] he stopped taking his medications due to constant fatigue. Currently only taking novolog 70/30 36/33u daily. Was also being treated for PTSD, h/o agent orange exposure during his time in [place]. Since off meds, feels very well.  [facility].   Seen last month at our office for ER f/u of COVID PNA.   Also uses [facility] for dental work.   Home BP readings 120-130/70-80.   DM - does regularly check sugars with freestyle libre 2 - requests refill. Current cbg 137. Compliant with antihyperglycemic regimen which includes: novolog 70/30 36/33u daily. Denies low sugars or hypoglycemic symptoms. Denies paresthesias, blurry vision. Last diabetic eye exam 04/2020 - upcoming appt next month Ellin Mayhew). Glucometer brand (CGM): Freestyle Libre 2. Last foot exam: today. DSME: states he's completed through the [place]. Would be willing to see endo again.  Lab Results  Component Value Date   HGBA1C 8.6 (H) 04/11/2021   Diabetic Foot Exam - Simple   Simple Foot Form Diabetic Foot exam was performed with the  following findings: Yes 04/11/2021 12:07 PM  Visual Inspection No deformities, no ulcerations, no other skin breakdown bilaterally: Yes Sensation Testing Intact to touch and monofilament testing bilaterally: Yes Pulse Check Posterior Tibialis and Dorsalis pulse intact bilaterally: Yes Comments 1+ DP bilaterally Denies claudication    Lab Results  Component Value Date   MICROALBUR 1.3 04/11/2021        Relevant past medical, surgical, family and social history reviewed and updated as indicated. Interim medical history since our last visit reviewed. Allergies and medications reviewed and updated. Outpatient Medications Prior to Visit  Medication Sig Dispense Refill   amoxicillin (AMOXIL) 500 MG capsule Take 500 mg by mouth. Take 2,000 mg by mouth daily.  Take one hour prior to dental treatment.     Continuous Blood Gluc Receiver (FREESTYLE LIBRE 2 READER) DEVI 1 Device by Does not apply route as directed. 1 each 0   Continuous Blood Gluc Sensor (FREESTYLE LIBRE 2 SENSOR) MISC 1 Device by Does not apply route as directed. 2 each 2   insulin aspart protamine - aspart (NOVOLOG MIX 70/30 FLEXPEN) (70-30) 100 UNIT/ML FlexPen 36 u in the morning and 33 units before supper     albuterol (VENTOLIN HFA) 108 (90 Base) MCG/ACT inhaler Inhale 2 puffs into the lungs every 6 (six) hours as needed for  wheezing or shortness of breath. 8 g 0   amoxicillin (AMOXIL) 500 MG capsule Take 2,000 mg by mouth daily. Take one hour prior to dental treatment (Patient not taking: Reported on 03/05/2021)     aspirin 81 MG tablet Take 81 mg by mouth every morning.      Casanthranol-Docusate Sodium (STOOL SOFTENER PLUS PO) Take by mouth.     FREESTYLE LITE test strip USE TO TEST BLOOD SUGAR TWICE A DAY AS INSTRUCTED 200 each 2   gabapentin (NEURONTIN) 300 MG capsule Take 900 mg by mouth 3 (three) times daily. (Patient not taking: Reported on 02/25/2021)     glipiZIDE (GLIPIZIDE XL) 5 MG 24 hr tablet TAKE 1 TABLET IN THE  MORNING AND 2 TABLETS IN THE EVENING (Patient not taking: Reported on 02/25/2021) 270 tablet 3   INVOKANA 300 MG TABS tablet TAKE 1 TABLET EVERY MORNING 90 tablet 3   JANUMET XR 50-1000 MG TB24 TAKE 1 TABLET TWICE A DAY (Patient not taking: Reported on 02/25/2021) 180 tablet 0   L-Methylfolate-B6-B12 (FOLTANX) 3-35-2 MG TABS TAKE 1 TABLET TWICE A DAY (Patient not taking: Reported on 02/25/2021) 2 tablet 2   metoprolol tartrate (LOPRESSOR) 25 MG tablet Take 12.5 mg by mouth 2 (two) times daily.  (Patient not taking: Reported on 02/25/2021)     omeprazole (PRILOSEC) 40 MG capsule Take 1 capsule (40 mg total) by mouth daily. (Patient not taking: Reported on 02/25/2021) 30 capsule 1   prazosin (MINIPRESS) 1 MG capsule Take 1 mg by mouth at bedtime. (Patient not taking: Reported on 02/25/2021)     ramipril (ALTACE) 5 MG capsule Take 5 mg by mouth daily. (Patient not taking: Reported on 02/25/2021)     rosuvastatin (CRESTOR) 40 MG tablet Take 1 tablet (40 mg total) by mouth daily. (Patient not taking: Reported on 02/25/2021) 90 tablet 1   sertraline (ZOLOFT) 100 MG tablet Take 100 mg by mouth daily. (Patient not taking: Reported on 02/25/2021)     sitaGLIPtin-metformin (JANUMET) 50-1000 MG tablet Take 2 tablets by mouth every morning. (Patient not taking: Reported on 02/25/2021) 180 tablet 3   No facility-administered medications prior to visit.     Per HPI unless specifically indicated in ROS section below Review of Systems  Objective:  BP 132/80    Pulse 85    Temp 97.6 F (36.4 C) (Temporal)    Ht 5' 6.5" (1.689 m)    Wt 172 lb (78 kg)    SpO2 99%    BMI 27.35 kg/m   Wt Readings from Last 3 Encounters:  04/11/21 172 lb (78 kg)  03/05/21 175 lb (79.4 kg)  02/25/21 174 lb 2 oz (79 kg)      Physical Exam Vitals and nursing note reviewed.  Constitutional:      Appearance: Normal appearance. He is not ill-appearing.  Eyes:     Extraocular Movements: Extraocular movements intact.      Conjunctiva/sclera: Conjunctivae normal.     Pupils: Pupils are equal, round, and reactive to light.  Cardiovascular:     Rate and Rhythm: Normal rate and regular rhythm.     Pulses: Normal pulses.     Heart sounds: Normal heart sounds. No murmur heard. Pulmonary:     Effort: Pulmonary effort is normal. No respiratory distress.     Breath sounds: Normal breath sounds. No wheezing, rhonchi or rales.  Musculoskeletal:     Right lower leg: No edema.     Left lower leg: No edema.  Comments: See HPI for foot exam if done  Skin:    General: Skin is warm and dry.     Findings: No rash.  Neurological:     Mental Status: He is alert.  Psychiatric:        Mood and Affect: Mood normal.        Behavior: Behavior normal.      Results for orders placed or performed in visit on 04/11/21  Renal function panel  Result Value Ref Range   Sodium 135 135 - 145 mEq/L   Potassium 4.1 3.5 - 5.1 mEq/L   Chloride 101 96 - 112 mEq/L   CO2 29 19 - 32 mEq/L   Albumin 4.4 3.5 - 5.2 g/dL   BUN 14 6 - 23 mg/dL   Creatinine, Ser 1.08 0.40 - 1.50 mg/dL   Glucose, Bld 145 (H) 70 - 99 mg/dL   Phosphorus 3.4 2.3 - 4.6 mg/dL   GFR 65.26 >60.00 mL/min   Calcium 9.3 8.4 - 10.5 mg/dL  Hemoglobin A1c  Result Value Ref Range   Hgb A1c MFr Bld 8.6 (H) 4.6 - 6.5 %  Lipid panel  Result Value Ref Range   Cholesterol 240 (H) 0 - 200 mg/dL   Triglycerides 105.0 0.0 - 149.0 mg/dL   HDL 35.20 (L) >39.00 mg/dL   VLDL 21.0 0.0 - 40.0 mg/dL   LDL Cholesterol 184 (H) 0 - 99 mg/dL   Total CHOL/HDL Ratio 7    NonHDL 204.94   Microalbumin / creatinine urine ratio  Result Value Ref Range   Microalb, Ur 1.3 0.0 - 1.9 mg/dL   Creatinine,U 108.5 mg/dL   Microalb Creat Ratio 1.2 0.0 - 30.0 mg/g    Assessment & Plan:  This visit occurred during the SARS-CoV-2 public health emergency.  Safety protocols were in place, including screening questions prior to the visit, additional usage of staff PPE, and extensive cleaning  of exam room while observing appropriate contact time as indicated for disinfecting solutions.   Problem List Items Addressed This Visit     CAD (coronary artery disease)    H/o MI 2012 s/p 5v CABG.  He was previously on aspirin, invokana, metoprolol 12.5mg  bid, ramipril, and rosuvastatin. He's subsequently stopped all oral medications.  Reviewed risks of stopping cardiac medications including recurrent heart attack. He acknowledges this risk states he will not take a statin. Agrees to start aspirin 81mg  daily.  Will likely re refer to cardiology and cardiology lipid clinic.       Relevant Medications   aspirin 81 MG EC tablet   Other Relevant Orders   Ambulatory referral to Cardiology   HTN (hypertension)    He was previously on prazosin, ramipril, metoprolol. He's come off all medications and feels well with controlled blood pressures. He's also lost 10 lbs in the interim.  Will remain off antihypertensives at this time.       Relevant Medications   aspirin 81 MG EC tablet   Hyperlipidemia associated with type 2 diabetes mellitus (HCC)    Chronic. Refuses statin despite known diabetic and CAD hx. Update FLP.  Will likely need PCSK9i.  The 10-year ASCVD risk score (Arnett DK, et al., 2019) is: 57.6%   Values used to calculate the score:     Age: 60 years     Sex: Male     Is Non-Hispanic African American: No     Diabetic: Yes     Tobacco smoker: Yes     Systolic Blood Pressure:  132 mmHg     Is BP treated: No     HDL Cholesterol: 35.2 mg/dL     Total Cholesterol: 240 mg/dL       Relevant Medications   insulin aspart protamine - aspart (NOVOLOG MIX 70/30 FLEXPEN) (70-30) 100 UNIT/ML FlexPen   aspirin 81 MG EC tablet   Other Relevant Orders   Ambulatory referral to Cardiology   Type 2 diabetes mellitus with other specified complication ([place]) - Primary    He discontinued invokana, janumet, glipizide.  Now only on novolog 70/30 36/33 u daily and reports good control with  this.  Update A1c.  He is using freestyle libre 2 sensor/reader and requests refill.  Will request latest diabetic eye exam.  I asked him to return in 6 weeks for f/u visit.       Relevant Medications   insulin aspart protamine - aspart (NOVOLOG MIX 70/30 FLEXPEN) (70-30) 100 UNIT/ML FlexPen   Continuous Blood Gluc Receiver (FREESTYLE LIBRE 2 READER) DEVI   Continuous Blood Gluc Sensor (FREESTYLE LIBRE 2 SENSOR) MISC   aspirin 81 MG EC tablet   Other Relevant Orders   Renal function panel (Completed)   Hemoglobin A1c (Completed)   Lipid panel (Completed)   Microalbumin / creatinine urine ratio (Completed)   Ambulatory referral to Cardiology   TIA (transient ischemic attack)    Carries this dx - restart aspirin 81mg . Declines statin.       Relevant Medications   aspirin 81 MG EC tablet   PTSD (post-traumatic stress disorder)    H/[facility] this [facility], now off all mood medication and feels well.       History of agent Orange exposure    During his time in [place].       RESOLVED: Hyponatremia    This was driven by hyperglycemia - sodium has now normalized.      RESOLVED: Pneumonia due to COVID-19 virus    Seems fully resolved from this standpoint      Relevant Medications   amoxicillin (AMOXIL) 500 MG capsule     Meds ordered this encounter  Medications   insulin aspart protamine - aspart (NOVOLOG MIX 70/30 FLEXPEN) (70-30) 100 UNIT/ML FlexPen    Sig: Inject 36 Units into the skin daily with breakfast AND 32 Units daily with supper.    Dispense:  60 mL    Refill:  3   Continuous Blood Gluc Receiver (FREESTYLE LIBRE 2 READER) DEVI    Sig: 1 Device by Does not apply route as directed. E11.69    Dispense:  1 each    Refill:  0   Continuous Blood Gluc Sensor (FREESTYLE LIBRE 2 SENSOR) MISC    Sig: 1 Device by Does not apply route as directed. E11.69    Dispense:  2 each    Refill:  6   aspirin 81 MG EC tablet    Sig: Take 1 tablet (81 mg  total) by mouth daily. Swallow whole.   Orders Placed This Encounter  Procedures   Renal function panel   Hemoglobin A1c   Lipid panel   Microalbumin / creatinine urine ratio   Ambulatory referral to Cardiology    Referral Priority:   Routine    Referral Type:   Consultation    Referral Reason:   Specialty Services Required    Requested Specialty:   Cardiology    Number of Visits Requested:   1     Patient Instructions  We will request  records from latest diabetic eye exam ([name] in [place]).  Insulin and freestyle libre 2 refilled. Restart aspirin 81mg  daily.  Labs today - we will be in touch with results.  Return in 6 weeks for follow up visit. Let me know sooner if any trouble.   Follow up plan: Return in about 6 weeks (around 05/23/2021) for follow up visit.  Ria Bush, MD

## 2021-04-11 NOTE — Patient Instructions (Addendum)
We will request records from latest diabetic eye exam ([name] in [place]).  Insulin and freestyle libre 2 refilled. Restart aspirin 81mg  daily.  Labs today - we will be in touch with results.  Return in 6 weeks for follow up visit. Let me know sooner if any trouble.

## 2021-04-12 ENCOUNTER — Encounter: Payer: Self-pay | Admitting: Family Medicine

## 2021-04-12 DIAGNOSIS — Z7739 Contact with and (suspected) exposure to other war theater: Secondary | ICD-10-CM | POA: Insufficient documentation

## 2021-04-12 DIAGNOSIS — F4310 Post-traumatic stress disorder, unspecified: Secondary | ICD-10-CM | POA: Insufficient documentation

## 2021-04-12 DIAGNOSIS — Z77098 Contact with and (suspected) exposure to other hazardous, chiefly nonmedicinal, chemicals: Secondary | ICD-10-CM | POA: Insufficient documentation

## 2021-04-12 NOTE — Assessment & Plan Note (Addendum)
He discontinued invokana, janumet, glipizide.  Now only on novolog 70/30 36/33 u daily and reports good control with this.  Update A1c.  He is using freestyle libre 2 sensor/reader and requests refill.  Will request latest diabetic eye exam.  I asked him to return in 6 weeks for f/u visit.

## 2021-04-12 NOTE — Assessment & Plan Note (Signed)
H/o [facility], now off all mood medication and feels well.

## 2021-04-12 NOTE — Assessment & Plan Note (Signed)
Seems fully resolved from this standpoint

## 2021-04-12 NOTE — Assessment & Plan Note (Addendum)
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  He was previously on aspirin, invokana, metoprolol 12.5mg  bid, ramipril, and rosuvastatin. He's subsequently stopped all oral medications.  Reviewed risks of stopping cardiac medications including recurrent heart attack. He acknowledges this risk states he will not take a statin. Agrees to start aspirin 81mg  daily.  Will likely re refer to cardiology and cardiology lipid clinic.

## 2021-04-12 NOTE — Assessment & Plan Note (Signed)
Carries this dx - restart aspirin 81mg . Declines statin.

## 2021-04-12 NOTE — Assessment & Plan Note (Signed)
During his time in [place].

## 2021-04-12 NOTE — Assessment & Plan Note (Signed)
This was driven by hyperglycemia - sodium has now normalized.

## 2021-04-12 NOTE — Assessment & Plan Note (Signed)
He was previously on prazosin, ramipril, metoprolol. He's come off all medications and feels well with controlled blood pressures. He's also lost 10 lbs in the interim.  Will remain off antihypertensives at this time.

## 2021-04-12 NOTE — Assessment & Plan Note (Addendum)
Chronic. Refuses statin despite known diabetic and CAD hx. Update FLP.  Will likely need PCSK9i.  The 10-year ASCVD risk score (Arnett DK, et al., 2019) is: 57.6%   Values used to calculate the score:     Age: 81 years     Sex: Male     Is Non-Hispanic African American: No     Diabetic: Yes     Tobacco smoker: Yes     Systolic Blood Pressure: 824 mmHg     Is BP treated: No     HDL Cholesterol: 35.2 mg/dL     Total Cholesterol: 240 mg/dL

## 2021-04-19 ENCOUNTER — Telehealth: Payer: Self-pay | Admitting: Family Medicine

## 2021-04-19 DIAGNOSIS — E1169 Type 2 diabetes mellitus with other specified complication: Secondary | ICD-10-CM

## 2021-04-19 DIAGNOSIS — E785 Hyperlipidemia, unspecified: Secondary | ICD-10-CM

## 2021-04-19 DIAGNOSIS — I2510 Atherosclerotic heart disease of native coronary artery without angina pectoris: Secondary | ICD-10-CM

## 2021-04-19 NOTE — Progress Notes (Signed)
See phone note

## 2021-04-19 NOTE — Telephone Encounter (Signed)
-----   Message from Helene Shoe, LPN sent at [phone]  3:13 PM EST ----- ?Pt notified as instructed and voiced understanding. Pt would like referral to heart doctor and the cholesterol clinic preferably in [place] but pt will go to [place] if needed. Pt also wants Lattie Haw to get in touch with T[employer]are so Freestyle Libre 2 can be approved. Pt said he bought himself so could ck BS but patches cost pt $100.00. pt request cb about the Freestyle Libre 2 and the referral appt to heart doctor and cholesterol clinic. Sending note to Dr Danise Mina and Lattie Haw CMA. Pt did say while taking statins previously pt did have slight muscle aches but was not really bad aches.  ?

## 2021-04-19 NOTE — Telephone Encounter (Signed)
Referral placed to cardiology to establish for CAD and for consideration of lipid clinic evaluation in statin refusal.  ? ?Lattie Haw can you check into [employer] 2 PA?  ?

## 2021-04-24 DIAGNOSIS — D3131 Benign neoplasm of right choroid: Secondary | ICD-10-CM | POA: Diagnosis not present

## 2021-04-24 DIAGNOSIS — H26491 Other secondary cataract, right eye: Secondary | ICD-10-CM | POA: Diagnosis not present

## 2021-04-24 DIAGNOSIS — H02889 Meibomian gland dysfunction of unspecified eye, unspecified eyelid: Secondary | ICD-10-CM | POA: Diagnosis not present

## 2021-04-24 DIAGNOSIS — H01009 Unspecified blepharitis unspecified eye, unspecified eyelid: Secondary | ICD-10-CM | POA: Diagnosis not present

## 2021-04-24 DIAGNOSIS — H40013 Open angle with borderline findings, low risk, bilateral: Secondary | ICD-10-CM | POA: Diagnosis not present

## 2021-04-24 DIAGNOSIS — E119 Type 2 diabetes mellitus without complications: Secondary | ICD-10-CM | POA: Diagnosis not present

## 2021-04-24 DIAGNOSIS — H1045 Other chronic allergic conjunctivitis: Secondary | ICD-10-CM | POA: Diagnosis not present

## 2021-04-24 LAB — HM DIABETES EYE EXAM

## 2021-04-30 ENCOUNTER — Encounter: Payer: Self-pay | Admitting: Family Medicine

## 2021-05-26 ENCOUNTER — Ambulatory Visit (INDEPENDENT_AMBULATORY_CARE_PROVIDER_SITE_OTHER): Payer: Medicare Other | Admitting: Family Medicine

## 2021-05-26 ENCOUNTER — Encounter: Payer: Self-pay | Admitting: Family Medicine

## 2021-05-26 VITALS — BP 134/76 | HR 71 | Temp 97.5°F | Ht 66.5 in | Wt 178.0 lb

## 2021-05-26 DIAGNOSIS — Z794 Long term (current) use of insulin: Secondary | ICD-10-CM | POA: Diagnosis not present

## 2021-05-26 DIAGNOSIS — I2510 Atherosclerotic heart disease of native coronary artery without angina pectoris: Secondary | ICD-10-CM | POA: Diagnosis not present

## 2021-05-26 DIAGNOSIS — I10 Essential (primary) hypertension: Secondary | ICD-10-CM

## 2021-05-26 DIAGNOSIS — E785 Hyperlipidemia, unspecified: Secondary | ICD-10-CM

## 2021-05-26 DIAGNOSIS — E1169 Type 2 diabetes mellitus with other specified complication: Secondary | ICD-10-CM | POA: Diagnosis not present

## 2021-05-26 MED ORDER — FREESTYLE LIBRE 2 READER DEVI: 1.0000 | 0 refills | Status: DC

## 2021-05-26 MED ORDER — FREESTYLE LIBRE 2 SENSOR MISC: 1.0000 | 6 refills | Status: DC

## 2021-05-26 NOTE — Patient Instructions (Addendum)
Drop morning novolog 70/30 to 34 units, continue night time dose 32 units.  ?Call the cardiology office to schedule an appointment [phone] - with the heart doctor and the cholesterol clinic.  ?I have reordered Freestyle Libre 2.  ?Return in 2 months for medicare wellness follow up visit  ?

## 2021-05-26 NOTE — Assessment & Plan Note (Addendum)
Continues novolog 70/30 - 36/33u.  ?Reviewing Freestyle Libre 2 data, overall good readings. He has some hypoglycemia between 6am and 6pm, usually 12pm to 6am - will drop 70/30 dose to 34u in am.  ?RTC 2 months CPE and will repeat A1c at that time.  ?

## 2021-05-26 NOTE — Telephone Encounter (Signed)
Submitted PA for [employer] 2 sensor [facility]) and reader [facility]).  Decision pending.  ?

## 2021-05-26 NOTE — Progress Notes (Signed)
? ? Patient ID: William Walton, male    DOB: 01/03/1941, 82 y.o.   MRN: [id number] ? ?This visit was conducted in person. ? ?BP 134/76   Pulse 71   Temp (!) 97.5 ?F (36.4 ?C) (Temporal)   Ht 5' 6.5" (1.689 m)   Wt 178 lb (80.7 kg)   SpO2 96%   BMI 28.30 kg/m?   ? ?CC: DM 6wk f/u visit  ?Subjective:  ? ?HPI: ?William Walton is a 81 y.o. male presenting on 05/26/2021 for Diabetes (Here for 6 wk f/u.) ? ? ?CAD - h/o MI s/p 5v CABG.  ?Previously on aspirin, invokana, prazosin, metoprolol, ramipril, rosuvastatin.  ? ?He stopped most medication due to feeling ill while on them (fatigue) - has felt much better off medications. H/o statin intolerance - myaglias so hesitant to restart this.  ? ?We referred to lipid clinic last visit - appt has not been set up yet as their office had trouble reaching patient. # to call and schedule appt provided today.  ?Unsure if on other statins besides rosuvastatin.  ? ?DM - does regularly check sugars - was using freestyle libre 2 however having trouble getting this approved - he has both Tricare and Medicare. 1 wk average 146, 30d average 144. 7d tive in target 80% (7d), 82% (30d). Compliant with antihyperglycemic regimen which includes: novolog 70/30 36u in am and 32u in pm. Denies low sugars or hypoglycemic symptoms. Denies paresthesias, blurry vision. Last diabetic eye exam 04/2021. Glucometer brand: freestyle libre 2. Last foot exam: 03/2021. DSME: completed through the [place]. ?Lab Results  ?Component Value Date  ? HGBA1C 8.6 (H) 04/11/2021  ? ?Diabetic Foot Exam - Simple   ?No data filed ?  ? ?Lab Results  ?Component Value Date  ? MICROALBUR 1.3 04/11/2021  ?  ? ?   ? ?Relevant past medical, surgical, family and social history reviewed and updated as indicated. Interim medical history since our last visit reviewed. ?Allergies and medications reviewed and updated. ?Outpatient Medications Prior to Visit  ?Medication Sig Dispense Refill  ? aspirin 81 MG EC tablet Take 1 tablet (81 mg  total) by mouth daily. Swallow whole.    ? Continuous Blood Gluc Receiver (FREESTYLE LIBRE 2 READER) DEVI 1 Device by Does not apply route as directed. E11.69 1 each 0  ? Continuous Blood Gluc Sensor (FREESTYLE LIBRE 2 SENSOR) MISC 1 Device by Does not apply route as directed. E11.69 2 each 6  ? insulin aspart protamine - aspart (NOVOLOG MIX 70/30 FLEXPEN) (70-30) 100 UNIT/ML FlexPen Inject 36 Units into the skin daily with breakfast AND 32 Units daily with supper. 60 mL 3  ? insulin aspart protamine - aspart (NOVOLOG MIX 70/30 FLEXPEN) (70-30) 100 UNIT/ML FlexPen Inject 34 Units into the skin daily with breakfast AND 32 Units daily with supper. 60 mL   ? amoxicillin (AMOXIL) 500 MG capsule Take 500 mg by mouth. Take 2,000 mg by mouth daily.  Take one hour prior to dental treatment.    ? ?No facility-administered medications prior to visit.  ?  ? ?Per HPI unless specifically indicated in ROS section below ?Review of Systems ? ?Objective:  ?BP 134/76   Pulse 71   Temp (!) 97.5 ?F (36.4 ?C) (Temporal)   Ht 5' 6.5" (1.689 m)   Wt 178 lb (80.7 kg)   SpO2 96%   BMI 28.30 kg/m?   ?Wt Readings from Last 3 Encounters:  ?05/26/21 178 lb (80.7 kg)  ?04/11/21 172 lb (  78 kg)  ?03/05/21 175 lb (79.4 kg)  ?  ?  ?Physical Exam ?Vitals and nursing note reviewed.  ?Constitutional:   ?   Appearance: Normal appearance. He is not ill-appearing.  ?HENT:  ?   Mouth/Throat:  ?   Mouth: Mucous membranes are moist.  ?   Pharynx: Oropharynx is clear. No oropharyngeal exudate or posterior oropharyngeal erythema.  ?Eyes:  ?   Extraocular Movements: Extraocular movements intact.  ?   Conjunctiva/sclera: Conjunctivae normal.  ?   Pupils: Pupils are equal, round, and reactive to light.  ?Cardiovascular:  ?   Rate and Rhythm: Normal rate and regular rhythm.  ?   Pulses: Normal pulses.  ?   Heart sounds: Normal heart sounds. No murmur heard. ?Pulmonary:  ?   Effort: Pulmonary effort is normal. No respiratory distress.  ?   Breath sounds:  Normal breath sounds. No wheezing, rhonchi or rales.  ?Musculoskeletal:  ?   Right lower leg: No edema.  ?   Left lower leg: No edema.  ?Skin: ?   General: Skin is warm and dry.  ?   Findings: No rash.  ?Neurological:  ?   Mental Status: He is alert.  ?Psychiatric:     ?   Mood and Affect: Mood normal.     ?   Behavior: Behavior normal.  ? ?   ?Results for orders placed or performed in visit on 04/30/21  ?HM DIABETES EYE EXAM  ?Result Value Ref Range  ? HM Diabetic Eye Exam No Retinopathy No Retinopathy  ? ?Lab Results  ?Component Value Date  ? CHOL 240 (H) 04/11/2021  ? HDL 35.20 (L) 04/11/2021  ? LDLCALC 184 (H) 04/11/2021  ? LDLDIRECT 141.0 02/07/2016  ? TRIG 105.0 04/11/2021  ? CHOLHDL 7 04/11/2021  ?  ?Assessment & Plan:  ? ?Problem List Items Addressed This Visit   ? ? CAD (coronary artery disease)  ?  # provided to call and schedule cardiology appointment.  ?  ?  ? HTN (hypertension)  ?  Chronic, stable off medication.  ?  ?  ? Hyperlipidemia associated with type 2 diabetes mellitus ([place])  ?  Not interested in statin as he's concerned this was contributing to fatigue and mild myalgias while on it. Pending appt with cardiology lipid clinic to review options including PCSK9i.  ?  ?  ? Relevant Medications  ? insulin aspart protamine - aspart (NOVOLOG MIX 70/30 FLEXPEN) (70-30) 100 UNIT/ML FlexPen  ? Type 2 diabetes mellitus with other specified complication ([place]) - Primary  ?  Continues novolog 70/30 - 36/33u.  ?Reviewing Freestyle Libre 2 data, overall good readings. He has some hypoglycemia between 6am and 6pm, usually 12pm to 6am - will drop 70/30 dose to 34u in am.  ?RTC 2 months CPE and will repeat A1c at that time.  ?  ?  ? Relevant Medications  ? insulin aspart protamine - aspart (NOVOLOG MIX 70/30 FLEXPEN) (70-30) 100 UNIT/ML FlexPen  ? Continuous Blood Gluc Sensor (FREESTYLE LIBRE 2 SENSOR) MISC  ? Continuous Blood Gluc Receiver (FREESTYLE LIBRE 2 READER) DEVI  ?  ? ?Meds ordered this encounter   ?Medications  ? Continuous Blood Gluc Sensor (FREESTYLE LIBRE 2 SENSOR) MISC  ?  Sig: 1 Device by Does not apply route as directed. E11.69  ?  Dispense:  2 each  ?  Refill:  6  ? Continuous Blood Gluc Receiver (FREESTYLE LIBRE 2 READER) DEVI  ?  Sig: 1 Device by Does not  apply route as directed. E11.69  ?  Dispense:  1 each  ?  Refill:  0  ? ?No orders of the defined types were placed in this encounter. ? ? ? ?Patient Instructions  ?Drop morning novolog 70/30 to 34 units, continue night time dose 32 units.  ?Call the cardiology office to schedule an appointment [phone] - with the heart doctor and the cholesterol clinic.  ?I have reordered Freestyle Libre 2.  ?Return in 2 months for medicare wellness follow up visit  ? ?Follow up plan: ?Return in about 2 months (around 07/26/2021) for medicare wellness visit. ? ?Ria Bush, MD   ?

## 2021-05-26 NOTE — Assessment & Plan Note (Addendum)
#   provided to call and schedule cardiology appointment.  ?

## 2021-05-26 NOTE — Assessment & Plan Note (Signed)
Chronic, stable off medication.  

## 2021-05-26 NOTE — Assessment & Plan Note (Signed)
Not interested in statin as he's concerned this was contributing to fatigue and mild myalgias while on it. Pending appt with cardiology lipid clinic to review options including PCSK9i.  ?

## 2021-05-27 DIAGNOSIS — G4733 Obstructive sleep apnea (adult) (pediatric): Secondary | ICD-10-CM | POA: Insufficient documentation

## 2021-05-27 NOTE — Patient Instructions (Signed)
[name] , ?Thank you for taking time to come for your Medicare Wellness Visit. I appreciate your ongoing commitment to your health goals. Please review the following plan we discussed and let me know if I can assist you in the future.  ? ?Screening recommendations/referrals: ?Colonoscopy: No longer required - continue annual stool kit at [place] ?Recommended yearly ophthalmology/optometry visit for glaucoma screening and checkup ?Recommended yearly dental visit for hygiene and checkup ? ?Vaccinations: ?Influenza vaccine: Declined - recommended annually in fall ?Pneumococcal vaccine: Done 09/17/2011, 08/14/2013 & 01/17/2015  ?Tdap vaccine: Done 12/27/2017 - Repeat in 10 years ?Shingles vaccine: You had Zostavax 02/28/2014 & 02/17/2015; There is a new vaccine now: Shingrix is 2 doses 2-6 months apart and over 90% effective   ?Covid-19: Declined ? ?Advanced directives: Please bring a copy of your health care power of attorney and living will to the office to be added to your chart at your convenience.  ? ?Conditions/risks identified: Keep up the great work being active socially and physically! Aim for 30 minutes of exercise or brisk walking, 6-8 glasses of water, and 5 servings of fruits and vegetables each day.  ? ?Next appointment: Follow up in one year for your annual wellness visit.  ? ?Preventive Care 80 Years and Older, Male ? ?Preventive care refers to lifestyle choices and visits with your health care provider that can promote health and wellness. ?What does preventive care include? ?A yearly physical exam. This is also called an annual well check. ?Dental exams once or twice a year. ?Routine eye exams. Ask your health care provider how often you should have your eyes checked. ?Personal lifestyle choices, including: ?Daily care of your teeth and gums. ?Regular physical activity. ?Eating a healthy diet. ?Avoiding tobacco and drug use. ?Limiting alcohol use. ?Practicing safe sex. ?Taking low doses of aspirin every  day. ?Taking vitamin and mineral supplements as recommended by your health care provider. ?What happens during an annual well check? ?The services and screenings done by your health care provider during your annual well check will depend on your age, overall health, lifestyle risk factors, and family history of disease. ?Counseling  ?Your health care provider may ask you questions about your: ?Alcohol use. ?Tobacco use. ?Drug use. ?Emotional well-being. ?Home and relationship well-being. ?Sexual activity. ?Eating habits. ?History of falls. ?Memory and ability to understand (cognition). ?Work and work Statistician. ?Screening  ?You may have the following tests or measurements: ?Height, weight, and BMI. ?Blood pressure. ?Lipid and cholesterol levels. These may be checked every 5 years, or more frequently if you are over 80 years old. ?Skin check. ?Lung cancer screening. You may have this screening every year starting at age 80 if you have a 30-pack-year history of smoking and currently smoke or have quit within the past 15 years. ?Fecal occult blood test (FOBT) of the stool. You may have this test every year starting at age 80. ?Flexible sigmoidoscopy or colonoscopy. You may have a sigmoidoscopy every 5 years or a colonoscopy every 10 years starting at age 80. ?Prostate cancer screening. Recommendations will vary depending on your family history and other risks. ?Hepatitis C blood test. ?Hepatitis B blood test. ?Sexually transmitted disease (STD) testing. ?Diabetes screening. This is done by checking your blood sugar (glucose) after you have not eaten for a while (fasting). You may have this done every 1-3 years. ?Abdominal aortic aneurysm (AAA) screening. You may need this if you are a current or former smoker. ?Osteoporosis. You may be screened starting at age 80  if you are at high risk. ?Talk with your health care provider about your test results, treatment options, and if necessary, the need for more  tests. ?Vaccines  ?Your health care provider may recommend certain vaccines, such as: ?Influenza vaccine. This is recommended every year. ?Tetanus, diphtheria, and acellular pertussis (Tdap, Td) vaccine. You may need a Td booster every 10 years. ?Zoster vaccine. You may need this after age 80. ?Pneumococcal 13-valent conjugate (PCV13) vaccine. One dose is recommended after age 80. ?Pneumococcal polysaccharide (PPSV23) vaccine. One dose is recommended after age 80. ?Talk to your health care provider about which screenings and vaccines you need and how often you need them. ?This information is not intended to replace advice given to you by your health care provider. Make sure you discuss any questions you have with your health care provider. ?Document Released: 03/01/2015 Document Revised: 10/23/2015 Document Reviewed: 12/04/2014 ?Elsevier Interactive Patient Education ? 2017 [place]. ? ?Fall Prevention in the Home ?Falls can cause injuries. They can happen to people of all ages. There are many things you can do to make your home safe and to help prevent falls. ?What can I do on the outside of my home? ?Regularly fix the edges of walkways and driveways and fix any cracks. ?Remove anything that might make you trip as you walk through a door, such as a raised step or threshold. ?Trim any bushes or trees on the path to your home. ?Use bright outdoor lighting. ?Clear any walking paths of anything that might make someone trip, such as rocks or tools. ?Regularly check to see if handrails are loose or broken. Make sure that both sides of any steps have handrails. ?Any raised decks and porches should have guardrails on the edges. ?Have any leaves, snow, or ice cleared regularly. ?Use sand or salt on walking paths during winter. ?Clean up any spills in your garage right away. This includes oil or grease spills. ?What can I do in the bathroom? ?Use night lights. ?Install grab bars by the toilet and in the tub and shower.  Do not use towel bars as grab bars. ?Use non-skid mats or decals in the tub or shower. ?If you need to sit down in the shower, use a plastic, non-slip stool. ?Keep the floor dry. Clean up any water that spills on the floor as soon as it happens. ?Remove soap buildup in the tub or shower regularly. ?Attach bath mats securely with double-sided non-slip rug tape. ?Do not have throw rugs and other things on the floor that can make you trip. ?What can I do in the bedroom? ?Use night lights. ?Make sure that you have a light by your bed that is easy to reach. ?Do not use any sheets or blankets that are too big for your bed. They should not hang down onto the floor. ?Have a firm chair that has side arms. You can use this for support while you get dressed. ?Do not have throw rugs and other things on the floor that can make you trip. ?What can I do in the kitchen? ?Clean up any spills right away. ?Avoid walking on wet floors. ?Keep items that you use a lot in easy-to-reach places. ?If you need to reach something above you, use a strong step stool that has a grab bar. ?Keep electrical cords out of the way. ?Do not use floor polish or wax that makes floors slippery. If you must use wax, use non-skid floor wax. ?Do not have throw rugs and other things  on the floor that can make you trip. ?What can I do with my stairs? ?Do not leave any items on the stairs. ?Make sure that there are handrails on both sides of the stairs and use them. Fix handrails that are broken or loose. Make sure that handrails are as long as the stairways. ?Check any carpeting to make sure that it is firmly attached to the stairs. Fix any carpet that is loose or worn. ?Avoid having throw rugs at the top or bottom of the stairs. If you do have throw rugs, attach them to the floor with carpet tape. ?Make sure that you have a light switch at the top of the stairs and the bottom of the stairs. If you do not have them, ask someone to add them for you. ?What else  can I do to help prevent falls? ?Wear shoes that: ?Do not have high heels. ?Have rubber bottoms. ?Are comfortable and fit you well. ?Are closed at the toe. Do not wear sandals. ?If you use a stepladder: ?Make sure that it is

## 2021-05-28 ENCOUNTER — Ambulatory Visit (INDEPENDENT_AMBULATORY_CARE_PROVIDER_SITE_OTHER): Payer: Medicare Other

## 2021-05-28 VITALS — Wt 169.0 lb

## 2021-05-28 DIAGNOSIS — Z0000 Encounter for general adult medical examination without abnormal findings: Secondary | ICD-10-CM | POA: Diagnosis not present

## 2021-05-28 NOTE — Progress Notes (Signed)
? ?Subjective:  ? William Walton is a 81 y.o. male who presents for Medicare Annual/Subsequent preventive examination. ? ?Virtual Visit via Telephone Note ? ?I connected with  William Walton on 05/28/21 at  2:45 PM EDT by telephone and verified that I am speaking with the correct person using two identifiers. ? ?Location: ?Patient: Home ?Provider: [facility] ?Persons participating in the virtual visit: patient/Nurse Health Advisor ?  ?I discussed the limitations, risks, security and privacy concerns of performing an evaluation and management service by telephone and the availability of in person appointments. The patient expressed understanding and agreed to proceed. ? ?Interactive audio and video telecommunications were attempted between this nurse and patient, however failed, due to patient having technical difficulties OR patient did not have access to video capability.  We continued and completed visit with audio only. ? ?Some vital signs may be absent or patient reported.  ? ?William Nakama Dionne Ano, LPN  ? ?Review of Systems    ? ?Cardiac Risk Factors include: advanced age (>28mn, >>46women);male gender;diabetes mellitus;dyslipidemia;hypertension;Other (see comment), Risk factor comments: hx of TIA, OSA, CAD, hx of agent orange exposure ? ?   ?Objective:  ?  ?Today's Vitals  ? 05/28/21 1447  ?Weight: 169 lb (76.7 kg)  ? ?Body mass index is 26.87 kg/m?. ? ? ?  05/28/2021  ?  2:53 PM 02/19/2021  ? 11:26 AM 12/27/2017  ?  3:14 PM 01/20/2016  ?  9:13 AM 11/21/2015  ? 11:21 PM  ?Advanced Directives  ?Does Patient Have a Medical Advance Directive? Yes No No Yes No  ?Type of AParamedicof [place] will   [place] will   ?Copy of [place] Chart? No - copy requested   No - copy requested   ?Would patient like information on creating a medical advance directive?   No - Patient declined  No - patient declined information  ? ? ?Current Medications  (verified) ?Outpatient Encounter Medications as of 05/28/2021  ?Medication Sig  ? aspirin 81 MG EC tablet Take 1 tablet (81 mg total) by mouth daily. Swallow whole.  ? Continuous Blood Gluc Receiver (FREESTYLE LIBRE 2 READER) DEVI 1 Device by Does not apply route as directed. E11.69  ? Continuous Blood Gluc Sensor (FREESTYLE LIBRE 2 SENSOR) MISC 1 Device by Does not apply route as directed. E11.69  ? insulin aspart protamine - aspart (NOVOLOG MIX 70/30 FLEXPEN) (70-30) 100 UNIT/ML FlexPen Inject 34 Units into the skin daily with breakfast AND 32 Units daily with supper.  ? ?No facility-administered encounter medications on file as of 05/28/2021.  ? ? ?Allergies (verified) ?Patient has no known allergies.  ? ?History: ?Past Medical History:  ?Diagnosis Date  ? Actinic keratosis   ? face  ? Arthritis   ? CAD (coronary artery disease) 08/2010  ? [facility]  ? Controlled type 2 diabetes mellitus with circulatory disorder ([place] 2012  ? [place]  ? ED (erectile dysfunction)   ? likely medication induced  ? HLD (hyperlipidemia)   ? HTN (hypertension)   ? Hypertension   ? [place] artery disease [place] 10/19[place]  ? Mild plaque [place] by [place], monitor clinically   ? Pneumonia due to COVID-19 virus 02/25/2021  ? Spondyloarthropathy 05/2014  ? by Xray  ? ?Past Surgical History:  ?Procedure Laterality Date  ? CATARACT EXTRACTION  11/2010  ? CORONARY ARTERY  BYPASS GRAFT  08/2010  ? 5v, [facility] Dr. Julaine Hua  ? ESOPHAGOGASTRODUODENOSCOPY  08/2010  ? normal esophagus, localized gastric erythema  ? TOTAL KNEE ARTHROPLASTY Left 08/2012  ? [facility] [place]  ? US ECHOCARDIOGRAPHY  08/2010  ? mild LV dysfxn (EF 45%) with severe LVH, nl R vent sys fxn  ? ?Family History  ?Problem Relation Age of Onset  ? Cancer Father 80  ?     died of prostate or bladder CA, pt unsure  ? Coronary artery disease Brother 2  ?     bypass  ? Hyperlipidemia Brother   ? Hypertension Brother   ? Coronary  artery disease Maternal Uncle 59  ?     MI  ? Diabetes Neg Hx   ? Stroke Neg Hx   ? ?Social History  ? ?Socioeconomic History  ? Marital status: Married  ?  Spouse name: Hoyle Sauer  ? Number of children: 2  ? Years of education: 4 yr colle  ? Highest education level: Not on file  ?Occupational History  ? Occupation: retired Nature conservation officer  ?  Employer: RETIRED  ?Tobacco Use  ? Smoking status: Former  ?  Types: Cigarettes  ?  Quit date: 02/16/1966  ?  Years since quitting: 55.3  ? Smokeless tobacco: Never  ?Substance and Sexual Activity  ? Alcohol use: No  ? Drug use: No  ? Sexual activity: Never  ?Other Topics Concern  ? Not on file  ?Social History Narrative  ? Caffeine: 3-4 cups caffeine free coffee, unsweet tea, diet caffeine free soda  ? Lives with wife and grandson  ? 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 ?Social Determinants of Health  ? ?Financial Resource Strain: Low Risk   ? Difficulty of Paying Living Expenses: Not hard at all  ?Food Insecurity: No Food Insecurity  ? Worried About Charity fundraiser in the Last Year: Never true  ? Ran Out of Food in the Last Year: Never true  ?Transportation Needs: No Transportation Needs  ? Lack of Transportation (Medical): No  ? Lack of Transportation (Non-Medical): No  ?Physical Activity: Sufficiently Active  ? Days of Exercise per Week: 7 days  ? Minutes of Exercise per Session: 30 min  ?Stress: No Stress Concern Present  ? Feeling of Stress : Not at all  ?Social Connections: Socially Integrated  ? Frequency of Communication with Friends and Family: More than three times a week  ? Frequency of Social Gatherings with Friends and Family: More than three times a week  ? Attends Religious Services: More than 4 times per year  ? Active Member of Clubs or Organizations: Yes  ? Attends Archivist Meetings: More than 4 times per year  ? Marital  Status: Married  ? ? ?Tobacco Counseling ?Counseling given: Not Answered ? ? ?Clinical Intake: ? ?Pre-visit preparation completed: Yes ? ?Pain : No/denies pain ? ?  ? ?BMI - recorded: 26.87 ?Nutritional Status: BMI 25 -29 Overweight ?Nutritional Risks: None ?Diabetes: Yes ?CBG done?: No ?Did pt. bring in CBG monitor from home?: No ? ?How often do you need to have someone help you when you read instructions, pamphlets, or other written materials from your doctor or pharmacy?: 1 - Never ? ?Diabetic? Nutrition Risk Assessment: ? ?Has the patient had any N/V/D within the last 2 months?  No  ?Does the patient have any non-healing wounds?  No  ?  Has the patient had any unintentional weight loss or weight gain?  No  ? ?Diabetes: ? ?Is the patient diabetic?  Yes  ?If diabetic, was a CBG obtained today?  No  ?Did the patient bring in their glucometer from home?  No  ?How often do you monitor your CBG's? Frequently - has Freestyle Libre 2.  ? ?Financial Strains and Diabetes Management: ? ?Are you having any financial strains with the device, your supplies or your medication? No .  ?Does the patient want to be seen by Chronic Care Management for management of their diabetes?  No  ?Would the patient like to be referred to a Nutritionist or for Diabetic Management?  No  ? ?Diabetic Exams: ? ?Diabetic Eye Exam: Completed 04/24/2021.  ? ?Diabetic Foot Exam: Completed 04/11/2021. Pt has been advised about the importance in completing this exam. ?Interpreter Needed?: No ? ?Information entered by :: Presly Steinruck, LPN ? ? ?Activities of Daily Living ? ?  05/28/2021  ?  2:53 PM  ?In your present state of health, do you have any difficulty performing the following activities:  ?Hearing? 1  ?Vision? 0  ?Difficulty concentrating or making decisions? 0  ?Walking or climbing stairs? 0  ?Dressing or bathing? 0  ?Doing errands, shopping? 0  ?Preparing Food and eating ? N  ?Using the Toilet? N  ?In the past six months, have you accidently leaked  urine? N  ?Do you have problems with loss of bowel control? N  ?Managing your Medications? N  ?Managing your Finances? N  ?Housekeeping or managing your Housekeeping? N  ? ? ?Patient Care Team: ?Jamesetta Orleans

## 2021-06-02 NOTE — Telephone Encounter (Addendum)
Agree. What do we need to do to get appeal done?  ?

## 2021-06-02 NOTE — Telephone Encounter (Signed)
Patient's wife, Hoyle Sauer, called stating they spoke with [employer] and were told this is the phone number to call to get PA done for [facility] [phone]. I looked at covermymeds and PA was denied but appeal can be done with ok by provider? ?Please update patient/or his wife ?

## 2021-06-03 NOTE — Telephone Encounter (Signed)
Faxed letter and OV notes to [place], Ref: PA case [id number] at [phone]. Decision pending.  ?

## 2021-06-03 NOTE — Telephone Encounter (Signed)
Called and spoke to [facility] with [employer] and was advised that the doctor needs to write a letter and fax to them. Shy stated that the letter needs to state why the doctor disagrees with their decision. Mikey Bussing stated that the doctor needs to send supporting documentation along with the letter. ?I was on the phone for over 25 minutes and transferred 4 times before I ended speaking to the person that could answer my questions. ?Fax # [phone] ?

## 2021-06-03 NOTE — Telephone Encounter (Addendum)
Letter written and in Lisa's box.  ?However patient likely needs to call his insurance to see what pharmacy/medical supply company they use for CGM meters if [employer] doesn't cover this. And then I can send in the order to that company ?

## 2021-06-05 ENCOUNTER — Telehealth: Payer: Self-pay | Admitting: Family Medicine

## 2021-06-05 NOTE — Telephone Encounter (Signed)
Pt's wife called asking about a authorization on a Libre 2. She wants to know if their is any updates on it, wants to speak with the nurse.  ?

## 2021-06-05 NOTE — Telephone Encounter (Signed)
Spoke with pt notifying him we have have spoken with his ins co and faxed the most recent form the sent [place].  So right now we are again waiting to hear if they approve it or not. Pt verbalizes understanding and expresses his thanks for our efforts.  ?

## 2021-06-09 ENCOUNTER — Encounter: Payer: Self-pay | Admitting: Family Medicine

## 2021-07-01 ENCOUNTER — Other Ambulatory Visit: Payer: Self-pay | Admitting: *Deleted

## 2021-07-01 DIAGNOSIS — E1169 Type 2 diabetes mellitus with other specified complication: Secondary | ICD-10-CM

## 2021-07-01 MED ORDER — INSULIN PEN NEEDLE 31G X 6 MM MISC: 3 refills | Status: DC

## 2021-07-01 NOTE — Progress Notes (Signed)
Cardiology Office Note ? ?Date:  07/02/2021  ? ?ID:  William Walton, DOB 1940-07-11, MRN [id number] ? ?PCP:  Ria Bush, MD  ? ?Chief Complaint  ?Patient presents with  ? New Patient (Initial Visit)  ?  Ref by Dr. Danise Mina for CAD, hyperlipidemia associated with diabetes mellitus. Patient establishing care for CAD. Medications reviewed by the patient verbally.   ? ? ?HPI:  ?William Walton is a 81 year old gentleman with past medical history of ?Former smoker ?Type 2 diabetes ?COVID January 2023 ?PAD/aortic atherosclerosis ?Hyperlipidemia, statin myalgia ?CAD, hx of CABG in 2012 at [facility] ?Agent orange exposure/ PTSD followed at the [place] ?Who presents by referral from Dr. Danise Mina for consultation of his coronary disease and hyperlipidemia ? ?Discussed prior cardiac hx ?History of CABG 2012 ?Reports that he was placed on too many medications ?Lack of energy several years ago, attributed this to his medications ?Stopped most of his medications at that time ?A1C started to climb, doctor convinced him to go back on insulin ?Restarted insulin ?Recent lab work reviewed A1C 8.6 ?Total cholesterol 270s ? ?Follow[facility], refused mediactions ?[facility] months [facility] ? ?Feels well, denies any chest pain concerning for angina ? ?CT scan January 2023 aortic atherosclerosis coronary calcification ? ?EKG personally reviewed by myself on todays visit ?Nsr rate 78 no significant ST or T wave changes ? ? ?PMH:   has a past medical history of Actinic keratosis, Arthritis, CAD (coronary artery disease) (08/2010), Controlled type 2 diabetes mellitus with circulatory disorder ([place]) (2012), ED (erectile dysfunction), HLD (hyperlipidemia), HTN (hypertension), Hypertension, Mild carotid artery disease ([place]) (10/19/2015), Pneumonia due to COVID-19 virus (02/25/2021), and Spondyloarthropathy (05/2014). ? ?PSH:    ?Past Surgical History:  ?Procedure Laterality Date  ? CATARACT EXTRACTION  11/2010  ? CORONARY ARTERY BYPASS GRAFT   08/2010  ? 5v, [facility] Dr. Julaine Hua  ? ESOPHAGOGASTRODUODENOSCOPY  08/2010  ? normal esophagus, localized gastric erythema  ? TOTAL KNEE ARTHROPLASTY Left 08/2012  ? [facility] at [place]  ? US ECHOCARDIOGRAPHY  08/2010  ? mild LV dysfxn (EF 45%) with severe LVH, nl R vent sys fxn  ? ? ?Current Outpatient Medications  ?Medication Sig Dispense Refill  ? aspirin 81 MG EC tablet Take 1 tablet (81 mg total) by mouth daily. Swallow whole.    ? Continuous Blood Gluc Receiver (FREESTYLE LIBRE 2 READER) DEVI 1 Device by Does not apply route as directed. E11.69 1 each 0  ? Continuous Blood Gluc Sensor (FREESTYLE LIBRE 2 SENSOR) MISC 1 Device by Does not apply route as directed. E11.69 2 each 6  ? insulin aspart protamine - aspart (NOVOLOG MIX 70/30 FLEXPEN) (70-30) 100 UNIT/ML FlexPen Inject 34 Units into the skin daily with breakfast AND 32 Units daily with supper. 60 mL   ? Insulin Pen Needle 31G X 6 MM MISC Use to inject insulin 2 times a day 200 each 3  ? ?No current facility-administered medications for this visit.  ? ? ?Allergies:   Patient has no known allergies.  ? ?Social History:  The patient  reports that he quit smoking about 55 years ago. His smoking use included cigarettes. He has a 5.00 pack-year smoking history. He has never used smokeless tobacco. He reports that he does not drink alcohol and does not use drugs.  ? ?Family History:   family history includes Cancer (age of onset: 22) in his father; Coronary artery disease (age of onset: 90) in his brother and maternal uncle; Hyperlipidemia in  his brother; Hypertension in his brother.  ? ? ?Review of Systems: ?Review of Systems  ?Constitutional: Negative.   ?HENT: Negative.    ?Respiratory: Negative.    ?Cardiovascular: Negative.   ?Gastrointestinal: Negative.   ?Musculoskeletal: Negative.   ?Neurological: Negative.   ?Psychiatric/Behavioral: Negative.    ?All other systems reviewed and are negative. ? ? ?PHYSICAL EXAM: ?VS:  BP 136/68 (BP Location: Right Arm, Patient  Position: Sitting, Cuff Size: Normal)   Pulse 78   Ht '5\' 8"'$  (1.727 m)   Wt 177 lb (80.3 kg)   SpO2 98%   BMI 26.91 kg/m?  , BMI Body mass index is 26.91 kg/m?Marland Kitchen ?Constitutional:  oriented to person, place, and time. No distress.  ?HENT:  ?Head: Grossly normal ?Eyes:  no discharge. No scleral icterus.  ?Neck: No JVD, no carotid bruits  ?Cardiovascular: Regular rate and rhythm, no murmurs appreciated ?Pulmonary/Chest: Clear to auscultation bilaterally, no wheezes or rails ?Abdominal: Soft.  no distension.  no tenderness.  ?Musculoskeletal: Normal range of motion ?Neurological:  normal muscle tone. Coordination normal. No atrophy ?Skin: Skin warm and dry ?Psychiatric: normal affect, pleasant ? ?Recent Labs: ?02/19/2021: B Natriuretic Peptide 26.0 ?02/25/2021: ALT 15; Hemoglobin 13.6; Platelets 382.0 ?04/11/2021: BUN 14; Creatinine, Ser 1.08; Potassium 4.1; Sodium 135  ? ? ?Lipid Panel ?Lab Results  ?Component Value Date  ? CHOL 240 (H) 04/11/2021  ? HDL 35.20 (L) 04/11/2021  ? LDLCALC 184 (H) 04/11/2021  ? TRIG 105.0 04/11/2021  ? ? ?Wt Readings from Last 3 Encounters:  ?07/02/21 177 lb (80.3 kg)  ?05/28/21 169 lb (76.7 kg)  ?05/26/21 178 lb (80.7 kg)  ?  ? ?ASSESSMENT AND PLAN: ? ?Problem List Items Addressed This Visit   ? ?  ? Cardiology Problems  ? CAD (coronary artery disease) - Primary  ? HTN (hypertension)  ? Hyperlipidemia associated with type 2 diabetes mellitus ([place])  ? Mild carotid artery disease ([place])  ?  ? Other  ? Type 2 diabetes mellitus with other specified complication ([place])  ? ?Other Visit Diagnoses   ? ? Aortic atherosclerosis ([place])      ? Relevant Orders  ? EKG 12-Lead  ? PAD (peripheral artery disease) ([place])      ? Hx of CABG      ? ?  ? ?CABG X5 (08/29/2010) ?Surgical details unavailable at this time ?No intervention since CABG in 2012 ?Prefers not to be on medications, long discussion with him concerning benefits of aggressive diabetes and cholesterol control ?Again prefers no medications ?Denies  sx of angina ?Active at baseline ? ?Diabetes type 2 with complications ?Has refused most medications including metformin, willing to take insulin ? ?Hyperlipidemia ?Discussed various treatment options including statins, Zetia, bempedoic acid, PCSK9 inhibitor even Leqvio ?He is willing to read about the various agents but does not want any new prescription at this time ?Discussed goals for his lipids, he is well above goal ? ?Essential hypertension ?Currently not on medications, feels his blood pressure is well controlled ?Numbers are reasonable on today's visit ?Previously stopped all beta-blockers, ACE /arb ? ? ? Total encounter time more than 60 minutes ? Greater than 50% was spent in counseling and coordination of care with the patient ? ? ? ?Signed, ?Esmond Plants, M.D., Ph.D. ?[facility] [place], [place] ?[phone] ?

## 2021-07-01 NOTE — Telephone Encounter (Signed)
Pt called in asking for med to be sent to [facility]. Pt said he gets his novolog from [facility] but he has a high co-pay and has talked to [facility] and his co-pay would be a lot lower. Pt is asking if PCP will send in a new Rx for med to [facility]. Also pt didn't get Rx for the needles to go with the pen he has been paying out of pocket for them so he is asking a Rx for the pen needles be sent to [facility]. ?

## 2021-07-01 NOTE — Telephone Encounter (Signed)
Last OV:  05/26/21, 6 wk f/u ?Next OV:  07/29/21, CPE ?

## 2021-07-02 ENCOUNTER — Ambulatory Visit (INDEPENDENT_AMBULATORY_CARE_PROVIDER_SITE_OTHER): Payer: Medicare Other | Admitting: Cardiovascular Disease

## 2021-07-02 ENCOUNTER — Encounter: Payer: Self-pay | Admitting: Cardiovascular Disease

## 2021-07-02 VITALS — BP 136/68 | HR 78 | Ht 68.0 in | Wt 177.0 lb

## 2021-07-02 DIAGNOSIS — E1169 Type 2 diabetes mellitus with other specified complication: Secondary | ICD-10-CM

## 2021-07-02 DIAGNOSIS — E785 Hyperlipidemia, unspecified: Secondary | ICD-10-CM

## 2021-07-02 DIAGNOSIS — I700 Atherosclerosis of aorta: Secondary | ICD-10-CM | POA: Diagnosis not present

## 2021-07-02 DIAGNOSIS — I10 Essential (primary) hypertension: Secondary | ICD-10-CM | POA: Diagnosis not present

## 2021-07-02 DIAGNOSIS — I25118 Atherosclerotic heart disease of native coronary artery with other forms of angina pectoris: Secondary | ICD-10-CM

## 2021-07-02 DIAGNOSIS — I779 Disorder of arteries and arterioles, unspecified: Secondary | ICD-10-CM | POA: Diagnosis not present

## 2021-07-02 DIAGNOSIS — Z794 Long term (current) use of insulin: Secondary | ICD-10-CM | POA: Diagnosis not present

## 2021-07-02 DIAGNOSIS — I739 Peripheral vascular disease, unspecified: Secondary | ICD-10-CM

## 2021-07-02 DIAGNOSIS — I2510 Atherosclerotic heart disease of native coronary artery without angina pectoris: Secondary | ICD-10-CM

## 2021-07-02 DIAGNOSIS — Z951 Presence of aortocoronary bypass graft: Secondary | ICD-10-CM

## 2021-07-02 MED ORDER — NOVOLOG MIX 70/30 FLEXPEN (70-30) 100 UNIT/ML ~~LOC~~ SUPN: PEN_INJECTOR | SUBCUTANEOUS | 3 refills | Status: DC

## 2021-07-02 NOTE — Patient Instructions (Signed)
Read about Leqvio, cholesterol shot once every 6 months ? ? ?Medication Instructions:  ?No changes ? ?If you need a refill on your cardiac medications before your next appointment, please call your pharmacy.  ? ?Lab work: ?No new labs needed ? ?Testing/Procedures: ?No new testing needed ? ?Follow-Up: ?At [facility], you and your health needs are our priority.  As part of our continuing mission to provide you with exceptional heart care, we have created designated Provider Care Teams.  These Care Teams include your primary Cardiologist (physician) and Advanced Practice Providers (APPs -  Physician Assistants and Nurse Practitioners) who all work together to provide you with the care you need, when you need it. ? ?You will need a follow up appointment in 12 months ? ?Providers on your designated Care Team:   ?Murray Hodgkins, NP ?Christell Faith, PA-C ?Cadence Kathlen Mody, PA-C ? ?COVID-19 Vaccine Information can be found at: [email or web address] For questions related to vaccine distribution or appointments, please email [email or web address] or call [phone].  ? ?

## 2021-07-02 NOTE — Telephone Encounter (Signed)
ERx 

## 2021-07-03 LAB — COMPREHENSIVE METABOLIC PANEL: eGFR: 61

## 2021-07-03 LAB — LIPID PANEL
Cholesterol: 244 — AB (ref 0–200)
HDL: 36 (ref 35–70)
LDL Cholesterol: 179
Triglycerides: 146 (ref 40–160)

## 2021-07-03 LAB — BASIC METABOLIC PANEL
Creatinine: 1.2 (ref 0.6–1.3)
Potassium: 4.1 mEq/L (ref 3.5–5.1)
Sodium: 135 — AB (ref 137–147)

## 2021-07-03 LAB — HEMOGLOBIN A1C: Hemoglobin A1C: 7.6

## 2021-07-29 ENCOUNTER — Encounter: Payer: Self-pay | Admitting: Family Medicine

## 2021-07-29 ENCOUNTER — Ambulatory Visit (INDEPENDENT_AMBULATORY_CARE_PROVIDER_SITE_OTHER): Payer: Medicare Other | Admitting: Family Medicine

## 2021-07-29 VITALS — BP 134/72 | HR 68 | Temp 97.2°F | Ht 66.5 in | Wt 172.5 lb

## 2021-07-29 DIAGNOSIS — Z794 Long term (current) use of insulin: Secondary | ICD-10-CM | POA: Diagnosis not present

## 2021-07-29 DIAGNOSIS — Z7189 Other specified counseling: Secondary | ICD-10-CM | POA: Diagnosis not present

## 2021-07-29 DIAGNOSIS — I2510 Atherosclerotic heart disease of native coronary artery without angina pectoris: Secondary | ICD-10-CM | POA: Diagnosis not present

## 2021-07-29 DIAGNOSIS — Z77098 Contact with and (suspected) exposure to other hazardous, chiefly nonmedicinal, chemicals: Secondary | ICD-10-CM | POA: Diagnosis not present

## 2021-07-29 DIAGNOSIS — I779 Disorder of arteries and arterioles, unspecified: Secondary | ICD-10-CM | POA: Diagnosis not present

## 2021-07-29 DIAGNOSIS — I10 Essential (primary) hypertension: Secondary | ICD-10-CM

## 2021-07-29 DIAGNOSIS — E785 Hyperlipidemia, unspecified: Secondary | ICD-10-CM | POA: Diagnosis not present

## 2021-07-29 DIAGNOSIS — G4733 Obstructive sleep apnea (adult) (pediatric): Secondary | ICD-10-CM | POA: Diagnosis not present

## 2021-07-29 DIAGNOSIS — E1169 Type 2 diabetes mellitus with other specified complication: Secondary | ICD-10-CM | POA: Diagnosis not present

## 2021-07-29 NOTE — Assessment & Plan Note (Signed)
Has established with Dr Rockey Situ. Appreciate his care.

## 2021-07-29 NOTE — Assessment & Plan Note (Signed)
Declines statin. Saw cardiology, discussed options. Considering starting Leqvio shots - will reach out to cardiology about scheduling this.

## 2021-07-29 NOTE — Assessment & Plan Note (Signed)
History of this, did not tolerate CPAP when previously tried. Has subsequently lost weight and thins no longer an issue.

## 2021-07-29 NOTE — Patient Instructions (Addendum)
Bring [place] a copy of your living will/advanced directive. You are doing well today No labs today Return in 6 months for diabetes check.  Look into cholesterol medicine discussed by Dr Rockey Situ.   Health Maintenance After Age 80 After age 74, you are at a higher risk for certain long-term diseases and infections as well as injuries from falls. Falls are a major cause of broken bones and head injuries in people who are older than age 47. Getting regular preventive care can help to keep you healthy and well. Preventive care includes getting regular testing and making lifestyle changes as recommended by your health care provider. Talk with your health care provider about: Which screenings and tests you should have. A screening is a test that checks for a disease when you have no symptoms. A diet and exercise plan that is right for you. What should I know about screenings and tests to prevent falls? Screening and testing are the best ways to find a health problem early. Early diagnosis and treatment give you the best chance of managing medical conditions that are common after age 80. Certain conditions and lifestyle choices may make you more likely to have a fall. Your health care provider may recommend: Regular vision checks. Poor vision and conditions such as cataracts can make you more likely to have a fall. If you wear glasses, make sure to get your prescription updated if your vision changes. Medicine review. Work with your health care provider to regularly review all of the medicines you are taking, including over-the-counter medicines. Ask your health care provider about any side effects that may make you more likely to have a fall. Tell your health care provider if any medicines that you take make you feel dizzy or sleepy. Strength and balance checks. Your health care provider may recommend certain tests to check your strength and balance while standing, walking, or changing positions. Foot health  exam. Foot pain and numbness, as well as not wearing proper footwear, can make you more likely to have a fall. Screenings, including: Osteoporosis screening. Osteoporosis is a condition that causes the bones to get weaker and break more easily. Blood pressure screening. Blood pressure changes and medicines to control blood pressure can make you feel dizzy. Depression screening. You may be more likely to have a fall if you have a fear of falling, feel depressed, or feel unable to do activities that you used to do. Alcohol use screening. Using too much alcohol can affect your balance and may make you more likely to have a fall. Follow these instructions at home: Lifestyle Do not drink alcohol if: Your health care provider tells you not to drink. If you drink alcohol: Limit how much you have to: 0-1 drink a day for women. 0-2 drinks a day for men. Know how much alcohol is in your drink. In the [place] one drink equals one 12 oz bottle of beer (355 mL), one 5 oz glass of wine (148 mL), or one 1 oz glass of hard liquor (44 mL). Do not use any products that contain nicotine or tobacco. These products include cigarettes, chewing tobacco, and vaping devices, such as e-cigarettes. If you need help quitting, ask your health care provider. Activity  Follow a regular exercise program to stay fit. This will help you maintain your balance. Ask your health care provider what types of exercise are appropriate for you. If you need a cane or walker, use it as recommended by your health care provider. Wear supportive  shoes that have nonskid soles. Safety  Remove any tripping hazards, such as rugs, cords, and clutter. Install safety equipment such as grab bars in bathrooms and safety rails on stairs. Keep rooms and walkways well-lit. General instructions Talk with your health care provider about your risks for falling. Tell your health care provider if: You fall. Be sure to tell your health care provider  about all falls, even ones that seem minor. You feel dizzy, tiredness (fatigue), or off-balance. Take over-the-counter and prescription medicines only as told by your health care provider. These include supplements. Eat a healthy diet and maintain a healthy weight. A healthy diet includes low-fat dairy products, low-fat (lean) meats, and fiber from whole grains, beans, and lots of fruits and vegetables. Stay current with your vaccines. Schedule regular health, dental, and eye exams. Summary Having a healthy lifestyle and getting preventive care can help to protect your health and wellness after age 86. Screening and testing are the best way to find a health problem early and help you avoid having a fall. Early diagnosis and treatment give you the best chance for managing medical conditions that are more common for people who are older than age 11. Falls are a major cause of broken bones and head injuries in people who are older than age 18. Take precautions to prevent a fall at home. Work with your health care provider to learn what changes you can make to improve your health and wellness and to prevent falls. This information is not intended to replace advice given to you by your health care provider. Make sure you discuss any questions you have with your health care provider. Document Revised: 06/24/2020 Document Reviewed: 06/24/2020 Elsevier Patient Education  [place].

## 2021-07-29 NOTE — Progress Notes (Signed)
Patient ID: William Walton, male    DOB: [birth year], 81 y.o.   MRN: [id number]  This visit was conducted in person.  BP 134/72   Pulse 68   Temp (!) 97.2 F (36.2 C)   Ht 5' 6.5" (1.689 m)   Wt 172 lb 8 oz (78.2 kg)   SpO2 98%   BMI 27.43 kg/m    CC: AMW f/u visit  Subjective:   HPI: William Walton is a 81 y.o. male presenting on 07/29/2021 for Annual Exam [facility]. )   Saw health advisor 05/2021 for medicare wellness visit. Note reviewed. Cognitive assessment was performed.   No results found.  Flowsheet Row Clinical Support from 05/28/2021 in [place] at [place]  PHQ-2 Total Score 0          05/28/2021    2:49 PM 09/20/2017   10:39 AM 01/24/2016    4:38 PM 01/20/2016    9:39 AM  [place] in the past year? 0 No Yes Yes  Comment  Emmi Telephone Survey: data to providers prior to load Emmi Telephone Survey: data to providers prior to load 2 accidentals falls secondary to hypotension   Number falls in past yr: 0  2 or more 2 or more  Comment   Emmi Telephone Survey Actual Response = 5   Injury with Fall? 0  Yes Yes  Risk for fall due to : No Fall Risks     Follow up Falls prevention discussed   Falls evaluation completed   CAD - h/o MI s/p 5v CABG. Saw Dr Rockey Situ to establish care last month, note reviewed. To consider [id number] shot.   OSA - previously completed sleep study and recommended CPAP but could never sleep with this. Has lost weight since then. No PNdyspnea, no snoring, endorses restorative sleep.   [facility] time.  Labs 07/03/2021: TC 244, Trig 146, HDL 36 LDL 179.  A1c 7.6% Cr 1.2, Na 135, K 4.1, glu 139, eGFR 61  DM - uses Freestyle Libre 2. Continues novolog 70/30 34/32u daily.  Lab Results  Component Value Date   HGBA1C 7.6 07/03/2021    H/o agent orange exposure with residual problems including dental disease  Preventative: Colon cancer screening - [place]  [facility] No [facility] prostate cancer.  Lung cancer screening - not eligible  Flu shot yearly Td 08/2010, Tdap 12/2017. Pneumovax 2013, prevnar-13 01/2015 Td 2012 zostavax 02/2014, 02/2015 Shingrix - declines Advanced directive planning - has living will at home, is organ donor. HCPOA is wife. Planning to update soon.  Seat belt use discussed Sunscreen use discussed. No changing moles on skin.  Ex smoker - quit 1968.  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 Interim medical history since our last visit reviewed. Allergies and medications reviewed and updated. Outpatient Medications Prior to  Visit  Medication Sig Dispense Refill   aspirin 81 MG EC tablet Take 1 tablet (81 mg total) by mouth daily. Swallow whole.     Continuous Blood Gluc Receiver (FREESTYLE LIBRE 2 READER) DEVI 1 Device by Does not apply route as directed. E11.69 1 each 0   Continuous Blood Gluc Sensor (FREESTYLE LIBRE 2 SENSOR) MISC 1 Device by Does not apply route as directed. E11.69 2 each 6   insulin aspart protamine - aspart (NOVOLOG MIX 70/30 FLEXPEN) (70-30) 100 UNIT/ML FlexPen Inject 34 Units into the skin daily with breakfast AND 32 Units daily with supper. 60 mL 3   Insulin Pen Needle 31G X 6 MM MISC Use to inject insulin 2 times a day 200 each 3   No facility-administered medications prior to visit.     Per HPI unless  specifically indicated in ROS section below Review of Systems  Objective:  BP 134/72   Pulse 68   Temp (!) 97.2 F (36.2 C)   Ht 5' 6.5" (1.689 m)   Wt 172 lb 8 oz (78.2 kg)   SpO2 98%   BMI 27.43 kg/m   Wt Readings from Last 3 Encounters:  07/29/21 172 lb 8 oz (78.2 kg)  07/02/21 177 lb (80.3 kg)  05/28/21 169 lb (76.7 kg)      Physical Exam Vitals and nursing note reviewed.  Constitutional:      General: He is not in acute distress.    Appearance: Normal appearance. He is well-developed. He is not ill-appearing.  HENT:     Head: Normocephalic and atraumatic.     Right Ear: Hearing, tympanic membrane, ear canal and external ear normal.     Left Ear: Hearing, tympanic membrane, ear canal and external ear normal.  Eyes:     General: No scleral icterus.    Extraocular Movements: Extraocular movements intact.     Conjunctiva/sclera: Conjunctivae normal.     Pupils: Pupils are equal, round, and reactive to light.  Neck:     Thyroid: No thyroid mass or thyromegaly.  Cardiovascular:     Rate and Rhythm: Normal rate and regular rhythm.     Pulses: Normal pulses.          Radial pulses are 2+ on the right side and 2+ on the left side.     Heart sounds: Normal heart sounds. No murmur heard. Pulmonary:     Effort: Pulmonary effort is normal. No respiratory distress.     Breath sounds: Normal breath sounds. No wheezing, rhonchi or rales.  Abdominal:     General: Bowel sounds are normal. There is no distension.     Palpations: Abdomen is soft. There is no mass.     Tenderness: There is no abdominal tenderness. There is no guarding or rebound.     Hernia: No hernia is present.  Musculoskeletal:        General: Normal range of motion.     Cervical back: Normal range of motion and neck supple.     Right lower leg: No edema.     Left lower leg: No edema.  Lymphadenopathy:     Cervical: No cervical adenopathy.  Skin:    General: Skin is warm and dry.     Findings: No rash.   Neurological:     General: No focal deficit present.     Mental Status: He is alert and oriented to person, place, and time.  Psychiatric:        Mood and Affect:  Mood normal.        Behavior: Behavior normal.        Thought Content: Thought content normal.        Judgment: Judgment normal.       Results for orders placed or performed in visit on 76/19/50  Basic metabolic panel  Result Value Ref Range   Creatinine 1.2 0.6 - 1.3   Potassium 4.1 3.5 - 5.1 mEq/L   Sodium 135 (A) 137 - 147  Comprehensive metabolic panel  Result Value Ref Range   eGFR 61   Lipid panel  Result Value Ref Range   Triglycerides 146 40 - 160   Cholesterol 244 (A) 0 - 200   HDL 36 35 - 70   LDL Cholesterol 179   Hemoglobin A1c  Result Value Ref Range   Hemoglobin A1C 7.6    Lab Results  Component Value Date   CREATININE 1.2 07/03/2021   BUN 14 04/11/2021   NA 135 (A) 07/03/2021   K 4.1 07/03/2021   CL 101 04/11/2021   CO2 29 04/11/2021   Assessment & Plan:   Problem List Items Addressed This Visit     Advanced care planning/counseling discussion - Primary (Chronic)    Advanced directive planning - has living will at home, is organ donor. HCPOA is wife. Planning to update soon.       CAD (coronary artery disease)    Has established with Dr Rockey Situ. Appreciate his care.      HTN (hypertension)    H/o this. BP adequate off medication      Hyperlipidemia associated with type 2 diabetes mellitus (HCC)    Declines statin. Saw cardiology, discussed options. Considering starting Leqvio shots - will reach out to cardiology about scheduling this.      Type 2 diabetes mellitus with other specified complication (HCC)    Chronic, stable period only on insulin. Continue this.  He also continues [employer] 2 CGM.      Mild carotid artery disease ([place])    No obvious bruit appreciated today.       History of agent Orange exposure   Obstructive sleep apnea of adult    History of this,  did not tolerate CPAP when previously tried. Has subsequently lost weight and thins no longer an issue.          No orders of the defined types were placed in this encounter.  Orders Placed This Encounter  Procedures   Basic metabolic panel    This external order was created through the Results Console.   Comprehensive metabolic panel    This external order was created through the Results Console.   Lipid panel    This external order was created through the Results Console.   Hemoglobin A1c    This external order was created through the Results Console.    Patient instructions: Bring [place] a copy of your living will/advanced directive. You are doing well today No labs today Return in 6 months for diabetes check.  Look into cholesterol medicine discussed by Dr Rockey Situ.   Follow up plan: Return in about 6 months (around 01/28/2022) for follow up visit.  Ria Bush, MD

## 2021-07-29 NOTE — Assessment & Plan Note (Signed)
Chronic, stable period only on insulin. Continue this.  He also continues [employer] 2 CGM.

## 2021-07-29 NOTE — Assessment & Plan Note (Addendum)
H/o this. BP adequate off medication

## 2021-07-29 NOTE — Assessment & Plan Note (Signed)
Advanced directive planning - has living will at home, is organ donor. HCPOA is wife. Planning to update soon.

## 2021-07-29 NOTE — Assessment & Plan Note (Signed)
No obvious bruit appreciated today.

## 2022-01-05 ENCOUNTER — Ambulatory Visit: Payer: Medicare Other | Admitting: Dermatology

## 2022-01-26 ENCOUNTER — Other Ambulatory Visit (HOSPITAL_COMMUNITY): Payer: Self-pay

## 2022-01-26 ENCOUNTER — Telehealth: Payer: Self-pay

## 2022-01-26 NOTE — Telephone Encounter (Signed)
Can we submit prior auth? He completed diabetes education classes through the [place] in 2019. Would see if this phone note or latest office visit would suffice.

## 2022-01-26 NOTE — Telephone Encounter (Signed)
Pharmacy Patient Advocate Encounter   Received notification from [employer] that prior authorization for FreeStyle [place] 2 Reader device is required/requested.  Key [id number]  PA not submitted. Per previous encounters, PA was denied. No documentation found for comprehensive diabetes education program.

## 2022-01-27 NOTE — Telephone Encounter (Signed)
Pharmacy Patient Advocate Encounter   Received notification from [employer] that prior authorization for FreeStyle [place] 2 Reader device is required/requested.   PA submitted on 01/27/22 to [employer] via [place]  Status is pending

## 2022-01-28 ENCOUNTER — Ambulatory Visit: Payer: Medicare Other | Admitting: Family Medicine

## 2022-02-11 NOTE — Telephone Encounter (Signed)
Pharmacy Patient Advocate Encounter  Prior Authorization for [facility] 2 has been approved.    PA# [id number] Effective dates: 12/28/2021 through 01/27/2023

## 2022-02-23 ENCOUNTER — Other Ambulatory Visit: Payer: Self-pay | Admitting: Family Medicine

## 2022-02-23 DIAGNOSIS — E1169 Type 2 diabetes mellitus with other specified complication: Secondary | ICD-10-CM

## 2022-04-30 DIAGNOSIS — H40013 Open angle with borderline findings, low risk, bilateral: Secondary | ICD-10-CM | POA: Diagnosis not present

## 2022-04-30 DIAGNOSIS — H26491 Other secondary cataract, right eye: Secondary | ICD-10-CM | POA: Diagnosis not present

## 2022-04-30 DIAGNOSIS — D3131 Benign neoplasm of right choroid: Secondary | ICD-10-CM | POA: Diagnosis not present

## 2022-04-30 DIAGNOSIS — H1045 Other chronic allergic conjunctivitis: Secondary | ICD-10-CM | POA: Diagnosis not present

## 2022-04-30 DIAGNOSIS — E119 Type 2 diabetes mellitus without complications: Secondary | ICD-10-CM | POA: Diagnosis not present

## 2022-04-30 LAB — HM DIABETES EYE EXAM

## 2022-06-01 ENCOUNTER — Ambulatory Visit (INDEPENDENT_AMBULATORY_CARE_PROVIDER_SITE_OTHER): Payer: Medicare Other

## 2022-06-01 VITALS — Ht 68.0 in | Wt 172.0 lb

## 2022-06-01 DIAGNOSIS — Z0000 Encounter for general adult medical examination without abnormal findings: Secondary | ICD-10-CM | POA: Diagnosis not present

## 2022-06-01 NOTE — Progress Notes (Signed)
I connected with  Lonia Skinner on 06/01/22 by a audio enabled telemedicine application and verified that I am speaking with the correct person using two identifiers.  Patient Location: Home  Provider Location: Office/Clinic  I discussed the limitations of evaluation and management by telemedicine. The patient expressed understanding and agreed to proceed.  Subjective:   DAISY MCNEEL is a 82 y.o. male who presents for Medicare Annual/Subsequent preventive examination.  Review of Systems      Cardiac Risk Factors include: advanced age (>34men, >83 women);hypertension;diabetes mellitus     Objective:    Today's Vitals   06/01/22 0905  Weight: 172 lb (78 kg)  Height:  (1.727 m)   Body mass index is 26.15 kg/m.     06/01/2022    9:18 AM 05/28/2021    2:53 PM 02/19/2021   11:26 AM 12/27/2017    3:14 PM 01/20/2016    9:13 AM 11/21/2015   11:21 PM  Advanced Directives  Does Patient Have a Medical Advance Directive? Yes Yes No No Yes No  Type of Estate agent of ;Living will Healthcare Power of [place];Living will   Healthcare Power of [place];Living will   Copy of Healthcare Power of Attorney in Chart? No - copy requested No - copy requested   No - copy requested   Would patient like information on creating a medical advance directive?    No - Patient declined  No - patient declined information    Current Medications (verified) Outpatient Encounter Medications as of 06/01/2022  Medication Sig   Continuous Blood Gluc Receiver (FREESTYLE LIBRE 2 READER) DEVI 1 Device by Does not apply route as directed. E11.69   Continuous Blood Gluc Sensor (FREESTYLE LIBRE 2 SENSOR) MISC USE AS DIRECTED   insulin aspart protamine - aspart (NOVOLOG MIX 70/30 FLEXPEN) (70-30) 100 UNIT/ML FlexPen Inject 34 Units into the skin daily with breakfast AND 32 Units daily with supper.   Insulin Pen Needle 31G X 6 MM MISC Use to inject insulin 2 times a day   aspirin 81 MG  EC tablet Take 1 tablet (81 mg total) by mouth daily. Swallow whole. (Patient not taking: Reported on 06/01/2022)   No facility-administered encounter medications on file as of 06/01/2022.    Allergies (verified) Patient has no known allergies.   History: Past Medical History:  Diagnosis Date   Actinic keratosis    face   Arthritis    CAD (coronary artery disease) 08/2010   STEMI 08/2010 s/p 5v CABG at [facility], cardiac rehab - only did 7/26 sessions   Controlled type 2 diabetes mellitus with circulatory disorder 2012   [place]   ED (erectile dysfunction)    likely medication induced   HLD (hyperlipidemia)    HTN (hypertension)    Hypertension    Mild carotid artery disease 10/19/2015   Mild plaque build-up by [place] 10/2015, monitor clinically    Pneumonia due to COVID-19 virus 02/25/2021   Spondyloarthropathy 05/2014   by Herby Abraham   Past Surgical History:  Procedure Laterality Date   CATARACT EXTRACTION  11/2010   CORONARY ARTERY BYPASS GRAFT  08/2010   5v, [facility] Dr. Cheri Kearns   ESOPHAGOGASTRODUODENOSCOPY  08/2010   normal esophagus, localized gastric erythema   TOTAL KNEE ARTHROPLASTY Left 08/2012   [facility] at [place]   US ECHOCARDIOGRAPHY  08/2010   mild LV dysfxn (EF 45%) with severe LVH, nl R vent sys fxn   Family History  Problem Relation Age of Onset  Cancer Father 72       died of bladder CA   Coronary artery disease Brother 76       bypass   Hyperlipidemia Brother    Hypertension Brother    Coronary artery disease Maternal Uncle 87       MI   Diabetes Neg Hx    Stroke Neg Hx    Social History   Socioeconomic History   Marital status: Married    Spouse name: Eber Jones   Number of children: 2   Years of education: 4 yr colle   Highest education level: Not on file  Occupational History   Occupation: retired Hydrologist: RETIRED  Tobacco Use   Smoking status: Former    Packs/day: 1.00    Years: 5.00    Additional pack years: 0.00    Total pack years: 5.00    Types:  Cigarettes    Quit date: 02/16/1966    Years since quitting: 56.3   Smokeless tobacco: Never  Vaping Use   Vaping Use: Never used  Substance and Sexual Activity   Alcohol use: No   Drug use: No   Sexual activity: Never  Other Topics Concern   Not on file  Social History Narrative   Caffeine: 3-4 cups caffeine free coffee, unsweet tea, diet caffeine free soda   Lives with wife and grandson   1 dog Pensions consultant (Scientist, 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 [employer], previously went 3d/wk (lifting)Diet: fruits and vegetables, meat and potatoes, water   Social Determinants of Health   Financial Resource Strain: Low Risk  (06/01/2022)   Overall Financial Resource Strain (CARDIA)    Difficulty of Paying Living Expenses: Not hard at all  Food Insecurity: No Food Insecurity (06/01/2022)   Hunger Vital Sign    Worried About Running Out of Food in the Last Year: Never true    Ran Out of Food in the Last Year: Never true  Transportation Needs: No Transportation Needs (06/01/2022)   PRAPARE - Administrator, Civil Service (Medical): No    Lack of Transportation (Non-Medical): No  Physical Activity: Sufficiently Active (06/01/2022)   Exercise Vital Sign    Days of Exercise per Week: 7 days    Minutes of Exercise per Session: 30 min  Stress: No Stress Concern Present (06/01/2022)   [employer] of Occupational Health - Occupational Stress Questionnaire    Feeling of Stress : Not at all  Social Connections: Socially Integrated (06/01/2022)   Social Connection and Isolation Panel [NHANES]    Frequency of Communication with Friends and Family: More than three times a week    Frequency of Social Gatherings with Friends and Family: More than three times a week    Attends Religious Services: More than 4 times per year    Active Member of [employer] or Organizations: Yes    Attends Banker Meetings: Never    Marital Status: Married    Tobacco Counseling Counseling  given: Not Answered   Clinical Intake:  Pre-visit preparation completed: Yes  Pain : No/denies pain     Nutritional Risks: None Diabetes: Yes CBG done?: Yes (continuous monitor -119) CBG resulted in Enter/ Edit results?: No Did pt. bring in CBG monitor from home?: No  How often do you need to have someone help you when you read instructions, pamphlets, or other written materials from your doctor or pharmacy?: 1 - Never  Diabetic?Nutrition Risk Assessment:  Has the patient had any N/V/D within  the last 2 months?  No  Does the patient have any non-healing wounds?  No  Has the patient had any unintentional weight loss or weight gain?  No   Diabetes:  Is the patient diabetic?  Yes  If diabetic, was a CBG obtained today?  Yes  Did the patient bring in their glucometer from home?  No  How often do you monitor your CBG's? On continuous monitor.   Financial Strains and Diabetes Management:  Are you having any financial strains with the device, your supplies or your medication? No .  Does the patient want to be seen by Chronic Care Management for management of their diabetes?  No  Would the patient like to be referred to a Nutritionist or for Diabetic Management?  No   Diabetic Exams:  Diabetic Eye Exam: Completed Marthenia Rolling 04/30/2022 Diabetic Foot Exam: [facility]    Interpreter Needed?: No  Information entered by :: C.Darril Patriarca LPN   Activities of Daily Living    06/01/2022    9:18 AM  In your present state of health, do you have any difficulty performing the following activities:  Hearing? 0  Vision? 0  Difficulty concentrating or making decisions? 0  Walking or climbing stairs? 0  Dressing or bathing? 0  Doing errands, shopping? 0  Preparing Food and eating ? N  Using the Toilet? N  In the past six months, have you accidently leaked urine? N  Do you have problems with loss of bowel control? N  Managing your Medications? N  Managing your  Finances? N  Housekeeping or managing your Housekeeping? N    Patient Care Team: Eustaquio Boyden, MD as PCP - General (Family Medicine) Carlus Pavlov, MD as Consulting Physician (Internal Medicine) Alwyn Pea, MD as Consulting Physician (Cardiology) Isla Pence, OD as Consulting Physician (Optometry) Durene Romans, MD as Consulting Physician (Orthopedic Surgery) Keplinger, Tawny Asal, MD as Resident (Internal Medicine) Deirdre Evener, MD (Dermatology) Concha Pyo, MD as Referring Physician (Sleep Medicine)  Indicate any recent Medical Services you may have received from other than [facility] providers in the past year (date may be approximate).     Assessment:   This is a routine wellness examination for Trevion.  Hearing/Vision screen Hearing Screening - Comments:: No aids Vision Screening - Comments:: Glasses - [facility]  Dietary issues and exercise activities discussed: Current Exercise Habits: Home exercise routine, Type of exercise: walking, Time (Minutes): 30, Frequency (Times/Week): 7, Weekly Exercise (Minutes/Week): 210, Intensity: Mild, Exercise limited by: None identified   Goals Addressed             This Visit's Progress    Patient Stated       No new goals.       Depression Screen    06/01/2022    9:17 AM 05/28/2021    2:51 PM 01/20/2016    9:39 AM  PHQ 2/9 Scores  PHQ - 2 Score 0 0 1  PHQ- 9 Score   7    Fall Risk    06/01/2022    9:09 AM 05/28/2021    2:49 PM 09/20/2017   10:39 AM 01/24/2016    4:38 PM 01/20/2016    9:39 AM  Fall Risk   Falls in the past year? 0 0 No Yes Yes  Comment   Emmi Telephone Survey: data to providers prior to load Emmi Telephone Survey: data to providers prior to load 2 accidentals falls secondary to hypotension   Number falls in  past yr: 0 0  2 or more 2 or more  Comment    Emmi Telephone Survey Actual Response = 5   Injury with Fall? 0 0  Yes Yes  Risk for fall due to : No Fall Risks No Fall Risks      Follow up Falls prevention discussed;Falls evaluation completed Falls prevention discussed   Falls evaluation completed    FALL RISK PREVENTION PERTAINING TO THE HOME:  Any stairs in or around the home? Yes  If so, are there any without handrails? No  Home free of loose throw rugs in walkways, pet beds, electrical cords, etc? Yes  Adequate lighting in your home to reduce risk of falls? Yes   ASSISTIVE DEVICES UTILIZED TO PREVENT FALLS:  Life alert? No  Use of a cane, walker or w/c? No  Grab bars in the bathroom? No  Shower chair or bench in shower? Yes  Elevated toilet seat or a handicapped toilet? Yes    Cognitive Function:    01/20/2016    9:41 AM  MMSE - Mini Mental State Exam  Orientation to time 5  Orientation to Place 5  Registration 3  Attention/ Calculation 0  Recall 2  Recall-comments pt was unable to recall 1 of 3 words  Language- name 2 objects 0  Language- repeat 1  Language- follow 3 step command 2  Language- follow 3 step command-comments pt was unable to follow 1 step of 3 step command  Language- read & follow direction 0  Write a sentence 0  Copy design 0  Total score 18        06/01/2022    9:19 AM 05/28/2021    2:53 PM  6CIT Screen  What Year? 0 points 0 points  What month? 0 points 0 points  What time? 0 points 0 points  Count back from 20 0 points 0 points  Months in reverse 0 points 0 points  Repeat phrase 0 points 2 points  Total Score 0 points 2 points    Immunizations Immunization History  Administered Date(s) Administered   Influenza, High Dose Seasonal PF 11/15/2015   Influenza,inj,Quad PF,6+ Mos 11/17/2012   Influenza-Unspecified 02/16/2018   Pneumococcal Conjugate-13 07/17/2013, 08/14/2013, 01/17/2015   Pneumococcal Polysaccharide-23 06/17/2011, 07/03/2011, 09/17/2011   Td 08/31/2010   Tdap 12/27/2017   Zoster, Live 02/28/2014, 02/17/2015    TDAP status: Up to date  Flu Vaccine status: Declined, Education has been  provided regarding the importance of this vaccine but patient still declined. Advised may receive this vaccine at local pharmacy or Health Dept. Aware to provide a copy of the vaccination record if obtained from local pharmacy or Health Dept. Verbalized acceptance and understanding.  Pneumococcal vaccine status: Up to date  Covid-19 vaccine status: Declined, Education has been provided regarding the importance of this vaccine but patient still declined. Advised may receive this vaccine at local pharmacy or Health Dept.or vaccine clinic. Aware to provide a copy of the vaccination record if obtained from local pharmacy or Health Dept. Verbalized acceptance and understanding.  Qualifies for Shingles Vaccine? Yes   Zostavax completed Yes   Shingrix Completed?: No.    Education has been provided regarding the importance of this vaccine. Patient has been advised to call insurance company to determine out of pocket expense if they have not yet received this vaccine. Advised may also receive vaccine at local pharmacy or Health Dept. Verbalized acceptance and understanding.  Screening Tests Health Maintenance  Topic Date Due   COVID-19  Vaccine (1) Never done   Zoster Vaccines- Shingrix (1 of 2) Never done   HEMOGLOBIN A1C  01/03/2022   Diabetic kidney evaluation - Urine ACR  04/11/2022   FOOT EXAM  04/11/2022   Diabetic kidney evaluation - eGFR measurement  07/04/2022   INFLUENZA VACCINE  09/17/2022   OPHTHALMOLOGY EXAM  04/30/2023   Medicare Annual Wellness (AWV)  06/01/2023   DTaP/Tdap/Td (3 - Td or Tdap) 12/28/2027   Pneumonia Vaccine 69+ Years old  Completed   HPV VACCINES  Aged Out    Health Maintenance  Health Maintenance Due  Topic Date Due   COVID-19 Vaccine (1) Never done   Zoster Vaccines- Shingrix (1 of 2) Never done   HEMOGLOBIN A1C  01/03/2022   Diabetic kidney evaluation - Urine ACR  04/11/2022   FOOT EXAM  04/11/2022    Colorectal cancer screening: No longer required.    Lung Cancer Screening: (Low Dose CT Chest recommended if Age 48-80 years, 30 pack-year currently smoking OR have quit w/in 15years.) does not qualify.   Lung Cancer Screening Referral: no  Additional Screening:  Hepatitis C Screening: does not qualify; Completed no  Vision Screening: Recommended annual ophthalmology exams for early detection of glaucoma and other disorders of the eye. Is the patient up to date with their annual eye exam?  Yes  Who is the provider or what is the name of the office in which the patient attends annual eye exams? [facility] If pt is not established with a provider, would they like to be referred to a provider to establish care? No .   Dental Screening: Recommended annual dental exams for proper oral hygiene  Community Resource Referral / Chronic Care Management: CRR required this visit?  No   CCM required this visit?  No      Plan:     I have personally reviewed and noted the following in the patient's chart:   Medical and social history Use of alcohol, tobacco or illicit drugs  Current medications and supplements including opioid prescriptions. Patient is not currently taking opioid prescriptions. Functional ability and status Nutritional status Physical activity Advanced directives List of other physicians Hospitalizations, surgeries, and ER visits in previous 12 months Vitals Screenings to include cognitive, depression, and falls Referrals and appointments  In addition, I have reviewed and discussed with patient certain preventive protocols, quality metrics, and best practice recommendations. A written personalized care plan for preventive services as well as general preventive health recommendations were provided to patient.     Maryan Puls, LPN   1/61/0960   Nurse Notes: none

## 2022-06-01 NOTE — Patient Instructions (Signed)
[name] , Thank you for taking time to come for your Medicare Wellness Visit. I appreciate your ongoing commitment to your health goals. Please review the following plan we discussed and let me know if I can assist you in the future.   These are the goals we discussed:  Goals      Increase water intake     05/28/2021 -Stay healthy and active and drink 3 bottles of water per day     Patient Stated     No new goals.        This is a list of the screening recommended for you and due dates:  Health Maintenance  Topic Date Due   COVID-19 Vaccine (1) Never done   Zoster (Shingles) Vaccine (1 of 2) Never done   Hemoglobin A1C  01/03/2022   Yearly kidney health urinalysis for diabetes  04/11/2022   Complete foot exam   04/11/2022   Yearly kidney function blood test for diabetes  07/04/2022   Flu Shot  09/17/2022   Eye exam for diabetics  04/30/2023   Medicare Annual Wellness Visit  06/01/2023   DTaP/Tdap/Td vaccine (3 - Td or Tdap) 12/28/2027   Pneumonia Vaccine  Completed   HPV Vaccine  Aged Out    Advanced directives: Please bring a copy of your health care power of attorney and living will to the office to be added to your chart at your convenience.   Conditions/risks identified: none  Next appointment: Follow up in one year for your annual wellness visit. 06/02/23 @ 9:15 am televisit  Preventive Care 65 Years and Older, Male  Preventive care refers to lifestyle choices and visits with your health care provider that can promote health and wellness. What does preventive care include? A yearly physical exam. This is also called an annual well check. Dental exams once or twice a year. Routine eye exams. Ask your health care provider how often you should have your eyes checked. Personal lifestyle choices, including: Daily care of your teeth and gums. Regular physical activity. Eating a healthy diet. Avoiding tobacco and drug use. Limiting alcohol use. Practicing safe  sex. Taking low doses of aspirin every day. Taking vitamin and mineral supplements as recommended by your health care provider. What happens during an annual well check? The services and screenings done by your health care provider during your annual well check will depend on your age, overall health, lifestyle risk factors, and family history of disease. Counseling  Your health care provider may ask you questions about your: Alcohol use. Tobacco use. Drug use. Emotional well-being. Home and relationship well-being. Sexual activity. Eating habits. History of falls. Memory and ability to understand (cognition). Work and work Astronomer. Screening  You may have the following tests or measurements: Height, weight, and BMI. Blood pressure. Lipid and cholesterol levels. These may be checked every 5 years, or more frequently if you are over 28 years old. Skin check. Lung cancer screening. You may have this screening every year starting at age 17 if you have a 30-pack-year history of smoking and currently smoke or have quit within the past 15 years. Fecal occult blood test (FOBT) of the stool. You may have this test every year starting at age 63. Flexible sigmoidoscopy or colonoscopy. You may have a sigmoidoscopy every 5 years or a colonoscopy every 10 years starting at age 75. Prostate cancer screening. Recommendations will vary depending on your family history and other risks. Hepatitis C blood test. Hepatitis B blood test. Sexually  transmitted disease (STD) testing. Diabetes screening. This is done by checking your blood sugar (glucose) after you have not eaten for a while (fasting). You may have this done every 1-3 years. Abdominal aortic aneurysm (AAA) screening. You may need this if you are a current or former smoker. Osteoporosis. You may be screened starting at age 90 if you are at high risk. Talk with your health care provider about your test results, treatment options, and if  necessary, the need for more tests. Vaccines  Your health care provider may recommend certain vaccines, such as: Influenza vaccine. This is recommended every year. Tetanus, diphtheria, and acellular pertussis (Tdap, Td) vaccine. You may need a Td booster every 10 years. Zoster vaccine. You may need this after age 61. Pneumococcal 13-valent conjugate (PCV13) vaccine. One dose is recommended after age 75. Pneumococcal polysaccharide (PPSV23) vaccine. One dose is recommended after age 20. Talk to your health care provider about which screenings and vaccines you need and how often you need them. This information is not intended to replace advice given to you by your health care provider. Make sure you discuss any questions you have with your health care provider. Document Released: 03/01/2015 Document Revised: 10/23/2015 Document Reviewed: 12/04/2014 Elsevier Interactive Patient Education  2017 [employer].  Fall Prevention in the Home Falls can cause injuries. They can happen to people of all ages. There are many things you can do to make your home safe and to help prevent falls. What can I do on the outside of my home? Regularly fix the edges of walkways and driveways and fix any cracks. Remove anything that might make you trip as you walk through a door, such as a raised step or threshold. Trim any bushes or trees on the path to your home. Use bright outdoor lighting. Clear any walking paths of anything that might make someone trip, such as rocks or tools. Regularly check to see if handrails are loose or broken. Make sure that both sides of any steps have handrails. Any raised decks and porches should have guardrails on the edges. Have any leaves, snow, or ice cleared regularly. Use sand or salt on walking paths during winter. Clean up any spills in your garage right away. This includes oil or grease spills. What can I do in the bathroom? Use night lights. Install grab bars by the toilet  and in the tub and shower. Do not use towel bars as grab bars. Use non-skid mats or decals in the tub or shower. If you need to sit down in the shower, use a plastic, non-slip stool. Keep the floor dry. Clean up any water that spills on the floor as soon as it happens. Remove soap buildup in the tub or shower regularly. Attach bath mats securely with double-sided non-slip rug tape. Do not have throw rugs and other things on the floor that can make you trip. What can I do in the bedroom? Use night lights. Make sure that you have a light by your bed that is easy to reach. Do not use any sheets or blankets that are too big for your bed. They should not hang down onto the floor. Have a firm chair that has side arms. You can use this for support while you get dressed. Do not have throw rugs and other things on the floor that can make you trip. What can I do in the kitchen? Clean up any spills right away. Avoid walking on wet floors. Keep items that you use  a lot in easy-to-reach places. If you need to reach something above you, use a strong step stool that has a grab bar. Keep electrical cords out of the way. Do not use floor polish or wax that makes floors slippery. If you must use wax, use non-skid floor wax. Do not have throw rugs and other things on the floor that can make you trip. What can I do with my stairs? Do not leave any items on the stairs. Make sure that there are handrails on both sides of the stairs and use them. Fix handrails that are broken or loose. Make sure that handrails are as long as the stairways. Check any carpeting to make sure that it is firmly attached to the stairs. Fix any carpet that is loose or worn. Avoid having throw rugs at the top or bottom of the stairs. If you do have throw rugs, attach them to the floor with carpet tape. Make sure that you have a light switch at the top of the stairs and the bottom of the stairs. If you do not have them, ask someone to add  them for you. What else can I do to help prevent falls? Wear shoes that: Do not have high heels. Have rubber bottoms. Are comfortable and fit you well. Are closed at the toe. Do not wear sandals. If you use a stepladder: Make sure that it is fully opened. Do not climb a closed stepladder. Make sure that both sides of the stepladder are locked into place. Ask someone to hold it for you, if possible. Clearly mark and make sure that you can see: Any grab bars or handrails. First and last steps. Where the edge of each step is. Use tools that help you move around (mobility aids) if they are needed. These include: Canes. Walkers. Scooters. Crutches. Turn on the lights when you go into a dark area. Replace any light bulbs as soon as they burn out. Set up your furniture so you have a clear path. Avoid moving your furniture around. If any of your floors are uneven, fix them. If there are any pets around you, be aware of where they are. Review your medicines with your doctor. Some medicines can make you feel dizzy. This can increase your chance of falling. Ask your doctor what other things that you can do to help prevent falls. This information is not intended to replace advice given to you by your health care provider. Make sure you discuss any questions you have with your health care provider. Document Released: 11/29/2008 Document Revised: 07/11/2015 Document Reviewed: 03/09/2014 Elsevier Interactive Patient Education  2017 [employer].

## 2022-08-31 ENCOUNTER — Ambulatory Visit (INDEPENDENT_AMBULATORY_CARE_PROVIDER_SITE_OTHER): Payer: Medicare Other | Admitting: Dermatology

## 2022-08-31 VITALS — BP 142/72 | HR 72

## 2022-08-31 DIAGNOSIS — D229 Melanocytic nevi, unspecified: Secondary | ICD-10-CM

## 2022-08-31 DIAGNOSIS — L578 Other skin changes due to chronic exposure to nonionizing radiation: Secondary | ICD-10-CM

## 2022-08-31 DIAGNOSIS — L820 Inflamed seborrheic keratosis: Secondary | ICD-10-CM | POA: Diagnosis not present

## 2022-08-31 DIAGNOSIS — L821 Other seborrheic keratosis: Secondary | ICD-10-CM | POA: Diagnosis not present

## 2022-08-31 DIAGNOSIS — W908XXA Exposure to other nonionizing radiation, initial encounter: Secondary | ICD-10-CM | POA: Diagnosis not present

## 2022-08-31 DIAGNOSIS — Z1283 Encounter for screening for malignant neoplasm of skin: Secondary | ICD-10-CM | POA: Diagnosis not present

## 2022-08-31 DIAGNOSIS — L570 Actinic keratosis: Secondary | ICD-10-CM

## 2022-08-31 DIAGNOSIS — L918 Other hypertrophic disorders of the skin: Secondary | ICD-10-CM

## 2022-08-31 DIAGNOSIS — D1801 Hemangioma of skin and subcutaneous tissue: Secondary | ICD-10-CM | POA: Diagnosis not present

## 2022-08-31 DIAGNOSIS — L814 Other melanin hyperpigmentation: Secondary | ICD-10-CM | POA: Diagnosis not present

## 2022-08-31 NOTE — Patient Instructions (Signed)
 Cryotherapy Aftercare  Wash gently with soap and water everyday.   Apply Vaseline and Band-Aid daily until healed.     Due to recent changes in healthcare laws, you may see results of your pathology and/or laboratory studies on MyChart before the doctors have had a chance to review them. We understand that in some cases there may be results that are confusing or concerning to you. Please understand that not all results are received at the same time and often the doctors may need to interpret multiple results in order to provide you with the best plan of care or course of treatment. Therefore, we ask that you please give us 2 business days to thoroughly review all your results before contacting the office for clarification. Should we see a critical lab result, you will be contacted sooner.   If You Need Anything After Your Visit  If you have any questions or concerns for your doctor, please call our main line at [phone] and press option 4 to reach your doctor's medical assistant. If no one answers, please leave a voicemail as directed and we will return your call as soon as possible. Messages left after 4 pm will be answered the following business day.   You may also send us a message via MyChart. We typically respond to MyChart messages within 1-2 business days.  For prescription refills, please ask your pharmacy to contact our office. Our fax number is [phone].  If you have an urgent issue when the clinic is closed that cannot wait until the next business day, you can page your doctor at the number below.    Please note that while we do our best to be available for urgent issues outside of office hours, we are not available 24/7.   If you have an urgent issue and are unable to reach us, you may choose to seek medical care at your doctor's office, retail clinic, urgent care center, or emergency room.  If you have a medical emergency, please immediately call 911 or go to the  emergency department.  Pager Numbers  - Dr. Kowalski: [phone]  - Dr. Moye: [phone]  - Dr. Stewart: [phone]  In the event of inclement weather, please call our main line at [phone] for an update on the status of any delays or closures.  Dermatology Medication Tips: Please keep the boxes that topical medications come in in order to help keep track of the instructions about where and how to use these. Pharmacies typically print the medication instructions only on the boxes and not directly on the medication tubes.   If your medication is too expensive, please contact our office at [phone] option 4 or send us a message through MyChart.   We are unable to tell what your co-pay for medications will be in advance as this is different depending on your insurance coverage. However, we may be able to find a substitute medication at lower cost or fill out paperwork to get insurance to cover a needed medication.   If a prior authorization is required to get your medication covered by your insurance company, please allow us 1-2 business days to complete this process.  Drug prices often vary depending on where the prescription is filled and some pharmacies may offer cheaper prices.  The website www.goodrx.com contains coupons for medications through different pharmacies. The prices here do not account for what the cost may be with help from insurance (it may be cheaper with your insurance), but the website can   give you the price if you did not use any insurance.  - You can print the associated coupon and take it with your prescription to the pharmacy.  - You may also stop by our office during regular business hours and pick up a GoodRx coupon card.  - If you need your prescription sent electronically to a different pharmacy, notify our office through [place] MyChart or by phone at [phone] option 4.     Si Usted Necesita Algo Despus de Su Visita  Tambin puede  enviarnos un mensaje a travs de MyChart. Por lo general respondemos a los mensajes de MyChart en el transcurso de 1 a 2 das hbiles.  Para renovar recetas, por favor pida a su farmacia que se ponga en contacto con nuestra oficina. Nuestro nmero de fax es el [phone].  Si tiene un asunto urgente cuando la clnica est cerrada y que no puede esperar hasta el siguiente da hbil, puede llamar/localizar a su doctor(a) al nmero que aparece a continuacin.   Por favor, tenga en cuenta que aunque hacemos todo lo posible para estar disponibles para asuntos urgentes fuera del horario de oficina, no estamos disponibles las 24 horas del da, los 7 das de la semana.   Si tiene un problema urgente y no puede comunicarse con nosotros, puede optar por buscar atencin mdica  en el consultorio de su doctor(a), en una clnica privada, en un centro de atencin urgente o en una sala de emergencias.  Si tiene una emergencia mdica, por favor llame inmediatamente al 911 o vaya a la sala de emergencias.  Nmeros de bper  - Dr. Kowalski: [phone]  - Dra. Moye: [phone]  - Dra. Stewart: [phone]  En caso de inclemencias del tiempo, por favor llame a nuestra lnea principal al [phone] para una actualizacin sobre el estado de cualquier retraso o cierre.  Consejos para la medicacin en dermatologa: Por favor, guarde las cajas en las que vienen los medicamentos de uso tpico para ayudarle a seguir las instrucciones sobre dnde y cmo usarlos. Las farmacias generalmente imprimen las instrucciones del medicamento slo en las cajas y no directamente en los tubos del medicamento.   Si su medicamento es muy caro, por favor, pngase en contacto con nuestra oficina llamando al [phone] y presione la opcin 4 o envenos un mensaje a travs de MyChart.   No podemos decirle cul ser su copago por los medicamentos por adelantado ya que esto es diferente dependiendo de la cobertura de su seguro.  Sin embargo, es posible que podamos encontrar un medicamento sustituto a menor costo o llenar un formulario para que el seguro cubra el medicamento que se considera necesario.   Si se requiere una autorizacin previa para que su compaa de seguros cubra su medicamento, por favor permtanos de 1 a 2 das hbiles para completar este proceso.  Los precios de los medicamentos varan con frecuencia dependiendo del lugar de dnde se surte la receta y alguna farmacias pueden ofrecer precios ms baratos.  El sitio web www.goodrx.com tiene cupones para medicamentos de diferentes farmacias. Los precios aqu no tienen en cuenta lo que podra costar con la ayuda del seguro (puede ser ms barato con su seguro), pero el sitio web puede darle el precio si no utiliz ningn seguro.  - Puede imprimir el cupn correspondiente y llevarlo con su receta a la farmacia.  - Tambin puede pasar por nuestra oficina durante el horario de atencin regular y recoger una tarjeta de cupones de GoodRx.  -   Si necesita que su receta se enve electrnicamente a una farmacia diferente, informe a nuestra oficina a travs de MyChart de [place] o por telfono llamando al [phone] y presione la opcin 4.  

## 2022-08-31 NOTE — Progress Notes (Signed)
Follow-Up Visit   Subjective  William Walton is a 82 y.o. male who presents for the following: Skin Cancer Screening and Upper Body Skin Exam  The patient presents for Upper Body Skin Exam (UBSE) for skin cancer screening and mole check. The patient has spots, moles and lesions to be evaluated, some may be new or changing. He has several dark spots on his abdomen to check, not bothersome. He has a spot on his forehead that he scratches and some scaly spots on his arm/hand to check. History of AKs. No history of skin cancer.     The following portions of the chart were reviewed this encounter and updated as appropriate: medications, allergies, medical history  Review of Systems:  No other skin or systemic complaints except as noted in HPI or Assessment and Plan.  Objective  Well appearing patient in no apparent distress; mood and affect are within normal limits.  All skin waist up examined. Relevant physical exam findings are noted in the Assessment and Plan.  L upper forehead x 1, L hand dorsum x 1, R upper forearm x 2 (4) Erythematous stuck-on, waxy papule  crown x 1, R hand dorsum x 2 (3) Pink scaly macules.    Assessment & Plan   Inflamed seborrheic keratosis (4) L upper forehead x 1, L hand dorsum x 1, R upper forearm x 2  Symptomatic, irritating, patient would like treated.  Destruction of lesion - L upper forehead x 1, L hand dorsum x 1, R upper forearm x 2 (4)  Destruction method: cryotherapy   Informed consent: discussed and consent obtained   Lesion destroyed using liquid nitrogen: Yes   Region frozen until ice ball extended beyond lesion: Yes   Outcome: patient tolerated procedure well with no complications   Post-procedure details: wound care instructions given   Additional details:  Prior to procedure, discussed risks of blister formation, small wound, skin dyspigmentation, or rare scar following cryotherapy. Recommend Vaseline ointment to treated areas while  healing.   AK (actinic keratosis) (3) crown x 1, R hand dorsum x 2  Actinic keratoses are precancerous spots that appear secondary to cumulative UV radiation exposure/sun exposure over time. They are chronic with expected duration over 1 year. A portion of actinic keratoses will progress to squamous cell carcinoma of the skin. It is not possible to reliably predict which spots will progress to skin cancer and so treatment is recommended to prevent development of skin cancer.  Recommend daily broad spectrum sunscreen SPF 30+ to sun-exposed areas, reapply every 2 hours as needed.  Recommend staying in the shade or wearing long sleeves, sun glasses (UVA+UVB protection) and wide brim hats (4-inch brim around the entire circumference of the hat). Call for new or changing lesions.  Destruction of lesion - crown x 1, R hand dorsum x 2 (3)  Destruction method: cryotherapy   Informed consent: discussed and consent obtained   Lesion destroyed using liquid nitrogen: Yes   Region frozen until ice ball extended beyond lesion: Yes   Outcome: patient tolerated procedure well with no complications   Post-procedure details: wound care instructions given   Additional details:  Prior to procedure, discussed risks of blister formation, small wound, skin dyspigmentation, or rare scar following cryotherapy. Recommend Vaseline ointment to treated areas while healing.    Skin cancer screening performed today.  Actinic Damage - Chronic condition, secondary to cumulative UV/sun exposure - diffuse scaly erythematous macules with underlying dyspigmentation - Recommend daily broad spectrum sunscreen  SPF 30+ to sun-exposed areas, reapply every 2 hours as needed.  - Staying in the shade or wearing long sleeves, sun glasses (UVA+UVB protection) and wide brim hats (4-inch brim around the entire circumference of the hat) are also recommended for sun protection.  - Call for new or changing lesions.  Lentigines,  Seborrheic Keratoses, Hemangiomas (abdomen- red papules) - Benign normal skin lesions - Benign-appearing - Call for any changes  Melanocytic Nevi - Tan-brown and/or pink-flesh-colored symmetric macules and papules - Benign appearing on exam today - Observation - Call clinic for new or changing moles - Recommend daily use of broad spectrum spf 30+ sunscreen to sun-exposed areas.   Acrochordons (Skin Tags) - Fleshy, skin-colored pedunculated papule of the right lower axilla - Benign appearing.  - Observe. - If desired, they can be removed with an in office procedure that is not covered by insurance. May consider on follow-up. - Please call the clinic if you notice any new or changing lesions.    Return in about 6 months (around 03/03/2023), or with Dr Kirtland Bouchard, for Hx AKs, skin tag removal?.  I, Cherlyn Labella, CMA, am acting as scribe for Willeen Niece, MD .   Documentation: I have reviewed the above documentation for accuracy and completeness, and I agree with the above.  Willeen Niece, MD

## 2022-09-16 ENCOUNTER — Telehealth: Payer: Self-pay | Admitting: Family Medicine

## 2022-09-16 DIAGNOSIS — Z794 Long term (current) use of insulin: Secondary | ICD-10-CM

## 2022-09-16 MED ORDER — FREESTYLE LIBRE 2 SENSOR MISC: 6 refills | Status: DC

## 2022-09-16 NOTE — Telephone Encounter (Signed)
Prescription Request  09/16/2022  LOV: Visit date not found  What is the name of the medication or equipment?  Continuous Blood Gluc Sensor (FREESTYLE LIBRE 2 SENSOR) MISC   Have you contacted your pharmacy to request a refill? No   Which pharmacy would you like this sent to?    [id number] [place], [place] - [address] AT [facility] OF [address] [place] [address] Phone: [phone] Fax: [phone]    Patient notified that their request is being sent to the clinical staff for review and that they should receive a response within 2 business days.   Please advise at Mobile [phone] (mobile)

## 2022-09-16 NOTE — Telephone Encounter (Signed)
E-scribed refill  Plz schedule CPE and fasting lab (no food/drink- except water and/or blk coffee 5 hrs prior) visits for additional refills.  

## 2022-09-16 NOTE — Telephone Encounter (Signed)
Lvmtcb, sent mychart message  

## 2022-09-18 MED ORDER — FREESTYLE LIBRE 2 SENSOR MISC
6 refills | Status: DC
Start: 2022-09-18 — End: 2022-09-22

## 2022-09-18 NOTE — Telephone Encounter (Signed)
[facility] #[id number] [place] [address], [place] - [address] AT [facility] [address] Phone: [phone]  Fax: [phone]     [facility] will not [facility] the freestyle libre sensors

## 2022-09-18 NOTE — Telephone Encounter (Signed)
ERx sensors to local pharmacy

## 2022-09-18 NOTE — Addendum Note (Signed)
Addended by: Eustaquio Boyden on: 09/18/2022 01:49 PM   Modules accepted: Orders

## 2022-09-22 ENCOUNTER — Telehealth: Payer: Self-pay | Admitting: Family Medicine

## 2022-09-22 DIAGNOSIS — Z794 Long term (current) use of insulin: Secondary | ICD-10-CM

## 2022-09-22 MED ORDER — FREESTYLE LIBRE 2 SENSOR MISC
1 refills | Status: DC
Start: 2022-09-22 — End: 2023-01-13

## 2022-09-22 NOTE — Telephone Encounter (Signed)
Patient's wife contacted the office regarding patient's medication freestyle libre. Says they [facility] and [facility] will [facility] the [facility]. Asked if all future refills [facility] be [facility]

## 2022-09-22 NOTE — Telephone Encounter (Signed)
I've sent in freestyle libre sensors to mail order pharmacy

## 2022-09-23 NOTE — Telephone Encounter (Signed)
Spoke with pt relaying Dr. G's message.  Pt verbalizes understanding and expresses his thanks.  

## 2022-12-02 ENCOUNTER — Other Ambulatory Visit: Payer: Self-pay | Admitting: Family Medicine

## 2022-12-02 DIAGNOSIS — E1169 Type 2 diabetes mellitus with other specified complication: Secondary | ICD-10-CM

## 2023-01-13 ENCOUNTER — Ambulatory Visit: Payer: Medicare Other | Admitting: Family Medicine

## 2023-01-13 ENCOUNTER — Encounter: Payer: Self-pay | Admitting: Family Medicine

## 2023-01-13 VITALS — BP 160/80 | HR 75 | Temp 98.0°F | Ht 66.5 in | Wt 188.2 lb

## 2023-01-13 DIAGNOSIS — E1169 Type 2 diabetes mellitus with other specified complication: Secondary | ICD-10-CM | POA: Diagnosis not present

## 2023-01-13 DIAGNOSIS — E785 Hyperlipidemia, unspecified: Secondary | ICD-10-CM

## 2023-01-13 DIAGNOSIS — I10 Essential (primary) hypertension: Secondary | ICD-10-CM

## 2023-01-13 DIAGNOSIS — Z794 Long term (current) use of insulin: Secondary | ICD-10-CM

## 2023-01-13 DIAGNOSIS — G459 Transient cerebral ischemic attack, unspecified: Secondary | ICD-10-CM | POA: Diagnosis not present

## 2023-01-13 DIAGNOSIS — Z7189 Other specified counseling: Secondary | ICD-10-CM | POA: Diagnosis not present

## 2023-01-13 DIAGNOSIS — G4733 Obstructive sleep apnea (adult) (pediatric): Secondary | ICD-10-CM | POA: Diagnosis not present

## 2023-01-13 DIAGNOSIS — I2510 Atherosclerotic heart disease of native coronary artery without angina pectoris: Secondary | ICD-10-CM

## 2023-01-13 DIAGNOSIS — Z77098 Contact with and (suspected) exposure to other hazardous, chiefly nonmedicinal, chemicals: Secondary | ICD-10-CM | POA: Diagnosis not present

## 2023-01-13 MED ORDER — FREESTYLE LIBRE 3 PLUS SENSOR MISC: 3 refills | Status: DC

## 2023-01-13 NOTE — Assessment & Plan Note (Signed)
Advanced directive planning - has living will at home, is organ donor. Wife is HCPOA. Planning to update soon with attorney 02/2023. Asked to bring [place] copy when complete

## 2023-01-13 NOTE — Assessment & Plan Note (Signed)
Previously didn't tolerate CPAP. Subsequent weight loss, denies OSA symptoms.

## 2023-01-13 NOTE — Patient Instructions (Addendum)
BP too high today - may have been ramen.  Let us know if home BP readings running >140/90.  Work on low salt/sodium diet - goal <2 grams (2,000mg ) per day. Eat a diet high in fruits/vegetables and whole grains.  Look into mediterranean and DASH diet. Goal activity is 127min/wk of moderate intensity exercise.  This can be split into 30 minute chunks.  If you are not at this level, you can start with smaller 10-15 min increments and slowly build up activity. Look at www.heart.org for more resources  Touch base with cardiology for follow up visit.  Good to see you today Return as needed or in 1 year for next follow up visit, but sooner if BP staying high.

## 2023-01-13 NOTE — Progress Notes (Signed)
Ph: [phone] Fax: [phone]   Patient ID: William Walton, male    DOB: 25-Mar-1940, 82 y.o.   MRN: [id number]  This visit was conducted in person.  BP (!) 160/80 (BP Location: Right Arm, Cuff Size: Normal)   Pulse 75   Temp 98 F (36.7 C) (Oral)   Ht 5' 6.5" (1.689 m)   Wt 188 lb 4 oz (85.4 kg)   SpO2 97%   BMI 29.93 kg/m   BP Readings from Last 3 Encounters:  01/13/23 (!) 160/80  08/31/22 (!) 142/72  07/29/21 134/72   CC: AMW f/u visit  Subjective:   HPI: William Walton is a 82 y.o. male presenting on 01/13/2023 for Annual Exam [facility] prt 2 [AWV- 06/01/22]. Needs new rx for FreeStyle Libre 3+, since model 2 was discontinued. Wants it sent to [facility]. )   Saw health advisor 05/2022 for medicare wellness visit. Note reviewed.  Last saw [facility] 06/2022. Next appt 02/25/2023.   No results found.  Flowsheet Row Clinical Support from 06/01/2022 in [facility] [place]  PHQ-2 Total Score 0          06/01/2022    9:09 AM 05/28/2021    2:49 PM 09/20/2017   10:39 AM 01/24/2016    4:38 PM 01/20/2016    9:39 AM  Fall Risk   Falls in the past year? 0 0 No Yes Yes  Comment   Emmi Telephone Survey: data to providers prior to load Emmi Telephone Survey: data to providers prior to load 2 accidentals falls secondary to hypotension   Number falls in past yr: 0 0  2 or more 2 or more  Comment    Emmi Telephone Survey Actual Response = 5   Injury with Fall? 0 0  Yes Yes  Risk for fall due to : No Fall Risks No Fall Risks     Follow up Falls prevention discussed;Falls evaluation completed Falls prevention discussed   Falls evaluation completed     CAD - h/o MI s/p 5v CABG. Saw Dr Mariah Milling 06/2021.   HTN - home reading last week 119/72. Checks intermittently at home.    OSA - h/o this by sleep test, trouble tolerating CPAP. Has lost weight since then.   DM - using freestyle libre 2 - needs Libre 3+ as 2 was discontinued. [facility]. [facility]4 [facility]  (10/2022).  Lab Results  Component Value Date   HGBA1C 7.6 07/03/2021    Diabetic Foot Exam - Simple   Simple Foot Form Diabetic Foot exam was performed with the following findings: Yes 01/13/2023 10:33 AM  Visual Inspection No deformities, no ulcerations, no other skin breakdown bilaterally: Yes Sensation Testing Intact to touch and monofilament testing bilaterally: Yes Pulse Check Posterior Tibialis and Dorsalis pulse intact bilaterally: Yes Comments No claudication    Preventative: Colon cancer screening - remote normal colonoscopy. Stool kits through the [place]. No blood in stool or bowel changes  Prostate cancer screening - normal at [facility] in the past. No fmhx prostate cancer.  Lung cancer screening - not eligible  Flu shot - declines  Td 08/2010, Tdap 12/2017  Pneumovax 2013, prevnar-13 01/2015 Td 2012 zostavax 02/2014, 02/2015 Shingrix - declines  Advanced directive planning - has living will at home, is organ donor. Wife is HCPOA. Planning to update soon with attorney 02/2023. Asked to bring [place] copy when complete  Seat belt use discussed Sunscreen use discussed. No changing moles  on skin. Sees derm q6 mo Ex smoker - quit 1968.  Alcohol - none Dentist through [facility] - planned full dentures Eye exam - yearly Bowel - no constipation Bladder - no incontinence  Caffeine: 3-4 cups caffeine free coffee, unsweet tea, diet caffeine free soda Lives with wife, daughter, son in Social worker and granddaughter, 1 dog Bichon Retired Hotel manager (Scientist, research (life sciences) forces) Activity: membership at [employer], previously went 3d/wk (lifting) Diet: fruits and vegetables, meat and potatoes, water      Relevant past medical, surgical, family and social history reviewed and updated as indicated. Interim medical history since our last visit reviewed. Allergies and medications reviewed and updated. Outpatient Medications Prior to Visit  Medication Sig Dispense Refill   aspirin 81 MG EC tablet Take  1 tablet (81 mg total) by mouth daily. Swallow whole.     Insulin Pen Needle (EASY TOUCH PEN NEEDLES) 31G X 6 MM MISC USE TO INJECT INSULIN TWICE A DAY 200 each 3   Continuous Blood Gluc Receiver (FREESTYLE LIBRE 2 READER) DEVI 1 Device by Does not apply route as directed. E11.69 1 each 0   Continuous Glucose Sensor (FREESTYLE LIBRE 2 SENSOR) MISC Use as instructed to check blood sugar 6 each 1   insulin aspart protamine - aspart (NOVOLOG MIX 70/30 FLEXPEN) (70-30) 100 UNIT/ML FlexPen Inject 34 Units into the skin daily with breakfast AND 32 Units daily with supper. 60 mL 3   insulin aspart protamine - aspart (NOVOLOG MIX 70/30 FLEXPEN) (70-30) 100 UNIT/ML FlexPen Inject 36 Units into the skin daily with breakfast AND 33 Units daily with supper. through [place].     No facility-administered medications prior to visit.     Per HPI unless specifically indicated in ROS section below Review of Systems  Objective:  BP (!) 160/80 (BP Location: Right Arm, Cuff Size: Normal)   Pulse 75   Temp 98 F (36.7 C) (Oral)   Ht 5' 6.5" (1.689 m)   Wt 188 lb 4 oz (85.4 kg)   SpO2 97%   BMI 29.93 kg/m   Wt Readings from Last 3 Encounters:  01/13/23 188 lb 4 oz (85.4 kg)  06/01/22 172 lb (78 kg)  07/29/21 172 lb 8 oz (78.2 kg)      Physical Exam Vitals and nursing note reviewed.  Constitutional:      General: He is not in acute distress.    Appearance: Normal appearance. He is well-developed. He is not ill-appearing.  HENT:     Head: Normocephalic and atraumatic.     Right Ear: Hearing, tympanic membrane, ear canal and external ear normal.     Left Ear: Hearing, tympanic membrane, ear canal and external ear normal.     Mouth/Throat:     Mouth: Mucous membranes are moist.     Pharynx: Oropharynx is clear. No oropharyngeal exudate or posterior oropharyngeal erythema.  Eyes:     General: No scleral icterus.    Extraocular Movements: Extraocular movements intact.     Conjunctiva/sclera: Conjunctivae  normal.     Pupils: Pupils are equal, round, and reactive to light.  Neck:     Thyroid: No thyroid mass or thyromegaly.     Vascular: No carotid bruit.  Cardiovascular:     Rate and Rhythm: Normal rate and regular rhythm.     Pulses: Normal pulses.          Radial pulses are 2+ on the right side and 2+ on the left side.  Heart sounds: Normal heart sounds. No murmur heard. Pulmonary:     Effort: Pulmonary effort is normal. No respiratory distress.     Breath sounds: Normal breath sounds. No wheezing, rhonchi or rales.  Musculoskeletal:        General: Normal range of motion.     Cervical back: Normal range of motion and neck supple.     Right lower leg: No edema.     Left lower leg: No edema.  Lymphadenopathy:     Cervical: No cervical adenopathy.  Skin:    General: Skin is warm and dry.     Findings: No rash.  Neurological:     General: No focal deficit present.     Mental Status: He is alert and oriented to person, place, and time.  Psychiatric:        Mood and Affect: Mood normal.        Behavior: Behavior normal.        Thought Content: Thought content normal.        Judgment: Judgment normal.       Results for orders placed or performed in visit on 05/01/22  HM DIABETES EYE EXAM  Result Value Ref Range   HM Diabetic Eye Exam No Retinopathy No Retinopathy    Assessment & Plan:   Problem List Items Addressed This Visit     Advanced care planning/counseling discussion - Primary (Chronic)    Advanced directive planning - has living will at home, is organ donor. Wife is HCPOA. Planning to update soon with attorney 02/2023. Asked to bring [place] copy when complete       CAD (coronary artery disease)    H/o MI s/p 5v CABG.  Has not seen cardiology recently - will call to schedule appt.       HTN (hypertension)    Chronic, persistently elevated today, however notes home readings well controlled. He has had more salt in diet (bbq and ramen).  He will start regularly  monitoring at home and let us know if consistently >140/90 for medication.  BP log sheet provided.       Hyperlipidemia associated with type 2 diabetes mellitus (HCC)    Has declined statin. Known CAD s/p CABG. Rec f/u with cards to consider Leqvio shots.  The ASCVD Risk score (Arnett DK, et al., 2019) failed to calculate for the following reasons:   The 2019 ASCVD risk score is only valid for ages 85 to 25       Relevant Medications   insulin aspart protamine - aspart (NOVOLOG MIX 70/30 FLEXPEN) (70-30) 100 UNIT/ML FlexPen   Type 2 diabetes mellitus with other specified complication (HCC)    Chronic, stable on current regimen of novolog 70/30 BID.  This is followed by [facility] .  Freestyle Libre 3 plus CGM Rx sent to pharmacy per pt request      Relevant Medications   insulin aspart protamine - aspart (NOVOLOG MIX 70/30 FLEXPEN) (70-30) 100 UNIT/ML FlexPen   TIA (transient ischemic attack)    Continue aspirin 81mg  daily. Has declined statin.       History of agent Orange exposure   Obstructive sleep apnea of adult    Previously didn't tolerate CPAP. Subsequent weight loss, denies OSA symptoms.         Meds ordered this encounter  Medications   Continuous Glucose Sensor (FREESTYLE LIBRE 3 PLUS SENSOR) MISC    Sig: E11.69 on daily insulin. Change sensor every 15 days.    Dispense:  6 each    Refill:  3    No orders of the defined types were placed in this encounter.   Patient Instructions  BP too high today - may have been ramen.  Let us know if home BP readings running >140/90.  Work on low salt/sodium diet - goal <2 grams (2,000mg ) per day. Eat a diet high in fruits/vegetables and whole grains.  Look into mediterranean and DASH diet. Goal activity is 172min/wk of moderate intensity exercise.  This can be split into 30 minute chunks.  If you are not at this level, you can start with smaller 10-15 min increments and slowly build up activity. Look at www.heart.org for more  resources  Touch base with cardiology for follow up visit.  Good to see you today Return as needed or in 1 year for next follow up visit, but sooner if BP staying high.   Follow up plan: Return in about 1 year (around 01/13/2024) for medicare wellness visit.  Eustaquio Boyden, MD

## 2023-01-13 NOTE — Assessment & Plan Note (Signed)
Continue aspirin 81mg  daily. Has declined statin.

## 2023-01-13 NOTE — Assessment & Plan Note (Addendum)
H/o MI s/p 5v CABG.  Has not seen cardiology recently - will call to schedule appt.

## 2023-01-13 NOTE — Assessment & Plan Note (Signed)
Has declined statin. Known CAD s/p CABG. Rec f/u with cards to consider Leqvio shots.  The ASCVD Risk score (Arnett DK, et al., 2019) failed to calculate for the following reasons:   The 2019 ASCVD risk score is only valid for ages 60 to 68

## 2023-01-13 NOTE — Assessment & Plan Note (Addendum)
Chronic, persistently elevated today, however notes home readings well controlled. He has had more salt in diet (bbq and ramen).  He will start regularly monitoring at home and let us know if consistently >140/90 for medication.  BP log sheet provided.

## 2023-01-13 NOTE — Assessment & Plan Note (Addendum)
Chronic, stable on current regimen of novolog 70/30 BID.  This is followed by [facility] .  Freestyle Libre 3 plus CGM Rx sent to pharmacy per pt request

## 2023-01-28 ENCOUNTER — Ambulatory Visit
Admission: EM | Admit: 2023-01-28 | Discharge: 2023-01-28 | Disposition: A | Discharge status: Home / Self Care | Payer: Medicare Other | Attending: Emergency Medicine | Admitting: Emergency Medicine

## 2023-01-28 VITALS — BP 152/85 | HR 82 | Temp 98.1°F | Resp 18

## 2023-01-28 DIAGNOSIS — J988 Other specified respiratory disorders: Secondary | ICD-10-CM

## 2023-01-28 DIAGNOSIS — B9789 Other viral agents as the cause of diseases classified elsewhere: Secondary | ICD-10-CM

## 2023-01-28 MED ORDER — IPRATROPIUM BROMIDE 0.06 % NA SOLN: 2.0000 | Freq: Three times a day (TID) | NASAL | 1 refills | Status: DC

## 2023-01-28 NOTE — ED Provider Notes (Signed)
William Walton    CSN: [id number] Arrival date & time: 01/28/23  1254    HISTORY   Chief Complaint  Patient presents with   Cough   HPI William Walton is a pleasant, 82 y.o. male who presents to urgent care today. Patient complains of a 6-day history of clear rhinorrhea, nasal congestion, sneezing, sore throat.  Patient states he is feeling a little bit better today but decided he should be "checked out".  States his most aggravating symptom at this time is runny nose.  Patient states he has not tried any medications or home remedies to alleviate his symptoms.  Patient has elevated blood pressure but otherwise normal vital signs on arrival today.  Patient denies headache, shortness of breath, fever, body aches, chills, nausea, vomiting, diarrhea, known sick contacts.  The history is provided by the patient.   Past Medical History:  Diagnosis Date   Actinic keratosis    face   Arthritis    CAD (coronary artery disease) 08/2010   STEMI 08/2010 s/p 5v CABG at [facility], cardiac rehab - only did 7/26 sessions   Controlled type 2 diabetes mellitus with circulatory disorder (HCC) 2012   [facility]   ED (erectile dysfunction)    likely medication induced   HLD (hyperlipidemia)    HTN (hypertension)    Hypertension    Mild carotid artery disease (HCC) 10/19/2015   Mild plaque build-up by [place] 10/2015, monitor clinically    Pneumonia due to COVID-19 virus 02/25/2021   Spondyloarthropathy 05/2014   by Xray   Patient Active Problem List   Diagnosis Date Noted   Obstructive sleep apnea of adult 05/27/2021   PTSD (post-traumatic stress disorder) 04/12/2021   History of agent [place] exposure 04/12/2021   Peripheral neuropathy 04/30/2017   Advanced care planning/counseling discussion 02/06/2016   Mild carotid artery disease (HCC) 10/19/2015   TIA (transient ischemic attack) 10/02/2015   Primary osteoarthritis of both knees 05/18/2014   Spondyloarthropathy 05/18/2014   Low back pain  05/10/2014   Type 2 diabetes mellitus with other specified complication (HCC) 12/01/2013   S/P total knee replacement 12/12/2012   Medicare annual wellness visit, initial 10/21/2010   ED (erectile dysfunction) 10/21/2010   HTN (hypertension)    Hyperlipidemia associated with type 2 diabetes mellitus (HCC)    CAD (coronary artery disease)    Past Surgical History:  Procedure Laterality Date   CATARACT EXTRACTION  11/2010   CORONARY ARTERY BYPASS GRAFT  08/2010   5v, [facility]. Cheri Kearns   ESOPHAGOGASTRODUODENOSCOPY  08/2010   normal esophagus, localized gastric erythema   TOTAL KNEE ARTHROPLASTY Left 08/2012   [facility] at [place]   US ECHOCARDIOGRAPHY  08/2010   mild LV dysfxn (EF 45%) with severe LVH, nl R vent sys fxn    Home Medications    Prior to Admission medications   Medication Sig Start Date End Date Taking? Authorizing Provider  aspirin 81 MG EC tablet Take 1 tablet (81 mg total) by mouth daily. Swallow whole. 04/11/21   Eustaquio Boyden, MD  Continuous Glucose Sensor (FREESTYLE LIBRE 3 PLUS SENSOR) MISC E11.69 on daily insulin. Change sensor every 15 days. 01/13/23   Eustaquio Boyden, MD  insulin aspart protamine - aspart (NOVOLOG MIX 70/30 FLEXPEN) (70-30) 100 UNIT/ML FlexPen Inject 36 Units into the skin daily with breakfast AND 33 Units daily with supper. through [place]. 01/13/23   Eustaquio Boyden, MD  Insulin Pen Needle (EASY TOUCH PEN NEEDLES) 31G X 6 MM MISC USE TO INJECT INSULIN  TWICE A DAY 12/04/22   Eustaquio Boyden, MD    Family History Family History  Problem Relation Age of Onset   Cancer Father 21       died of bladder CA   Coronary artery disease Brother 61       bypass   Hyperlipidemia Brother    Hypertension Brother    Coronary artery disease Maternal Uncle 72       MI   Diabetes Neg Hx    Stroke Neg Hx    Social History Social History   Tobacco Use   Smoking status: Former    Current packs/day: 0.00    Average packs/day: 1 pack/day for 5.0 years (5.0  ttl pk-yrs)    Types: Cigarettes    Start date: 02/16/1961    Quit date: 02/16/1966    Years since quitting: 56.9   Smokeless tobacco: Never  Vaping Use   Vaping status: Never Used  Substance Use Topics   Alcohol use: No   Drug use: No   Allergies   Patient has no known allergies.  Review of Systems Review of Systems Pertinent findings revealed after performing a 14 point review of systems has been noted in the history of present illness.  Physical Exam Vital Signs BP (!) 152/85 (BP Location: Left Arm)   Pulse 82   Temp 98.1 F (36.7 C) (Oral)   Resp 18   SpO2 98%   No data found.  Physical Exam Vitals and nursing note reviewed.  Constitutional:      General: He is not in acute distress.    Appearance: Normal appearance. He is not ill-appearing.  HENT:     Head: Normocephalic and atraumatic.     Salivary Glands: Right salivary gland is not diffusely enlarged or tender. Left salivary gland is not diffusely enlarged or tender.     Right Ear: Tympanic membrane, ear canal and external ear normal. No drainage. No middle ear effusion. There is no impacted cerumen. Tympanic membrane is not erythematous or bulging.     Left Ear: Tympanic membrane, ear canal and external ear normal. No drainage.  No middle ear effusion. There is no impacted cerumen. Tympanic membrane is not erythematous or bulging.     Nose: Congestion and rhinorrhea present. No nasal deformity, septal deviation or mucosal edema. Rhinorrhea is clear.     Right Turbinates: Not enlarged, swollen or pale.     Left Turbinates: Not enlarged, swollen or pale.     Right Sinus: No maxillary sinus tenderness or frontal sinus tenderness.     Left Sinus: No maxillary sinus tenderness or frontal sinus tenderness.     Mouth/Throat:     Lips: Pink. No lesions.     Mouth: Mucous membranes are moist. No oral lesions.     Pharynx: Oropharynx is clear. Uvula midline. Posterior oropharyngeal erythema present. No pharyngeal swelling,  oropharyngeal exudate, uvula swelling or postnasal drip.     Tonsils: No tonsillar exudate. 0 on the right. 0 on the left.  Eyes:     General: Lids are normal.        Right eye: No discharge.        Left eye: No discharge.     Extraocular Movements: Extraocular movements intact.     Conjunctiva/sclera: Conjunctivae normal.     Right eye: Right conjunctiva is not injected.     Left eye: Left conjunctiva is not injected.  Neck:     Trachea: Trachea and phonation normal.  Cardiovascular:  Rate and Rhythm: Normal rate and regular rhythm.     Pulses: Normal pulses.     Heart sounds: Normal heart sounds. No murmur heard.    No friction rub. No gallop.  Pulmonary:     Effort: Pulmonary effort is normal. No accessory muscle usage, prolonged expiration or respiratory distress.     Breath sounds: Normal breath sounds. No stridor, decreased air movement or transmitted upper airway sounds. No decreased breath sounds, wheezing, rhonchi or rales.  Chest:     Chest wall: No tenderness.  Musculoskeletal:        General: Normal range of motion.     Cervical back: Normal range of motion and neck supple. Normal range of motion.  Lymphadenopathy:     Cervical: No cervical adenopathy.  Skin:    General: Skin is warm and dry.     Findings: No erythema or rash.  Neurological:     General: No focal deficit present.     Mental Status: He is alert and oriented to person, place, and time.  Psychiatric:        Mood and Affect: Mood normal.        Behavior: Behavior normal.     Visual Acuity Right Eye Distance:   Left Eye Distance:   Bilateral Distance:    Right Eye Near:   Left Eye Near:    Bilateral Near:     UC Couse / Diagnostics / Procedures:     Radiology No results found.  Procedures Procedures (including critical care time) EKG  Pending results:  Labs Reviewed - No data to display  Medications Ordered in UC: Medications - No data to display  UC Diagnoses / Final Clinical  Impressions(s)   I have reviewed the triage vital signs and the nursing notes.  Pertinent labs & imaging results that were available during my care of the patient were reviewed by me and considered in my medical decision making (see chart for details).    Final diagnoses:  Viral respiratory illness   Patient advised physical exam findings are concerning for viral respiratory illness without complication.  Viral testing not indicated due to duration of symptoms and patient feeling better overall.  Recommend Atrovent nasal spray to relieve rhinorrhea and nasal congestion.  Conservative care recommended.  Return precautions advised.  Please see discharge instructions below for details of plan of care as provided to patient. ED Prescriptions     Medication Sig Dispense Auth. Provider   ipratropium (ATROVENT) 0.06 % nasal spray Place 2 sprays into both nostrils 3 (three) times daily. As needed for nasal congestion, runny nose 15 mL Theadora Rama Scales, PA-C      PDMP not reviewed this encounter.  Pending results:  Labs Reviewed - No data to display    Discharge Instructions      Your symptoms and physical exam findings are concerning for a viral respiratory infection.  Because respiratory allergies are not well controlled at this time, this makes you more susceptible to catching respiratory infections.   Please read below to learn more about the medications, dosages and frequencies that I recommend to help alleviate your symptoms and to get you feeling better soon:   Atrovent (ipratropium): This is an excellent nasal decongestant spray that does not cause rebound congestion.  Please instill 2 sprays into each nare with each use, you may use this up to 3 times daily.  Once you find that you are forgetting to use the spray more often that you remember  to use it, you will know that you no longer need it.   If symptoms have not meaningfully improved in the next 5 to 7 days, please return  for repeat evaluation or follow-up with your regular provider.  If symptoms have worsened in the next 3 to 5 days, please return for repeat evaluation or follow-up with your regular provider.    Thank you for visiting urgent care today.  We appreciate the opportunity to participate in your care.       Disposition Upon Discharge:  Condition: stable for discharge home  Patient presented with an acute illness with associated systemic symptoms and significant discomfort requiring urgent management. In my opinion, this is a condition that a prudent lay person (someone who possesses an average knowledge of health and medicine) may potentially expect to result in complications if not addressed urgently such as respiratory distress, impairment of bodily function or dysfunction of bodily organs.   Routine symptom specific, illness specific and/or disease specific instructions were discussed with the patient and/or caregiver at length.   As such, the patient has been evaluated and assessed, work-up was performed and treatment was provided in alignment with urgent care protocols and evidence based medicine.  Patient/parent/caregiver has been advised that the patient may require follow up for further testing and treatment if the symptoms continue in spite of treatment, as clinically indicated and appropriate.  Patient/parent/caregiver has been advised to return to the [facility] or PCP if no better; to PCP or the Emergency Department if new signs and symptoms develop, or if the current signs or symptoms continue to change or worsen for further workup, evaluation and treatment as clinically indicated and appropriate  The patient will follow up with their current PCP if and as advised. If the patient does not currently have a PCP we will assist them in obtaining one.   The patient may need specialty follow up if the symptoms continue, in spite of conservative treatment and management, for further workup,  evaluation, consultation and treatment as clinically indicated and appropriate.  Patient/parent/caregiver verbalized understanding and agreement of plan as discussed.  All questions were addressed during visit.  Please see discharge instructions below for further details of plan.  This office note has been dictated using Teaching laboratory technician.  Unfortunately, this method of dictation can sometimes lead to typographical or grammatical errors.  I apologize for your inconvenience in advance if this occurs.  Please do not hesitate to reach out to me if clarification is needed.      Theadora Rama Scales, [place] 01/28/23 [phone]

## 2023-01-28 NOTE — ED Triage Notes (Signed)
Cough, runny nose, sinus pressure x 6 days. Not taking any OTC medication at this time.

## 2023-01-28 NOTE — Discharge Instructions (Signed)
Your symptoms and physical exam findings are concerning for a viral respiratory infection.  Because respiratory allergies are not well controlled at this time, this makes you more susceptible to catching respiratory infections.   Please read below to learn more about the medications, dosages and frequencies that I recommend to help alleviate your symptoms and to get you feeling better soon:   Atrovent (ipratropium): This is an excellent nasal decongestant spray that does not cause rebound congestion.  Please instill 2 sprays into each nare with each use, you may use this up to 3 times daily.  Once you find that you are forgetting to use the spray more often that you remember to use it, you will know that you no longer need it.   If symptoms have not meaningfully improved in the next 5 to 7 days, please return for repeat evaluation or follow-up with your regular provider.  If symptoms have worsened in the next 3 to 5 days, please return for repeat evaluation or follow-up with your regular provider.    Thank you for visiting urgent care today.  We appreciate the opportunity to participate in your care.

## 2023-03-03 ENCOUNTER — Ambulatory Visit: Payer: Medicare Other | Admitting: Dermatology

## 2023-03-18 ENCOUNTER — Ambulatory Visit: Payer: Medicare Other | Admitting: Dermatology

## 2023-03-25 ENCOUNTER — Ambulatory Visit: Payer: Medicare Other | Admitting: Dermatology

## 2023-04-09 ENCOUNTER — Telehealth: Payer: Self-pay | Admitting: Cardiovascular Disease

## 2023-04-09 NOTE — Telephone Encounter (Signed)
 Pt cx appt with APP due to not wanting to see her but instead requesting cb to discuss some experimental injection that he was being referred to????

## 2023-04-09 NOTE — Telephone Encounter (Signed)
 Call placed back to the patient. He was calling to speak with his doctor but was confused between Dr. Mariah Milling and his PCP. He has been advised that he has not seen Dr. Mariah Milling in 2 years. He stated that he meant to call his PCP.

## 2023-05-04 ENCOUNTER — Ambulatory Visit: Payer: Medicare Other | Admitting: Student

## 2023-06-15 ENCOUNTER — Ambulatory Visit: Payer: Medicare Other | Admitting: Cardiovascular Disease

## 2023-06-18 ENCOUNTER — Encounter: Payer: Self-pay | Admitting: Cardiovascular Disease

## 2023-06-18 ENCOUNTER — Ambulatory Visit: Attending: Cardiovascular Disease | Admitting: Cardiovascular Disease

## 2023-06-18 VITALS — BP 150/80 | HR 76 | Ht 68.0 in | Wt 191.0 lb

## 2023-06-18 DIAGNOSIS — I739 Peripheral vascular disease, unspecified: Secondary | ICD-10-CM | POA: Diagnosis not present

## 2023-06-18 DIAGNOSIS — Z951 Presence of aortocoronary bypass graft: Secondary | ICD-10-CM | POA: Diagnosis not present

## 2023-06-18 DIAGNOSIS — T466X5D Adverse effect of antihyperlipidemic and antiarteriosclerotic drugs, subsequent encounter: Secondary | ICD-10-CM | POA: Diagnosis not present

## 2023-06-18 DIAGNOSIS — I700 Atherosclerosis of aorta: Secondary | ICD-10-CM | POA: Diagnosis not present

## 2023-06-18 DIAGNOSIS — M791 Myalgia, unspecified site: Secondary | ICD-10-CM | POA: Insufficient documentation

## 2023-06-18 DIAGNOSIS — I10 Essential (primary) hypertension: Secondary | ICD-10-CM | POA: Diagnosis not present

## 2023-06-18 DIAGNOSIS — I25118 Atherosclerotic heart disease of native coronary artery with other forms of angina pectoris: Secondary | ICD-10-CM | POA: Diagnosis not present

## 2023-06-18 DIAGNOSIS — T466X5A Adverse effect of antihyperlipidemic and antiarteriosclerotic drugs, initial encounter: Secondary | ICD-10-CM | POA: Insufficient documentation

## 2023-06-18 DIAGNOSIS — E1169 Type 2 diabetes mellitus with other specified complication: Secondary | ICD-10-CM | POA: Diagnosis not present

## 2023-06-18 DIAGNOSIS — Z794 Long term (current) use of insulin: Secondary | ICD-10-CM | POA: Insufficient documentation

## 2023-06-18 MED ORDER — REPATHA SURECLICK 140 MG/ML ~~LOC~~ SOAJ: 140.0000 mg | SUBCUTANEOUS | 6 refills | Status: DC

## 2023-06-18 NOTE — Patient Instructions (Addendum)
 Medication Instructions:  Repatha 140 sq every 2 weeks  If you need a refill on your cardiac medications before your next appointment, please call your pharmacy.   Lab work: A1C and lipid panel today Lipids in 3 months (Fasting please; no appointment needed, closed from 1pm-2pm; arrive by 4:00pm)  Testing/Procedures: No new testing needed  Follow-Up: At [facility], you and your health needs are our priority.  As part of our continuing mission to provide you with exceptional heart care, we have created designated Provider Care Teams.  These Care Teams include your primary Cardiologist (physician) and Advanced Practice Providers (APPs -  Physician Assistants and Nurse Practitioners) who all work together to provide you with the care you need, when you need it.  You will need a follow up appointment in 12 months  Providers on your designated Care Team:   Laneta Pintos, NP Varney Gentleman, PA-C Cadence Gennaro Khat, [place]  COVID-19 Vaccine Information can be found at: [email or web address] For questions related to vaccine distribution or appointments, please email [email or web address] or call [phone].

## 2023-06-18 NOTE — Progress Notes (Signed)
 Cardiology Office Note  Date:  06/18/2023   ID:  William Walton April 05, 1940, MRN [id number]  PCP:  William Crick, MD   Chief Complaint  Patient presents with   Follow-up    Patient c/o shortness of breath with racing heart beats at times.     HPI:  William Walton is a 83 year old gentleman with past medical history of Former smoker Type 2 diabetes COVID January 2023 PAD/aortic atherosclerosis Hyperlipidemia, statin myalgia CAD, hx of CABG x5 in 2012 at [facility] orange exposure/ PTSD followed at the [facility] Who presents for routine follow-up of his coronary disease and hyperlipidemia  Last seen by myself May 2023 Long history of preferring minimal medication  Checks sugars at home, 121 in the office today,  90 day avg: 125 Has a measuring device On insulin : 80 units a day split up 36 u in Am, 32 in at bedtime  BP at home: 117/68 at home Higher numbers today, feels this is not his normal  [place], tries to stay active Can get heart rate up to 130-140, gets tired, sits down and breathing recovers Denies significant chest pain on exertion  Labs 5/23 Total chol 244, LDL 179 A1c 8.6  EKG personally reviewed by myself on todays visit EKG Interpretation Date/Time:  Friday Jun 18 2023 10:31:21 EDT Ventricular Rate:  76 PR Interval:  162 QRS Duration:  88 QT Interval:  390 QTC Calculation: 438 R Axis:   66  Text Interpretation: Sinus rhythm with occasional Premature ventricular complexes When compared with ECG of 19-Feb-2021 11:59, Premature ventricular complexes are now Present T wave inversion no longer evident in Inferior leads T wave amplitude has decreased in Lateral leads Confirmed by William Walton [phone]) on 06/18/2023 10:52:22 AM   Other past medical history reviewed Reports that he was placed on too many medications Lack of energy several years ago, attributed this to his medications Stopped most of his medications at that time A1C started to  climb, doctor convinced him to go back on insulin , Restarted insulin   CT scan January 2023 aortic atherosclerosis coronary calcification  EKG personally reviewed by myself on todays visit EKG Interpretation Date/Time:  Friday Jun 18 2023 10:31:21 EDT Ventricular Rate:  76 PR Interval:  162 QRS Duration:  88 QT Interval:  390 QTC Calculation: 438 R Axis:   66  Text Interpretation: Sinus rhythm with occasional Premature ventricular complexes When compared with ECG of 19-Feb-2021 11:59, Premature ventricular complexes are now Present T wave inversion no longer evident in Inferior leads T wave amplitude has decreased in Lateral leads Confirmed by William Walton [phone]) on 06/18/2023 10:52:22 AM    PMH:   has a past medical history of Actinic keratosis, Arthritis, CAD (coronary artery disease) (08/2010), Controlled type 2 diabetes mellitus with circulatory disorder (HCC) (2012), ED (erectile dysfunction), HLD (hyperlipidemia), HTN (hypertension), Hypertension, Mild carotid artery disease (HCC) (10/19/2015), Pneumonia due to COVID-19 virus (02/25/2021), and Spondyloarthropathy (05/2014).  PSH:    Past Surgical History:  Procedure Laterality Date   CATARACT EXTRACTION  11/2010   CORONARY ARTERY BYPASS GRAFT  08/2010   5v, [facility] Dr. Gonzella Latin   ESOPHAGOGASTRODUODENOSCOPY  08/2010   normal esophagus, localized gastric erythema   TOTAL KNEE ARTHROPLASTY Left 08/2012   [facility] at [place]   US  ECHOCARDIOGRAPHY  08/2010   mild LV dysfxn (EF 45%) with severe LVH, nl R vent sys fxn    Current Outpatient Medications  Medication Sig Dispense Refill   aspirin  81 MG  EC tablet Take 1 tablet (81 mg total) by mouth daily. Swallow whole.     insulin  aspart protamine - aspart (NOVOLOG  MIX 70/30 FLEXPEN) (70-30) 100 UNIT/ML FlexPen Inject 36 Units into the skin daily with breakfast AND 33 Units daily with supper. through [place].     Continuous Glucose Sensor (FREESTYLE LIBRE 3 PLUS SENSOR) MISC E11.69 on daily insulin . Change  sensor every 15 days. (Patient not taking: Reported on 06/18/2023) 6 each 3   Insulin  Pen Needle (EASY TOUCH PEN NEEDLES) 31G X 6 MM MISC USE TO INJECT INSULIN  TWICE A DAY (Patient not taking: Reported on 06/18/2023) 200 each 3   ipratropium (ATROVENT ) 0.06 % nasal spray Place 2 sprays into both nostrils 3 (three) times daily. As needed for nasal congestion, runny nose (Patient not taking: Reported on 06/18/2023) 15 mL 1   No current facility-administered medications for this visit.   Allergies:   Patient has no known allergies.   Social History:  The patient  reports that he quit smoking about 57 years ago. His smoking use included cigarettes. He started smoking about 62 years ago. He has a 5 pack-year smoking history. He has never used smokeless tobacco. He reports that he does not drink alcohol and does not use drugs.   Family History:   family history includes Cancer (age of onset: 26) in his father; Coronary artery disease (age of onset: 12) in his brother and maternal uncle; Hyperlipidemia in his brother; Hypertension in his brother.   Review of Systems: Review of Systems  Constitutional: Negative.   HENT: Negative.    Respiratory: Negative.    Cardiovascular: Negative.   Gastrointestinal: Negative.   Musculoskeletal: Negative.   Neurological: Negative.   Psychiatric/Behavioral: Negative.    All other systems reviewed and are negative.  PHYSICAL EXAM: VS:  BP (!) 150/80 (BP Location: Left Arm, Patient Position: Sitting, Cuff Size: Normal)   Pulse 76   Ht 5\' 8"  (1.727 m)   Wt 191 lb (86.6 kg)   SpO2 97%   BMI 29.04 kg/m  , BMI Body mass index is 29.04 kg/m. Constitutional:  oriented to person, place, and time. No distress.  HENT:  Head: Grossly normal Eyes:  no discharge. No scleral icterus.  Neck: No JVD, no carotid bruits  Cardiovascular: Regular rate and rhythm, no murmurs appreciated Pulmonary/Chest: Clear to auscultation bilaterally, no wheezes or rails Abdominal: Soft.   no distension.  no tenderness.  Musculoskeletal: Normal range of motion Neurological:  normal muscle tone. Coordination normal. No atrophy Skin: Skin warm and dry Psychiatric: normal affect, pleasant  Recent Labs: No results found for requested labs within last 365 days.   Lipid Panel Lab Results  Component Value Date   CHOL 244 (A) 07/03/2021   HDL 36 07/03/2021   LDLCALC 179 07/03/2021   TRIG 146 07/03/2021    Wt Readings from Last 3 Encounters:  06/18/23 191 lb (86.6 kg)  01/13/23 188 lb 4 oz (85.4 kg)  06/01/22 172 lb (78 kg)     ASSESSMENT AND PLAN:  Problem List Items Addressed This Visit       Cardiology Problems   CAD (coronary artery disease) - Primary   Relevant Orders   EKG 12-Lead (Completed)   HTN (hypertension)   Relevant Orders   EKG 12-Lead (Completed)   Other Visit Diagnoses       Aortic atherosclerosis (HCC)       Relevant Orders   EKG 12-Lead (Completed)     PAD (peripheral artery  disease) (HCC)       Relevant Orders   EKG 12-Lead (Completed)     Hx of CABG       Relevant Orders   EKG 12-Lead (Completed)       CABG X5 (08/29/2010) No intervention since CABG in 2012 Denies anginal symptoms On last clinic visit preferred no medication for cholesterol Today he is open to Repatha/Praluent Prescription sent in  Diabetes type 2 with complications [facility] , monitors sugars closely  Hyperlipidemia Willing to try PCSK9 inhibitor, prescription sent in for Repatha Lipid panel ordered today (baseline before medication) and again in 3 months  Essential hypertension Currently not on medications, feels his blood pressure is well controlled at home Elevated on last several office visits Recommend close monitoring   Signed, Juanda Noon, M.D., Ph.D. [facility] [place], [place] [phone]

## 2023-06-19 ENCOUNTER — Encounter: Payer: Self-pay | Admitting: Cardiovascular Disease

## 2023-06-19 LAB — LIPID PANEL
Chol/HDL Ratio: 6.7 ratio — ABNORMAL HIGH (ref 0.0–5.0)
Cholesterol, Total: 241 mg/dL — ABNORMAL HIGH (ref 100–199)
HDL: 36 mg/dL — ABNORMAL LOW (ref >39)
LDL Chol Calc (NIH): 179 mg/dL — ABNORMAL HIGH (ref 0–99)
Triglycerides: 139 mg/dL (ref 0–149)
VLDL Cholesterol Cal: 26 mg/dL (ref 5–40)

## 2023-06-19 LAB — HEMOGLOBIN A1C
Est. average glucose Bld gHb Est-mCnc: 171 mg/dL
Hgb A1c MFr Bld: 7.6 % — ABNORMAL HIGH (ref 4.8–5.6)

## 2023-07-22 DIAGNOSIS — E119 Type 2 diabetes mellitus without complications: Secondary | ICD-10-CM | POA: Diagnosis not present

## 2023-07-22 DIAGNOSIS — H5203 Hypermetropia, bilateral: Secondary | ICD-10-CM | POA: Diagnosis not present

## 2023-07-22 DIAGNOSIS — D3131 Benign neoplasm of right choroid: Secondary | ICD-10-CM | POA: Diagnosis not present

## 2023-07-22 DIAGNOSIS — H26491 Other secondary cataract, right eye: Secondary | ICD-10-CM | POA: Diagnosis not present

## 2023-07-22 DIAGNOSIS — H40013 Open angle with borderline findings, low risk, bilateral: Secondary | ICD-10-CM | POA: Diagnosis not present

## 2023-08-10 ENCOUNTER — Encounter

## 2023-09-09 ENCOUNTER — Emergency Department

## 2023-09-09 ENCOUNTER — Emergency Department
Admission: EM | Admit: 2023-09-09 | Discharge: 2023-09-09 | Disposition: A | Discharge status: Home / Self Care | Attending: Emergency Medicine | Admitting: Emergency Medicine

## 2023-09-09 DIAGNOSIS — I2510 Atherosclerotic heart disease of native coronary artery without angina pectoris: Secondary | ICD-10-CM | POA: Insufficient documentation

## 2023-09-09 DIAGNOSIS — I10 Essential (primary) hypertension: Secondary | ICD-10-CM | POA: Insufficient documentation

## 2023-09-09 DIAGNOSIS — R079 Chest pain, unspecified: Secondary | ICD-10-CM | POA: Insufficient documentation

## 2023-09-09 DIAGNOSIS — R0789 Other chest pain: Secondary | ICD-10-CM | POA: Diagnosis not present

## 2023-09-09 LAB — BASIC METABOLIC PANEL WITH GFR
Anion gap: 10 (ref 5–15)
BUN: 12 mg/dL (ref 8–23)
CO2: 25 mmol/L (ref 22–32)
Calcium: 8.9 mg/dL (ref 8.9–10.3)
Chloride: 102 mmol/L (ref 98–111)
Creatinine, Ser: 1.08 mg/dL (ref 0.61–1.24)
GFR, Estimated: >60 mL/min (ref >60)
Glucose, Bld: 270 mg/dL — ABNORMAL HIGH (ref 70–99)
Potassium: 4.2 mmol/L (ref 3.5–5.1)
Sodium: 137 mmol/L (ref 135–145)

## 2023-09-09 LAB — CBC
HCT: 42 % (ref 39.0–52.0)
Hemoglobin: 13.9 g/dL (ref 13.0–17.0)
MCH: 29.4 pg (ref 26.0–34.0)
MCHC: 33.1 g/dL (ref 30.0–36.0)
MCV: 89 fL (ref 80.0–100.0)
Platelets: 250 K/uL (ref 150–400)
RBC: 4.72 MIL/uL (ref 4.22–5.81)
RDW: 14.5 % (ref 11.5–15.5)
WBC: 6.6 K/uL (ref 4.0–10.5)
nRBC: 0 % (ref 0.0–0.2)

## 2023-09-09 LAB — TROPONIN I (HIGH SENSITIVITY)
Troponin I (High Sensitivity): 5 ng/L (ref <18)
Troponin I (High Sensitivity): 5 ng/L (ref <18)

## 2023-09-09 MED ORDER — SODIUM CHLORIDE 0.9 % IV BOLUS
1000.0000 mL | Freq: Once | INTRAVENOUS | Status: AC
  Administered 2023-09-09: 1000 mL via INTRAVENOUS

## 2023-09-09 NOTE — ED Triage Notes (Signed)
 Pt states L sided CP started yesterday, pt states 5 bypasses, pt denies N/V/ or SOB, pt does endorse dizziness.

## 2023-09-09 NOTE — Discharge Instructions (Signed)
 As we discussed please drink plenty of fluids over the next Avril days.  Please follow-up with your doctor for recheck/reevaluation.  Return to the emergency department for any return of/worsening chest pain lightheadedness or any other symptom personally concerning to yourself.

## 2023-09-09 NOTE — ED Provider Notes (Signed)
 [facility] Provider Note    Event Date/Time   First MD Initiated Contact with Patient 09/09/23 1211     (approximate)  History   Chief Complaint: Chest Pain  HPI  William Walton is a 83 y.o. male with a past medical history of arthritis, CAD, bypass, MI, hypertension, hyperlipidemia, presents to the emergency department for slight chest discomfort as well as some lightheadedness.  According to the patient for the last few days he has been getting lightheaded especially upon awakening in the morning when he first gets up.  He states yesterday he noted some chest discomfort as well.  No chest discomfort today but upon awakening this morning getting out of bed he once again felt lightheaded briefly.  Patient states he has been eating and drinking slightly less over the last 2 weeks, states he does not know why but he has been skipping breakfast.  Patient denies any pain currently.  Denies any nausea or diaphoresis at any point.  Physical Exam   Triage Vital Signs: ED Triage Vitals  Encounter Vitals Group     BP 09/09/23 0958 (!) 158/79     Girls Systolic BP Percentile --      Girls Diastolic BP Percentile --      Boys Systolic BP Percentile --      Boys Diastolic BP Percentile --      Pulse Rate 09/09/23 0958 89     Resp 09/09/23 0958 19     Temp 09/09/23 0958 98.2 F (36.8 C)     Temp Source 09/09/23 0958 Oral     SpO2 09/09/23 0958 98 %     Weight 09/09/23 0959 183 lb (83 kg)     Height 09/09/23 0959 5' 8 (1.727 m)     Head Circumference --      Peak Flow --      Pain Score 09/09/23 0959 4     Pain Loc --      Pain Education --      Exclude from Growth Chart --     Most recent vital signs: Vitals:   09/09/23 0958  BP: (!) 158/79  Pulse: 89  Resp: 19  Temp: 98.2 F (36.8 C)  SpO2: 98%    General: Awake, no distress.  CV:  Good peripheral perfusion.  Regular rate and rhythm  Resp:  Normal effort.  Equal breath sounds bilaterally.   Abd:  No distention.  Soft, nontender.  No rebound or guarding.  ED Results / Procedures / Treatments   EKG  EKG viewed and interpreted by myself shows a normal sinus rhythm at 86 bpm with a narrow QRS, normal axis, normal intervals, no concerning ST changes.  RADIOLOGY  I have reviewed interpret the chest x-ray images.  No consolidation on my evaluation. Radiology is read the x-ray as negative.   MEDICATIONS ORDERED IN ED: Medications  sodium chloride  0.9 % bolus 1,000 mL (has no administration in time range)     IMPRESSION / MDM / ASSESSMENT AND PLAN / ED COURSE  I reviewed the triage vital signs and the nursing notes.  Patient's presentation is most consistent with acute presentation with potential threat to life or bodily function.  Patient presents emergency department for orthostatic symptoms.  Patient states over the last couple days he has been getting dizzy/lightheaded upon standing which is atypical for him.  Patient states yesterday he noted some slight chest discomfort states it was mild.  No chest discomfort today.  Overall the patient appears well, no distress, reassuring physical exam.  Will check labs including cardiac enzymes x 2 given the patient's history.  Will obtain a chest x-ray.  Patient's EKG is reassuring.  EKG reassuring, chest x-ray is clear.  Patient CBC and chemistry show no concerning findings besides slight hyperglycemia.  Patient's troponin is normal x 1.  Repeat troponin pending.  Will also IV hydrate with 1 L of normal saline given his somewhat orthostatic symptoms.  Patient is feeling better, remains chest pain-free.  Repeat troponin remains negative.  Given the patient's reassuring workup I discussed with the patient to increase oral hydration of the next couple days.  Follow-up with his doctor in the next Avril days for recheck/reevaluation.  Provided my typical chest pain return precautions  FINAL CLINICAL IMPRESSION(S) / ED DIAGNOSES   Chest  pain Orthostatic   Note:  This document was prepared using Dragon voice recognition software and may include unintentional dictation errors.   Dorothyann Drivers, MD 09/09/23 1347

## 2023-09-17 ENCOUNTER — Other Ambulatory Visit: Payer: Self-pay | Admitting: Family Medicine

## 2023-09-17 MED ORDER — FREESTYLE LIBRE 3 PLUS SENSOR MISC: 3 refills | Status: AC

## 2023-09-17 NOTE — Telephone Encounter (Signed)
 Copied from CRM [id number]. Topic: Clinical - Medication Refill >> Sep 17, 2023  8:52 AM Suzen RAMAN wrote: Medication: Continuous Glucose Sensor (FREESTYLE LIBRE 3 PLUS SENSOR) MISC  Has the patient contacted their pharmacy? Yes  [facility] - [place], [place] - [address]  [address] [place] [address]  Phone: [phone] Fax: [phone]  Hours: Not open 24 hours    Is this the correct pharmacy for this prescription? Yes If no, delete pharmacy and type the correct one.   Has the prescription been filled recently? No  Is the patient out of the medication? Yes  Has the patient been seen for an appointment in the last year OR does the patient have an upcoming appointment? Yes  Can we respond through MyChart? Yes  Agent: Please be advised that Rx refills may take up to 3 business days. We ask that you follow-up with your pharmacy.

## 2023-09-17 NOTE — Telephone Encounter (Unsigned)
 Copied from CRM [id number]. Topic: Clinical - Medication Refill >> Sep 17, 2023  9:01 AM Suzen RAMAN wrote: Medication: FreeStyle Libre 2 Plus sensor  Has the patient contacted their pharmacy? Yes   This is the patient's preferred pharmacy:  [id number] [place], [place] - [address] AT [facility] OF [address] [place] [address] Phone: [phone] Fax: [phone]  Is this the correct pharmacy for this prescription? Yes If no, delete pharmacy and type the correct one.   Has the prescription been filled recently? No  Is the patient out of the medication? Yes  Has the patient been seen for an appointment in the last year OR does the patient have an upcoming appointment? Yes  Can we respond through MyChart? Yes  Agent: Please be advised that Rx refills may take up to 3 business days. We ask that you follow-up with your pharmacy.

## 2023-09-20 ENCOUNTER — Other Ambulatory Visit: Payer: Self-pay | Admitting: Family Medicine

## 2023-09-20 DIAGNOSIS — I10 Essential (primary) hypertension: Secondary | ICD-10-CM | POA: Diagnosis not present

## 2023-09-20 DIAGNOSIS — I739 Peripheral vascular disease, unspecified: Secondary | ICD-10-CM | POA: Diagnosis not present

## 2023-09-20 DIAGNOSIS — I25118 Atherosclerotic heart disease of native coronary artery with other forms of angina pectoris: Secondary | ICD-10-CM | POA: Diagnosis not present

## 2023-09-21 ENCOUNTER — Telehealth: Payer: Self-pay | Admitting: Cardiovascular Disease

## 2023-09-21 ENCOUNTER — Telehealth: Payer: Self-pay

## 2023-09-21 ENCOUNTER — Ambulatory Visit: Payer: Self-pay | Admitting: Cardiovascular Disease

## 2023-09-21 LAB — LIPID PANEL
Chol/HDL Ratio: 2.3 ratio (ref 0.0–5.0)
Cholesterol, Total: 109 mg/dL (ref 100–199)
HDL: 48 mg/dL (ref >39)
LDL Chol Calc (NIH): 41 mg/dL (ref 0–99)
Triglycerides: 109 mg/dL (ref 0–149)
VLDL Cholesterol Cal: 20 mg/dL (ref 5–40)

## 2023-09-21 MED ORDER — REPATHA SURECLICK 140 MG/ML ~~LOC~~ SOAJ: 140.0000 mg | SUBCUTANEOUS | 9 refills | Status: AC

## 2023-09-21 NOTE — Telephone Encounter (Signed)
 Copied from CRM [id number]. Topic: Clinical - Prescription Issue >> Sep 21, 2023 10:49 AM Drema MATSU wrote: Reason for CRM: Patient stated that the pharmacy hasn't recieved it yet the FreeStyle Libre 2 Plus sensor and the Continuous Glucose Sensor (FREESTYLE LIBRE 3 PLUS SENSOR) MISC is supoose to go to [facility]. He is checking the status of request.

## 2023-09-21 NOTE — Telephone Encounter (Signed)
 Refills has been sent to the pharmacy.

## 2023-09-21 NOTE — Telephone Encounter (Signed)
 Left message to return call to our office.  Was sent to mail order 09/17/23 per chart.

## 2023-09-21 NOTE — Telephone Encounter (Signed)
*  STAT* If patient is at the pharmacy, call can be transferred to refill team.     1. Which medications need to be refilled? (please list name of each medication and dose if known)   Evolocumab  (REPATHA  SURECLICK) 140 MG/ML SOAJ       2. Would you like to learn more about the convenience, safety, & potential cost savings by using the [facility]? No       3. Are you open to using the [facility] (Type [facility]. ). No     4. Which pharmacy/location (including street and city if local pharmacy) is medication to be sent to? [facility] - [place], [place] - [address]      5. Do they need a 30 day or 90 day supply? 90 day  Pt would like to transfer medication to the above pharmacy permanently.

## 2023-09-22 MED ORDER — FREESTYLE LIBRE 3 PLUS SENSOR MISC
1 refills | Status: DC
Start: 2023-09-22 — End: 2023-09-22

## 2023-09-22 MED ORDER — FREESTYLE LIBRE 3 PLUS SENSOR MISC: 1 refills | Status: AC

## 2023-09-22 NOTE — Addendum Note (Signed)
 Addended by: Damare Serano on: 09/22/2023 10:38 AM   Modules accepted: Orders

## 2023-09-22 NOTE — Telephone Encounter (Addendum)
 E-scribed FreeStyle 3+ sensors to [employer].   Spoke with pt relaying info above. Pt expresses his thanks and also requested reader. I explained there is no reader for the 3+ version and that he will need to download the app. Pt verbalizes understanding. States he will ask the pharmacist to help with that when he picks up the sensors.

## 2023-09-22 NOTE — Telephone Encounter (Unsigned)
 Copied from CRM #[id number]. Topic: Clinical - Prescription Issue >> Sep 22, 2023  8:28 AM Robinson H wrote: Reason for CRM: Patient is returning call to clinic regarding Freestyle Libre device, states the device was supposed to be sent to [facility] and the [facility], please reach out.  Brycin [phone]

## 2024-01-10 ENCOUNTER — Encounter: Payer: Self-pay | Admitting: General Practice

## 2024-01-10 ENCOUNTER — Ambulatory Visit: Payer: Self-pay

## 2024-01-10 ENCOUNTER — Telehealth: Payer: Self-pay | Admitting: Family Medicine

## 2024-01-10 ENCOUNTER — Ambulatory Visit (INDEPENDENT_AMBULATORY_CARE_PROVIDER_SITE_OTHER): Admitting: General Practice

## 2024-01-10 VITALS — BP 118/62 | HR 81 | Temp 98.0°F | Ht 68.0 in | Wt 195.0 lb

## 2024-01-10 DIAGNOSIS — Z794 Long term (current) use of insulin: Secondary | ICD-10-CM | POA: Diagnosis not present

## 2024-01-10 DIAGNOSIS — E1169 Type 2 diabetes mellitus with other specified complication: Secondary | ICD-10-CM | POA: Diagnosis not present

## 2024-01-10 DIAGNOSIS — L989 Disorder of the skin and subcutaneous tissue, unspecified: Secondary | ICD-10-CM | POA: Diagnosis not present

## 2024-01-10 NOTE — Telephone Encounter (Signed)
This was addressed at appt today

## 2024-01-10 NOTE — Telephone Encounter (Signed)
 Copied from CRM #[id number]. Topic: Referral - Request for Referral >> Jan 10, 2024  8:46 AM Willma R wrote: Did the patient discuss referral with their provider in the last year? Yes  Appointment offered? No  Type of order/referral and detailed reason for visit: [facility] but is unhappy with how they are providing treatment.   Preference of office, provider, location: Endocrinologist, open for suggestions on who to see.   If referral order, have you been seen by this specialty before? Yes Sees one at the [facility]  Can we respond through MyChart? Yes

## 2024-01-10 NOTE — Progress Notes (Signed)
 Established Patient Office Visit  Subjective   Patient ID: DARRYLE DENNIE, male    DOB: December 04, 1940  Age: 83 y.o. MRN: [id number]  Chief Complaint  Patient presents with   Diabetes    Patient states his blood sugars have been up and down. Patient wants referral to Endo.    Sore    On left foot x 3 weeks. Patient states its a red spot that's not open and not sore until he walks a lot. Patient does not have a hx of skin cancers but has had pre cancerous spots taken off.     Diabetes Pertinent negatives for hypoglycemia include no dizziness, headaches or nervousness/anxiousness. Pertinent negatives for diabetes include no chest pain and no polydipsia.    HILBERTO BURZYNSKI is a 83 year old male, patient of Dr. Rilla, presents today for an acute visit to discuss diabetes and sore on left foot.   Discussed the use of AI scribe software for clinical note transcription with the patient, who gave verbal consent to proceed.  History of Present Illness TRELON PLUSH is an 83 year old male with type 2 diabetes who presents for a referral to a endocrinologist.  He has been managing type 2 diabetes for over ten years, primarily through the [facility] system, but is seeking a referral to a endocrinologist.His A1c levels were stable around 7.4% until last year, but he has recently experienced fluctuations, including nocturnal hypoglycemia. He is awakened by his glucose sensor at night, with a recent reading of 65 mg/dL, which he corrected with a 6-ounce Coke, causing his glucose to spike to 180 mg/dL. By morning, his glucose level was 150 mg/dL. He is currently on NovoLog  70/30, taking 36 units in the morning and 33 units in the evening.  He has not communicated with his [facility] provider in the last four months and has not seen a diabetic specialist at the [facility] in three years.   He reports a red spot on his left foot, which has been present for three to four weeks and was first noticed by his wife last  week. It is not a sore but has been sore to touch. There is no itching, drainage or swelling.     Patient Active Problem List   Diagnosis Date Noted   Obstructive sleep apnea of adult 05/27/2021   PTSD (post-traumatic stress disorder) 04/12/2021   History of agent Orange exposure 04/12/2021   Peripheral neuropathy 04/30/2017   Advanced care planning/counseling discussion 02/06/2016   Mild carotid artery disease 10/19/2015   TIA (transient ischemic attack) 10/02/2015   Primary osteoarthritis of both knees 05/18/2014   Spondyloarthropathy 05/18/2014   Low back pain 05/10/2014   Type 2 diabetes mellitus with other specified complication (HCC) 12/01/2013   S/P total knee replacement 12/12/2012   Medicare annual wellness visit, initial 10/21/2010   ED (erectile dysfunction) 10/21/2010   HTN (hypertension)    Hyperlipidemia associated with type 2 diabetes mellitus (HCC)    CAD (coronary artery disease)    Past Medical History:  Diagnosis Date   Actinic keratosis    face   Arthritis    CAD (coronary artery disease) 08/2010   STEMI 08/2010 s/p 5v CABG at [facility], cardiac rehab - only did 7/26 sessions   Controlled type 2 diabetes mellitus with circulatory disorder (HCC) 2012   G[place]he   ED (erectile dysfunction)    likely medication induced   HLD (hyperlipidemia)    HTN (hypertension)    Hypertension  Mild carotid artery disease 10/19/2015   Mild plaque build-up by US  10/2015, monitor clinically    Pneumonia due to COVID-19 virus 02/25/2021   Spondyloarthropathy 05/2014   by Xray   Past Surgical History:  Procedure Laterality Date   CATARACT EXTRACTION  11/2010   CORONARY ARTERY BYPASS GRAFT  08/2010   5v, [facility]   ESOPHAGOGASTRODUODENOSCOPY  08/2010   normal esophagus, localized gastric erythema   TOTAL KNEE ARTHROPLASTY Left 08/2012   [facility] at [employer]   US  ECHOCARDIOGRAPHY  08/2010   mild LV dysfxn (EF 45%) with severe LVH, nl R vent sys fxn   No Known Allergies        01/10/2024   12:05 PM 06/01/2022    9:17 AM 05/28/2021    2:51 PM  Depression screen PHQ 2/9  Decreased Interest 0 0 0  Down, Depressed, Hopeless 0 0 0  PHQ - 2 Score 0 0 0  Altered sleeping 0    Tired, decreased energy 0    Change in appetite 0    Feeling bad or failure about yourself  0    Trouble concentrating 0    Moving slowly or fidgety/restless 0    Suicidal thoughts 0    PHQ-9 Score 0    Difficult doing work/chores Not difficult at all         01/10/2024   12:06 PM  GAD 7 : Generalized Anxiety Score  Nervous, Anxious, on Edge 0  Control/stop worrying 0  Worry too much - different things 0  Trouble relaxing 0  Restless 0  Easily annoyed or irritable 0  Afraid - awful might happen 0  Total GAD 7 Score 0  Anxiety Difficulty Not difficult at all      Review of Systems  Constitutional:  Negative for chills and fever.  Respiratory:  Negative for shortness of breath.   Cardiovascular:  Negative for chest pain.  Gastrointestinal:  Negative for abdominal pain, constipation, diarrhea, heartburn, nausea and vomiting.  Genitourinary:  Negative for dysuria, frequency and urgency.  Neurological:  Negative for dizziness and headaches.  Endo/Heme/Allergies:  Negative for polydipsia.  Psychiatric/Behavioral:  Negative for depression and suicidal ideas. The patient is not nervous/anxious.       Objective:     BP 118/62   Pulse 81   Temp 98 F (36.7 C) (Oral)   Ht 5' 8 (1.727 m)   Wt 195 lb (88.5 kg)   SpO2 96%   BMI 29.65 kg/m  BP Readings from Last 3 Encounters:  01/10/24 118/62  09/09/23 (!) 158/79  06/18/23 (!) 150/80   Wt Readings from Last 3 Encounters:  01/10/24 195 lb (88.5 kg)  09/09/23 183 lb (83 kg)  06/18/23 191 lb (86.6 kg)      Physical Exam Vitals and nursing note reviewed.  Constitutional:      Appearance: Normal appearance.  Cardiovascular:     Rate and Rhythm: Normal rate and regular rhythm.     Pulses: Normal pulses.     Heart  sounds: Normal heart sounds.  Pulmonary:     Effort: Pulmonary effort is normal.     Breath sounds: Normal breath sounds.  Musculoskeletal:       Feet:  Feet:     Right foot:     Toenail Condition: Right toenails are abnormally thick. Fungal disease present.    Left foot:     Skin integrity: Dry skin present. No skin breakdown.     Toenail Condition: Left toenails  are abnormally thick. Fungal disease present. Neurological:     Mental Status: He is alert and oriented to person, place, and time.  Psychiatric:        Mood and Affect: Mood normal.        Behavior: Behavior normal.        Thought Content: Thought content normal.        Judgment: Judgment normal.      No results found for any visits on 01/10/24.     The ASCVD Risk score (Arnett DK, et al., 2019) failed to calculate for the following reasons:   The 2019 ASCVD risk score is only valid for ages 25 to 64    Assessment & Plan:  Type 2 diabetes mellitus with other specified complication, with long-term current use of insulin  [facility]) -     Ambulatory referral to Endocrinology -     Ambulatory referral to Podiatry  Skin lesion of foot -     Ambulatory referral to Podiatry    Assessment and Plan Assessment & Plan Type 2 diabetes mellitus with hypoglycemia Recent nocturnal hypoglycemia with fluctuating glucose levels. Current insulin  regimen may require adjustment. A1c at 7.4%. - Decreased evening insulin  dose to 30 units at supper. - Follow up with Dr. Rilla in two weeks to monitor blood glucose levels. - Referred to endocrinology for further diabetes management. - Continue close monitoring of blood glucose levels with sensor.  Skin lesion of left foot Red spot on left foot for 3-4 weeks, not a diabetic ulcer.  - No red flags one exam. No fever, chills, swelling, drainage present. - Referred to podiatry for evaluation of left foot lesion and toenail care.   Return in about 2 weeks (around 01/24/2024) for  follow up on DM.SABRA Carrol Aurora, NP

## 2024-01-10 NOTE — Telephone Encounter (Signed)
 Appreciate Bableen seeing this patient.

## 2024-01-10 NOTE — Patient Instructions (Addendum)
 Decrease evening insulin  to 30 units at supper.   You will either be contacted via phone regarding your referral to podiatry and endocrinology , or you may receive a letter on your MyChart portal from our referral team with instructions for scheduling an appointment. Please let us  know if you have not been contacted by anyone within two weeks.   F/u in 2 weeks with Dr. KANDICE.   It was a pleasure to see you today!

## 2024-01-10 NOTE — Telephone Encounter (Signed)
 Noted.  Will evaluate.

## 2024-01-10 NOTE — Telephone Encounter (Signed)
 FYI Only or Action Required?: FYI only for provider: appointment scheduled on 01/10/2024 at 12pm with Carrol Aurora NP.  Patient was last seen in primary care on 01/13/2023 by Rilla Baller, MD.  Called Nurse Triage reporting Blood Sugar Problem.  Symptoms began several weeks ago.  Interventions attempted: Rest, hydration, or home remedies.  Symptoms are: gradually worsening.  Triage Disposition: See Physician Within 24 Hours  Patient/caregiver understands and will follow disposition?: Yes             Copied from CRM #[id number]. Topic: Clinical - Red Word Triage >> Jan 10, 2024  8:52 AM Willma R wrote: [facility] that prompted transfer to Nurse Triage: Patient states he took his blood sugar around 3am this morning and it was 65, drank a 6 oz coke and it jumped to 180. Patient also has some pain in his left heal for the past 2-3 weeks, thought it was bruised but his wife said it was red with a red dot in the middle. Patient has also gained 5 lbs in 3 days. Reason for Disposition  [1] Ulcer, wound, blister, sore, or red area AND [2] new or increasing  Answer Assessment - Initial Assessment Questions Patient is diabetic Patient states he takes Insulin  36 units in the morning and 32 units at night Blood sugar is constantly either very high or very low Patient states he has a sensor that alerts him with readings are high or low Last night it alerted him that he was in the 60s and he drank one coca cola and he jumped up to 180 States his average blood sugar had been 140 He also states he has gained 5 pounds in the past three days & hasn't changed anything with his diet During triage, blood sugar is 162 Patient denies chest pain or difficulty breathing Also, recently, at the bottom of his left foot there is a spot that it really red and a circle in the center of his foot. Sometimes it hurts when he walks.--this red spot was found about three weeks ago  Patient is advised to  call us  back if anything changes or with any further questions/concerns. Patient is advised that if anything worsens to go to the Emergency Room. Patient verbalized understanding.  Protocols used: Diabetes - Foot Problems and Questions-A-AH

## 2024-01-17 ENCOUNTER — Ambulatory Visit

## 2024-01-18 ENCOUNTER — Ambulatory Visit: Admitting: Dermatology

## 2024-01-18 ENCOUNTER — Encounter: Payer: Self-pay | Admitting: Dermatology

## 2024-01-18 DIAGNOSIS — L820 Inflamed seborrheic keratosis: Secondary | ICD-10-CM | POA: Diagnosis not present

## 2024-01-18 DIAGNOSIS — L720 Epidermal cyst: Secondary | ICD-10-CM | POA: Diagnosis not present

## 2024-01-18 DIAGNOSIS — L821 Other seborrheic keratosis: Secondary | ICD-10-CM

## 2024-01-18 DIAGNOSIS — L570 Actinic keratosis: Secondary | ICD-10-CM

## 2024-01-18 DIAGNOSIS — W908XXA Exposure to other nonionizing radiation, initial encounter: Secondary | ICD-10-CM

## 2024-01-18 DIAGNOSIS — L578 Other skin changes due to chronic exposure to nonionizing radiation: Secondary | ICD-10-CM

## 2024-01-18 NOTE — Progress Notes (Signed)
 Follow-Up Visit   Subjective  William Walton is a 83 y.o. male who presents for the following: growth R lower eyelid, growing  The patient has spots, moles and lesions to be evaluated, some may be new or changing and the patient may have concern these could be cancer.  The following portions of the chart were reviewed this encounter and updated as appropriate: medications, allergies, medical history  Review of Systems:  No other skin or systemic complaints except as noted in HPI or Assessment and Plan.  Objective  Well appearing patient in no apparent distress; mood and affect are within normal limits.   A focused examination was performed of the following areas: face  Relevant exam findings are noted in the Assessment and Plan.  R infra ocular   Scalp, face x 8, arms, hands x 23 (31) Stuck on waxy paps with erythema scalp, ears x 10 (10) Pink scaly macules  Assessment & Plan   MILIUM R infra ocular /eyelid Exam: 2.52mm tiny firm white papule, R infra ocular Treatment: Discussed extraction today, pt declines Observe   INFLAMED SEBORRHEIC KERATOSIS (31) Scalp, face x 8, arms, hands x 23 (31) Symptomatic, irritating, patient would like treated. Destruction of lesion - Scalp, face x 8, arms, hands x 23 (31) Complexity: simple   Destruction method: cryotherapy   Informed consent: discussed and consent obtained   Timeout:  patient name, date of birth, surgical site, and procedure verified Lesion destroyed using liquid nitrogen: Yes   Region frozen until ice ball extended beyond lesion: Yes   Outcome: patient tolerated procedure well with no complications   Post-procedure details: wound care instructions given    AK (ACTINIC KERATOSIS) (10) scalp, ears x 10 (10) Actinic keratoses are precancerous spots that appear secondary to cumulative UV radiation exposure/sun exposure over time. They are chronic with expected duration over 1 year. A portion of actinic keratoses will  progress to squamous cell carcinoma of the skin. It is not possible to reliably predict which spots will progress to skin cancer and so treatment is recommended to prevent development of skin cancer.  Recommend daily broad spectrum sunscreen SPF 30+ to sun-exposed areas, reapply every 2 hours as needed.  Recommend staying in the shade or wearing long sleeves, sun glasses (UVA+UVB protection) and wide brim hats (4-inch brim around the entire circumference of the hat). Call for new or changing lesions. Destruction of lesion - scalp, ears x 10 (10) Complexity: simple   Destruction method: cryotherapy   Informed consent: discussed and consent obtained   Timeout:  patient name, date of birth, surgical site, and procedure verified Lesion destroyed using liquid nitrogen: Yes   Region frozen until ice ball extended beyond lesion: Yes   Outcome: patient tolerated procedure well with no complications   Post-procedure details: wound care instructions given    ACTINIC SKIN DAMAGE   SEBORRHEIC KERATOSIS   MILIUM CYST    ACTINIC DAMAGE - chronic, secondary to cumulative UV radiation exposure/sun exposure over time - diffuse scaly erythematous macules with underlying dyspigmentation - Recommend daily broad spectrum sunscreen SPF 30+ to sun-exposed areas, reapply every 2 hours as needed.  - Recommend staying in the shade or wearing long sleeves, sun glasses (UVA+UVB protection) and wide brim hats (4-inch brim around the entire circumference of the hat). - Call for new or changing lesions.   SEBORRHEIC KERATOSIS - Stuck-on, waxy, tan-brown papules and/or plaques  - Benign-appearing - Discussed benign etiology and prognosis. - Observe - Call for  any changes  Return in about 8 months (around 09/17/2024) for UBSE, Hx of AKs.  I, Grayce Saunas, RMA, am acting as scribe for Alm Rhyme, MD .   Documentation: I have reviewed the above documentation for accuracy and completeness, and I agree with  the above.  Alm Rhyme, MD

## 2024-01-18 NOTE — Patient Instructions (Addendum)

## 2024-01-24 ENCOUNTER — Encounter: Payer: Self-pay | Admitting: Family Medicine

## 2024-01-24 ENCOUNTER — Ambulatory Visit: Admitting: Family Medicine

## 2024-01-24 VITALS — BP 132/74 | HR 73 | Temp 97.9°F | Ht 68.0 in | Wt 193.0 lb

## 2024-01-24 DIAGNOSIS — Z794 Long term (current) use of insulin: Secondary | ICD-10-CM | POA: Diagnosis not present

## 2024-01-24 DIAGNOSIS — E1169 Type 2 diabetes mellitus with other specified complication: Secondary | ICD-10-CM | POA: Diagnosis not present

## 2024-01-24 DIAGNOSIS — E785 Hyperlipidemia, unspecified: Secondary | ICD-10-CM | POA: Diagnosis not present

## 2024-01-24 LAB — POCT GLYCOSYLATED HEMOGLOBIN (HGB A1C): Hemoglobin A1C: 7.7 % — AB (ref 4.0–5.6)

## 2024-01-24 NOTE — Progress Notes (Unsigned)
 Ph: [phone] Fax: [phone]   Patient ID: William Walton, male    DOB: 16-Oct-1940, 83 y.o.   MRN: [id number]  This visit was conducted in person.  BP 132/74 (Cuff Size: Normal)   Pulse 73   Temp 97.9 F (36.6 C) (Oral)   Ht 5' 8 (1.727 m)   Wt 193 lb (87.5 kg)   SpO2 97%   BMI 29.35 kg/m    CC: DM f/u Subjective:   HPI: William Walton is a 83 y.o. male presenting on 01/24/2024 for Medical Management of Chronic Issues ([facility], pt is concerned about is sugars running high even with diet and insulin )   [facility] - felt this was caused by Agent Orange exposure. Only saw [facility] end[facility]inology once in 2019. Previously to this saw Dr Trixie 2019 at [facility]. Requests return to endocrinology - needs new referral for this.   More recently he's noticing fluctuating sugar control with occasional lows early morning and highs in evenings.  Noted sore to L foot sole - that has subsequently healed.   DM - does regularly check sugars: see below. Compliant with antihyperglycemic regimen which includes: novolog  70/30 36/33u daily via flexpen. Did not tolerate metformin  remotely tried - does not want to try this again. On same insulin  regimen for the past 6 years. Denies hypoglycemic symptoms. Denies paresthesias, blurry vision. Last diabetic eye exam 07/2023. Glucometer brand: FL3+. Last foot exam: 12/2022 - [facility]. DSME: completed remotely 2019.  CGM data: FL3+  30 day average sugar: 148, time in range 57%, hypoglycemia 1%, stage 1 hyperglycemia 40%, stage 2 hyperglycemia 2%.  Lab Results  Component Value Date   HGBA1C 7.7 (A) 01/24/2024   Diabetic Foot Exam - Simple   Simple Foot Form Diabetic Foot exam was performed with the following findings: Yes 01/24/2024  3:54 PM  Visual Inspection See comments: Yes Sensation Testing Intact to touch and monofilament testing bilaterally: Yes Pulse  Check Posterior Tibialis and Dorsalis pulse intact bilaterally: Yes Comments No claudication Thickened nails    No results found for: MACKEY CURRENT       Relevant past medical, surgical, family and social history reviewed and updated as indicated. Interim medical history since our last visit reviewed. Allergies and medications reviewed and updated. Outpatient Medications Prior to Visit  Medication Sig Dispense Refill   aspirin  81 MG EC tablet Take 1 tablet (81 mg total) by mouth daily. Swallow whole.     Continuous Glucose Sensor (FREESTYLE LIBRE 3 PLUS SENSOR) MISC E11.69 on daily insulin . Change sensor every 15 days. 6 each 3   Continuous Glucose Sensor (FREESTYLE LIBRE 3 PLUS SENSOR) MISC Use to check blood sugar continuously. Change sensor every 15 days. 6 each 1   Evolocumab  (REPATHA  SURECLICK) 140 MG/ML SOAJ Inject 140 mg into the skin every 14 (fourteen) days. 2 mL 9   insulin  aspart protamine - aspart (NOVOLOG  MIX 70/30 FLEXPEN) (70-30) 100 UNIT/ML FlexPen Inject 36 Units into the skin daily with breakfast AND 33 Units daily with supper. through [facility].     Insulin  Pen Needle (EASY TOUCH PEN NEEDLES) 31G X 6 MM MISC USE TO INJECT INSULIN  TWICE A DAY 200 each 3   ipratropium (ATROVENT ) 0.06 % nasal spray Place 2 sprays into both nostrils 3 (three) times daily. As needed for nasal congestion, runny nose (Patient not taking: Reported on 01/18/2024) 15  mL 1   No facility-administered medications prior to visit.     Per HPI unless specifically indicated in ROS section below Review of Systems  Objective:  BP 132/74 (Cuff Size: Normal)   Pulse 73   Temp 97.9 F (36.6 C) (Oral)   Ht 5' 8 (1.727 m)   Wt 193 lb (87.5 kg)   SpO2 97%   BMI 29.35 kg/m   Wt Readings from Last 3 Encounters:  01/24/24 193 lb (87.5 kg)  01/10/24 195 lb (88.5 kg)  09/09/23 183 lb (83 kg)      Physical Exam Vitals and nursing note reviewed.  Constitutional:      Appearance: Normal  appearance. He is not ill-appearing.  HENT:     Mouth/Throat:     Mouth: Mucous membranes are moist.     Pharynx: Oropharynx is clear. No oropharyngeal exudate or posterior oropharyngeal erythema.  Eyes:     Extraocular Movements: Extraocular movements intact.     Conjunctiva/sclera: Conjunctivae normal.     Pupils: Pupils are equal, round, and reactive to light.  Cardiovascular:     Rate and Rhythm: Normal rate and regular rhythm.     Pulses: Normal pulses.     Heart sounds: Normal heart sounds. No murmur heard. Pulmonary:     Effort: Pulmonary effort is normal. No respiratory distress.     Breath sounds: Normal breath sounds. No wheezing, rhonchi or rales.  Musculoskeletal:     Right lower leg: No edema.     Left lower leg: No edema.     Comments: See HPI for foot exam if done  Skin:    General: Skin is warm and dry.     Findings: No rash.  Neurological:     Mental Status: He is alert.  Psychiatric:        Mood and Affect: Mood normal.        Behavior: Behavior normal.       Results for orders placed or performed in visit on 01/24/24  HgB A1c   Collection Time: 01/24/24  3:20 PM  Result Value Ref Range   Hemoglobin A1C 7.7 (A) 4.0 - 5.6 %   HbA1c POC (<> result, manual entry)     HbA1c, POC (prediabetic range)     HbA1c, POC (controlled diabetic range)      Assessment & Plan:   Problem List Items Addressed This Visit     Hyperlipidemia associated with type 2 diabetes mellitus (HCC)   Has declined statins. He continues repatha  Q2 wks through cardiology office.       Type 2 diabetes mellitus with other specified complication (HCC) - Primary   Chronic, traditionally managed by [facility] but desires to establish with local endocrinologist - referral placed.  Current regimen is novolog  70/30 36/33u daily via flexpen.  He has h/o intolerance to metformin  and desires to stay off oral medication.  Diabetes associated with hyperlipidemia, CAD, hypertension.       Relevant  Orders   HgB A1c (Completed)   Ambulatory referral to Endocrinology     No orders of the defined types were placed in this encounter.   Orders Placed This Encounter  Procedures   Ambulatory referral to Endocrinology    Referral Priority:   Routine    Referral Type:   Consultation    Referral Reason:   Specialty Services Required    Number of Visits Requested:   1   HgB A1c    Patient Instructions  I will place  new referral back to [facility] endocrinology for diabetes management.  Good to see you today Return as needed or for previously scheduled physical   Follow up plan: Return if symptoms worsen or fail to improve.  Anton Blas, MD

## 2024-01-24 NOTE — Patient Instructions (Addendum)
 I will place new referral back to [facility] endocrinology for diabetes management.  Good to see you today Return as needed or for previously scheduled physical

## 2024-01-25 NOTE — Assessment & Plan Note (Addendum)
 Chronic, traditionally managed by [facility] but desires to establish with local endocrinologist - referral placed.  Current regimen is novolog  70/30 36/33u daily via flexpen.  He has h/o intolerance to metformin  and desires to stay off oral medication.  Diabetes associated with hyperlipidemia, CAD, hypertension.

## 2024-01-25 NOTE — Assessment & Plan Note (Addendum)
 Has declined statins. He continues repatha  Q2 wks through cardiology office.

## 2024-01-28 ENCOUNTER — Encounter: Admitting: Family Medicine

## 2024-02-23 ENCOUNTER — Ambulatory Visit

## 2024-02-24 ENCOUNTER — Ambulatory Visit

## 2024-02-24 DIAGNOSIS — G63 Polyneuropathy in diseases classified elsewhere: Secondary | ICD-10-CM

## 2024-02-24 DIAGNOSIS — E1169 Type 2 diabetes mellitus with other specified complication: Secondary | ICD-10-CM

## 2024-02-24 DIAGNOSIS — L989 Disorder of the skin and subcutaneous tissue, unspecified: Secondary | ICD-10-CM | POA: Diagnosis not present

## 2024-02-24 DIAGNOSIS — Z794 Long term (current) use of insulin: Secondary | ICD-10-CM | POA: Diagnosis not present

## 2024-02-24 DIAGNOSIS — B351 Tinea unguium: Secondary | ICD-10-CM | POA: Diagnosis not present

## 2024-02-24 NOTE — Progress Notes (Signed)
"  °  Subjective:  Patient ID: William Walton, male    DOB: 04-Oct-1940,  MRN: [id number]  84 y.o. male presents with chief concern of diabetes with elongated, thickened, painful, discolored toenails for months. Aggravating factor(s) include movement and palpation. Patient has tried self attempt at trimming toenail. He relates to a small lesion on his right heel that has healed and is no longer painful. He has a skin lesion to his right foot plantar 5th met head.   Chief Complaint  Patient presents with   Diabetes    DM. A1c 7.7 he has a spot on his right heel. It started out the size of a 50 cent piece but has gotten smaller over time.     PCP is Rilla Baller, MD   Allergies[1]  Review of Systems: Negative except as noted in the HPI.   Objective:  BARTH TRELLA is a pleasant 84 y.o. male in NAD. AAO x 3.  Vascular Examination: Vascular status intact b/l with palpable pedal pulses. CFT immediate b/l. Pedal hair present. No edema. No pain with calf compression b/l. Skin temperature gradient WNL b/l. No varicosities noted. No cyanosis or clubbing noted.  Neurological Examination: Sensation grossly intact b/l with 10 gram monofilament. Negative tinel sign at tarsal tunnel bilaterally.   Dermatological Examination: Pedal skin with normal turgor, texture and tone b/l. No open wounds nor interdigital macerations noted. Toenails 1-5 b/l thick, discolored, elongated with subungual debris and pain on dorsal palpation. Plantar to 5th metatarsal head right is hyperkeratotic lesion with central core, minimal pain to palpation. No heel lesion.   Musculoskeletal Examination: Muscle strength 5/5 to b/l LE.  No pain, crepitus noted b/l. No gross pedal deformities. Patient ambulates independently without assistive aids.   Radiographs: None Last A1c:      Latest Ref Rng & Units 01/24/2024    3:20 PM 06/18/2023   11:31 AM  Hemoglobin A1C  Hemoglobin-A1c 4.0 - 5.6 % 7.7  7.6      Assessment:   1.  Polyneuropathy associated with underlying disease   2. Onychomycosis   3. Benign skin lesion   4. Type 2 diabetes mellitus with other specified complication, with long-term current use of insulin  (HCC)     Plan:  Diabetic foot examination performed today. All patient's and/or POA's questions/concerns addressed on today's visit. Toenails 1-5 b/l debrided in length and girth without incident. Continue foot and shoe inspections daily. Monitor blood glucose per PCP/Endocrinologist's recommendations. Continue soft, supportive shoe gear daily. Report any pedal injuries to medical professional. Call office if there are any questions/concerns. -Patient/POA to call should there be question/concern in the interim. - Hyperkeratotic lesion debrided sharply utilizing 15 blade without incident. Patient expressed satisfaction - RTC 3 months with Dr. Gaynel  No follow-ups on file.  Prentice Ovens, DPM AACFAS [facility] Trained Podiatric Surgeon [facility]       [place] LOCATION: [address], [place] [address]                   Office [phone]   [facility] LOCATION: [address] [place], [place] [address] Office [phone]     [1] No Known Allergies  "

## 2024-03-03 ENCOUNTER — Encounter: Admitting: Family Medicine

## 2024-03-07 ENCOUNTER — Other Ambulatory Visit: Payer: Self-pay | Admitting: Family Medicine

## 2024-03-07 DIAGNOSIS — E1169 Type 2 diabetes mellitus with other specified complication: Secondary | ICD-10-CM

## 2024-04-17 ENCOUNTER — Encounter: Admitting: Family Medicine

## 2024-05-25 ENCOUNTER — Ambulatory Visit: Admitting: Podiatry

## 2024-09-19 ENCOUNTER — Ambulatory Visit: Admitting: Dermatology
